# Patient Record
Sex: Male | Born: 1950 | Race: White | Hispanic: No | Marital: Single | Smoking: Current every day smoker
Health system: Southern US, Community
[De-identification: ages and names within clinical notes are randomized; demographics above are authoritative.]

## PROBLEM LIST (undated history)

## (undated) DIAGNOSIS — R109 Unspecified abdominal pain: Secondary | ICD-10-CM

## (undated) DIAGNOSIS — C259 Malignant neoplasm of pancreas, unspecified: Secondary | ICD-10-CM

## (undated) DIAGNOSIS — C801 Malignant (primary) neoplasm, unspecified: Secondary | ICD-10-CM

## (undated) DIAGNOSIS — R0600 Dyspnea, unspecified: Secondary | ICD-10-CM

## (undated) DIAGNOSIS — K8689 Other specified diseases of pancreas: Secondary | ICD-10-CM

## (undated) DIAGNOSIS — A150 Tuberculosis of lung: Secondary | ICD-10-CM

## (undated) DIAGNOSIS — R7303 Prediabetes: Secondary | ICD-10-CM

## (undated) HISTORY — PX: COLONOSCOPY W/ POLYPECTOMY: SHX1380

## (undated) HISTORY — DX: Malignant neoplasm of pancreas, unspecified: C25.9

## (undated) HISTORY — PX: LEG SURGERY: SHX1003

---

## 2008-06-07 ENCOUNTER — Emergency Department (HOSPITAL_COMMUNITY): Admission: EM | Admit: 2008-06-07 | Discharge: 2008-06-07 | Payer: Self-pay | Admitting: Emergency Medicine

## 2011-04-09 LAB — CBC
HCT: 39.7 % (ref 39.0–52.0)
Hemoglobin: 14 g/dL (ref 13.0–17.0)
MCHC: 35.4 g/dL (ref 30.0–36.0)
MCV: 97.6 fL (ref 78.0–100.0)
RBC: 4.06 MIL/uL — ABNORMAL LOW (ref 4.22–5.81)
WBC: 9.9 10*3/uL (ref 4.0–10.5)

## 2011-04-09 LAB — BASIC METABOLIC PANEL
CO2: 26 mEq/L (ref 19–32)
Calcium: 8.7 mg/dL (ref 8.4–10.5)
Chloride: 94 mEq/L — ABNORMAL LOW (ref 96–112)
Creatinine, Ser: 0.57 mg/dL (ref 0.4–1.5)
GFR calc Af Amer: >60 mL/min (ref >60)
Glucose, Bld: 103 mg/dL — ABNORMAL HIGH (ref 70–99)

## 2011-04-09 LAB — DIFFERENTIAL
Basophils Relative: 0 % (ref 0–1)
Eosinophils Absolute: 0 10*3/uL (ref 0.0–0.7)
Eosinophils Relative: 0 % (ref 0–5)
Lymphs Abs: 1.2 10*3/uL (ref 0.7–4.0)
Monocytes Absolute: 0.9 10*3/uL (ref 0.1–1.0)
Monocytes Relative: 9 % (ref 3–12)

## 2011-04-09 LAB — RAPID URINE DRUG SCREEN, HOSP PERFORMED
Barbiturates: NOT DETECTED
Benzodiazepines: NOT DETECTED

## 2012-03-23 ENCOUNTER — Emergency Department (HOSPITAL_COMMUNITY): Payer: Medicaid Other

## 2012-03-23 ENCOUNTER — Encounter (HOSPITAL_COMMUNITY): Payer: Self-pay | Admitting: *Deleted

## 2012-03-23 ENCOUNTER — Emergency Department (HOSPITAL_COMMUNITY)
Admission: EM | Admit: 2012-03-23 | Discharge: 2012-03-23 | Disposition: A | Discharge status: Home / Self Care | Payer: Medicaid Other | Attending: Internal Medicine | Admitting: Internal Medicine

## 2012-03-23 DIAGNOSIS — J438 Other emphysema: Secondary | ICD-10-CM | POA: Insufficient documentation

## 2012-03-23 DIAGNOSIS — R05 Cough: Secondary | ICD-10-CM | POA: Insufficient documentation

## 2012-03-23 DIAGNOSIS — R042 Hemoptysis: Secondary | ICD-10-CM | POA: Insufficient documentation

## 2012-03-23 DIAGNOSIS — R059 Cough, unspecified: Secondary | ICD-10-CM | POA: Insufficient documentation

## 2012-03-23 HISTORY — DX: Tuberculosis of lung: A15.0

## 2012-03-23 LAB — CBC WITH DIFFERENTIAL/PLATELET
Basophils Absolute: 0 10*3/uL (ref 0.0–0.1)
HCT: 40.7 % (ref 39.0–52.0)
Lymphocytes Relative: 20 % (ref 12–46)
Monocytes Absolute: 0.5 10*3/uL (ref 0.1–1.0)
Neutro Abs: 6.1 10*3/uL (ref 1.7–7.7)
RBC: 4.28 MIL/uL (ref 4.22–5.81)
RDW: 13.8 % (ref 11.5–15.5)

## 2012-03-23 LAB — COMPREHENSIVE METABOLIC PANEL
ALT: 15 U/L (ref 0–53)
AST: 25 U/L (ref 0–37)
Alkaline Phosphatase: 63 U/L (ref 39–117)
CO2: 25 mEq/L (ref 19–32)
Chloride: 100 mEq/L (ref 96–112)
Creatinine, Ser: 0.76 mg/dL (ref 0.50–1.35)
GFR calc non Af Amer: >90 mL/min (ref >90)
Potassium: 4.3 mEq/L (ref 3.5–5.1)
Total Bilirubin: 0.3 mg/dL (ref 0.3–1.2)

## 2012-03-23 MED ORDER — IOHEXOL 300 MG/ML  SOLN
80.0000 mL | Freq: Once | INTRAMUSCULAR | Status: AC | PRN
  Administered 2012-03-23: 80 mL via INTRAVENOUS

## 2012-03-23 NOTE — ED Notes (Signed)
Pt departure condition needs to be back timed to 1534.

## 2012-03-23 NOTE — ED Notes (Addendum)
Coughing up blood intermittently since yesterday morning. Hx of previous TB in 2012

## 2012-03-23 NOTE — ED Provider Notes (Addendum)
History   This chart was scribed for Nicholas Lennert, MD by Sofie Rower. The patient was seen in room APA07/APA07 and the patient's care was started at 10:36AM    CSN: [id number]  Arrival date & time 03/23/12  1001   First MD Initiated Contact with Patient 03/23/12 1036      Chief Complaint  Patient presents with  . Hemoptysis    (Consider location/radiation/quality/duration/timing/severity/associated sxs/prior treatment) Patient is a 61 y.o. male presenting with cough. The history is provided by the patient. No language interpreter was used.  Cough This is a new problem. The current episode started yesterday. The problem occurs every few hours. The problem has been gradually worsening. The cough is productive of bloody sputum. There has been no fever. Pertinent negatives include no chest pain, no sweats, no headaches, no sore throat and no myalgias. He has tried nothing for the symptoms. The treatment provided no relief. He is a smoker.   The pt reports the last episode of TB the pt experienced was May, 2012.   Past Medical History  Diagnosis Date  . TB (pulmonary tuberculosis)     Past Surgical History  Procedure Date  . Leg surgery     No family history on file.  History  Substance Use Topics  . Smoking status: Current Every Day Smoker    Types: Cigarettes  . Smokeless tobacco: Not on file  . Alcohol Use: Yes     beer at night      Review of Systems  Constitutional: Negative for fatigue.  HENT: Negative for congestion, sore throat, sinus pressure and ear discharge.   Eyes: Negative for discharge.  Respiratory: Positive for cough.   Cardiovascular: Negative for chest pain.  Gastrointestinal: Negative for abdominal pain and diarrhea.  Genitourinary: Negative for frequency and hematuria.  Musculoskeletal: Negative for myalgias and back pain.  Skin: Negative for rash.  Neurological: Negative for seizures and headaches.  Hematological: Negative.     Psychiatric/Behavioral: Negative for hallucinations.    Allergies  Review of patient's allergies indicates no known allergies.  Home Medications  No current outpatient prescriptions on file.  BP 150/79  Pulse 91  Temp 98.4 F (36.9 C) (Oral)  Resp 20  SpO2 100%  Physical Exam  Nursing note and vitals reviewed. Constitutional: He is oriented to person, place, and time. He appears well-developed.  HENT:  Head: Normocephalic and atraumatic.  Eyes: Conjunctivae normal and EOM are normal. No scleral icterus.  Neck: Neck supple. No thyromegaly present.  Cardiovascular: Normal rate and regular rhythm.  Exam reveals no gallop and no friction rub.   No murmur heard. Pulmonary/Chest: No stridor. He has no wheezes. He has no rales. He exhibits no tenderness.  Abdominal: He exhibits no distension. There is no tenderness. There is no rebound.  Musculoskeletal: Normal range of motion. He exhibits no edema.  Lymphadenopathy:    He has no cervical adenopathy.  Neurological: He is oriented to person, place, and time. Cranial nerve deficit:  no gross defecits noted. Coordination normal.  Skin: No rash noted. No erythema.  Psychiatric: He has a normal mood and affect. His behavior is normal.    ED Course  Procedures (including critical care time)  DIAGNOSTIC STUDIES: Oxygen Saturation is 100% on room air, normal by my interpretation.    COORDINATION OF CARE:   11:00AM- Treatment plan discussed with patient. Pt agrees with treatment.   Results for orders placed during the hospital encounter of 03/23/12  CBC WITH DIFFERENTIAL  Component Value Range   WBC 8.4  4.0 - 10.5 K/uL   RBC 4.28  4.22 - 5.81 MIL/uL   Hemoglobin 14.3  13.0 - 17.0 g/dL   HCT 45.4  09.8 - 11.9 %   MCV 95.1  78.0 - 100.0 fL   MCH 33.4  26.0 - 34.0 pg   MCHC 35.1  30.0 - 36.0 g/dL   RDW 14.7  82.9 - 56.2 %   Platelets 215  150 - 400 K/uL   Neutrophils Relative 73  43 - 77 %   Neutro Abs 6.1  1.7 - 7.7  K/uL   Lymphocytes Relative 20  12 - 46 %   Lymphs Abs 1.7  0.7 - 4.0 K/uL   Monocytes Relative 6  3 - 12 %   Monocytes Absolute 0.5  0.1 - 1.0 K/uL   Eosinophils Relative 1  0 - 5 %   Eosinophils Absolute 0.1  0.0 - 0.7 K/uL   Basophils Relative 0  0 - 1 %   Basophils Absolute 0.0  0.0 - 0.1 K/uL  COMPREHENSIVE METABOLIC PANEL      Component Value Range   Sodium 133 (*) 135 - 145 mEq/L   Potassium 4.3  3.5 - 5.1 mEq/L   Chloride 100  96 - 112 mEq/L   CO2 25  19 - 32 mEq/L   Glucose, Bld 134 (*) 70 - 99 mg/dL   BUN 11  6 - 23 mg/dL   Creatinine, Ser 1.30  0.50 - 1.35 mg/dL   Calcium 9.2  8.4 - 86.5 mg/dL   Total Protein 7.3  6.0 - 8.3 g/dL   Albumin 3.6  3.5 - 5.2 g/dL   AST 25  0 - 37 U/L   ALT 15  0 - 53 U/L   Alkaline Phosphatase 63  39 - 117 U/L   Total Bilirubin 0.3  0.3 - 1.2 mg/dL   GFR calc non Af Amer >90  >90 mL/min   GFR calc Af Amer >90  >90 mL/min   Dg Chest 2 View  03/23/2012  *RADIOLOGY REPORT*  Clinical Data: Cough, active TB two years ago, hemoptysis.  CHEST - 2 VIEW  Comparison: None.  Findings: Large spiculated left upper lobe opacity, some of which may reflect chronic fibrosis, active infection or mass not excluded.  Small nodular/spiculated right upper lobe opacity.  Lucency involving the left lower lung.  Cardiomediastinal silhouette is within normal limits.  Degenerative changes of the visualized thoracolumbar spine.  IMPRESSION: Large spiculated left upper lobe opacity, as described above.  Small nodular/spiculated right upper lobe opacity.  Correlate with pending CT.   Original Report Authenticated By: Charline Bills, M.D.    Ct Chest W Contrast  03/23/2012  *RADIOLOGY REPORT*  Clinical Data: Hemoptysis.  CT CHEST WITH CONTRAST  Technique:  Multidetector CT imaging of the chest was performed following the standard protocol during bolus administration of intravenous contrast.  Contrast: 80mL OMNIPAQUE IOHEXOL 300 MG/ML  SOLN  Comparison: Chest x-ray  03/23/2012.  Findings:  Mediastinum: Heart size is normal. There is no significant pericardial fluid, thickening or pericardial calcification. No pathologically enlarged mediastinal or hilar lymph nodes. Esophagus is unremarkable in appearance.  Mild atherosclerosis of the thoracic aorta and great vessels, without evidence of aneurysm or dissection.  Lungs/Pleura: There are patchy areas of peribronchovascular micro and macro nodularity scattered throughout the lungs bilaterally, most severe in the superior segment of the left lower lobe and the posterior aspect of the left upper  lobe where there is extensive architectural distortion and multiple calcifications.  In the superior segment of the left lower lobe there is a focal area of cylindrical bronchiectasis leading up to a large cavitary lesion measures approximately 3.6 x 2.6 cm (best demonstrated on image 23 of series 3).  No pleural effusions.  A background of moderate centrilobular emphysema and mild diffuse bronchial wall thickening.  Upper Abdomen: Unremarkable.  Musculoskeletal: There are no aggressive appearing lytic or blastic lesions noted in the visualized portions of the skeleton.  IMPRESSION: 1.  The appearance of the lungs is most compatible with an atypical infectious process, with differential considerations including both tuberculous and non-tuberculous mycobacterial infection.  There is also a large cavitary lesion in the superior segment left lower lobe with some bronchiectasis leading up to it, which may represent a source of bleeding in this patient with history of hemoptysis. Respiratory isolation and sputum testing for Mycobacterium tuberculosis is recommended at this time. 2.  Background of moderate centrilobular emphysema. 3.  Mild atherosclerosis.  These results were called by telephone on 03/23/2012 at 01:45 p.m. to Dr. Agapito Games, who verbally acknowledged these results.   Original Report Authenticated By: Florencia Reasons, M.D.        No diagnosis found.    MDM  Possible TB   Dr. Lendell Caprice to see pt                  The chart was scribed for me under my direct supervision.  I personally performed the history, physical, and medical decision making and all procedures in the evaluation of this patient.. Pt refused admission  Nicholas Lennert, MD 03/23/12 1409  Nicholas Lennert, MD 03/23/12 [phone]

## 2012-03-23 NOTE — ED Notes (Signed)
Public Health was notified of pt's decision to leave. Per Public Health nurse, Nicholas [name], sputum culture x1 was requested. Pt educated on sputum specimen process. Pt verbalized agreement.Pt agreed to stay for specimen collection. Specimen collected and sent to lab. Public Health nurse stated she would follow-up with pt and get second sputum culture. Pt was educated about wearing a mask at all times and staying at home. MD made aware of care plan and agreed with pt leaving AMA.

## 2012-03-23 NOTE — ED Notes (Signed)
EDP AWARE OF PT

## 2012-05-05 LAB — AFB CULTURE WITH SMEAR (NOT AT ARMC)

## 2012-09-26 ENCOUNTER — Encounter (HOSPITAL_COMMUNITY): Payer: Self-pay | Admitting: Emergency Medicine

## 2012-09-26 ENCOUNTER — Emergency Department (HOSPITAL_COMMUNITY): Payer: Non-veteran care

## 2012-09-26 ENCOUNTER — Emergency Department (HOSPITAL_COMMUNITY)
Admission: EM | Admit: 2012-09-26 | Discharge: 2012-09-26 | Disposition: A | Discharge status: Home / Self Care | Payer: Non-veteran care | Attending: Emergency Medicine | Admitting: Emergency Medicine

## 2012-09-26 DIAGNOSIS — Y9389 Activity, other specified: Secondary | ICD-10-CM | POA: Insufficient documentation

## 2012-09-26 DIAGNOSIS — F17200 Nicotine dependence, unspecified, uncomplicated: Secondary | ICD-10-CM | POA: Insufficient documentation

## 2012-09-26 DIAGNOSIS — Y92410 Unspecified street and highway as the place of occurrence of the external cause: Secondary | ICD-10-CM | POA: Insufficient documentation

## 2012-09-26 DIAGNOSIS — S92919A Unspecified fracture of unspecified toe(s), initial encounter for closed fracture: Secondary | ICD-10-CM | POA: Insufficient documentation

## 2012-09-26 DIAGNOSIS — S92412A Displaced fracture of proximal phalanx of left great toe, initial encounter for closed fracture: Secondary | ICD-10-CM

## 2012-09-26 MED ORDER — HYDROCODONE-ACETAMINOPHEN 5-325 MG PO TABS: ORAL_TABLET | ORAL | Status: DC

## 2012-09-26 MED ORDER — NAPROXEN 500 MG PO TABS: 500.0000 mg | ORAL_TABLET | Freq: Two times a day (BID) | ORAL | Status: DC
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
## 2012-09-29 NOTE — ED Provider Notes (Signed)
Medical screening examination/treatment/procedure(s) were performed by non-physician practitioner and as supervising physician I was immediately available for consultation/collaboration.   Karyna Bessler, MD 09/29/12 1524 

## 2012-10-05 ENCOUNTER — Ambulatory Visit: Payer: Medicaid Other | Admitting: Orthopedic Surgery

## 2012-10-09 ENCOUNTER — Ambulatory Visit: Payer: Medicaid Other | Admitting: Orthopedic Surgery

## 2012-10-09 ENCOUNTER — Telehealth: Payer: Self-pay | Admitting: Orthopedic Surgery
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
## 2013-01-19 ENCOUNTER — Emergency Department (HOSPITAL_COMMUNITY)
Admission: EM | Admit: 2013-01-19 | Discharge: 2013-01-19 | Disposition: A | Discharge status: Home / Self Care | Payer: Non-veteran care | Attending: Emergency Medicine | Admitting: Emergency Medicine

## 2013-01-19 ENCOUNTER — Encounter (HOSPITAL_COMMUNITY): Payer: Self-pay | Admitting: *Deleted

## 2013-01-19 DIAGNOSIS — W57XXXA Bitten or stung by nonvenomous insect and other nonvenomous arthropods, initial encounter: Secondary | ICD-10-CM | POA: Insufficient documentation

## 2013-01-19 DIAGNOSIS — R21 Rash and other nonspecific skin eruption: Secondary | ICD-10-CM

## 2013-01-19 DIAGNOSIS — S30860A Insect bite (nonvenomous) of lower back and pelvis, initial encounter: Secondary | ICD-10-CM | POA: Insufficient documentation

## 2013-01-19 DIAGNOSIS — Y939 Activity, unspecified: Secondary | ICD-10-CM | POA: Insufficient documentation

## 2013-01-19 DIAGNOSIS — Z8611 Personal history of tuberculosis: Secondary | ICD-10-CM | POA: Insufficient documentation

## 2013-01-19 DIAGNOSIS — F17200 Nicotine dependence, unspecified, uncomplicated: Secondary | ICD-10-CM | POA: Insufficient documentation

## 2013-01-19 DIAGNOSIS — Y929 Unspecified place or not applicable: Secondary | ICD-10-CM | POA: Insufficient documentation

## 2013-01-19 MED ORDER — ACYCLOVIR 400 MG PO TABS: 400.0000 mg | ORAL_TABLET | Freq: Three times a day (TID) | ORAL | Status: DC

## 2013-01-19 MED ORDER — DOXYCYCLINE HYCLATE 100 MG PO CAPS: 100.0000 mg | ORAL_CAPSULE | Freq: Two times a day (BID) | ORAL | Status: DC

## 2013-01-19 NOTE — ED Notes (Signed)
Tick removed from testicles 2 weeks ago.  One week ago developed rash in same area.  Used polysporin on site w/out any noticeable improvement.  Puritic and burning.

## 2013-01-19 NOTE — ED Provider Notes (Signed)
History  This chart was scribed for Nicholas Hutching, MD, by Yevette Edwards, ED Scribe. This patient was seen in room APA09/APA09 and the patient's care was started at 7:29 AM.  CSN: [id number] Arrival date & time 01/19/13  4098  First MD Initiated Contact with Patient 01/19/13 [phone]     Chief Complaint  Patient presents with  . Rash  . Insect Bite    The history is provided by the patient. No language interpreter was used.   HPI Comments: Nicholas [name] is a 62 y.o. male who presents to the Emergency Department complaining of a rash to his penis and scrotum which began one week ago. The pt reports removing a tick from his testicles two weeks ago, and he states the area around the bite has been constantly sore.  The pt also reports engaging in recent sexual intercourse using a condom. He has used polysporin without any noticeable improvement. The pt has a h/o of TB. He is a daily smoker, and he reports using alcohol.   Past Medical History  Diagnosis Date  . TB (pulmonary tuberculosis)    Past Surgical History  Procedure Laterality Date  . Leg surgery     History reviewed. No pertinent family history. History  Substance Use Topics  . Smoking status: Current Every Day Smoker -- 0.50 packs/day    Types: Cigarettes  . Smokeless tobacco: Not on file  . Alcohol Use: Yes     Comment: beer at night    Review of Systems A complete 10 system review of systems was obtained, and all systems were negative except where indicated in the HPI and PE.   Allergies  Review of patient's allergies indicates no known allergies.  Home Medications   Current Outpatient Rx  Name  Route  Sig  Dispense  Refill  . HYDROcodone-acetaminophen (NORCO/VICODIN) 5-325 MG per tablet      Take one-two tabs po q 4-6 hrs prn pain   20 tablet   0   . hydroxypropyl methylcellulose (ISOPTO TEARS) 2.5 % ophthalmic solution   Both Eyes   Place 1 drop into both eyes daily as needed (Eye Irritation).          . naproxen (NAPROSYN) 500 MG tablet   Oral   Take 1 tablet (500 mg total) by mouth 2 (two) times daily with a meal.   20 tablet   0    Triage Vitals: BP 142/82  Pulse 88  Temp(Src) 98 F (36.7 C)  Resp 15  Ht 5\' 11"  (1.803 m)  Wt 140 lb (63.504 kg)  BMI 19.53 kg/m2  SpO2 98%  Physical Exam  Nursing note and vitals reviewed. Constitutional: He is oriented to person, place, and time. He appears well-developed and well-nourished. No distress.  HENT:  Head: Normocephalic and atraumatic.  Eyes: Conjunctivae and EOM are normal. Pupils are equal, round, and reactive to light.  Neck: Normal range of motion. Neck supple. No tracheal deviation present.  Cardiovascular: Normal rate, regular rhythm and normal heart sounds.   Pulmonary/Chest: Effort normal and breath sounds normal. No respiratory distress.  Abdominal: Soft. Bowel sounds are normal.  Genitourinary: Penile erythema and penile tenderness present.  Proximal posterior shaft of penis and the proximal central portion of scrotum, there was erythematous, macerated, tender ulcerations.  The remainder of his scrotum and testicles were normal.   Musculoskeletal: Normal range of motion.  Neurological: He is alert and oriented to person, place, and time.  Skin: Skin is warm and  dry. There is erythema.  Psychiatric: He has a normal mood and affect. His behavior is normal.    ED Course  Procedures (including critical care time)  COORDINATION OF CARE:  7:48 AM- Discussed treatment plan with patient which includes a culture, an antiviral for suspected Herpes, and antibiotics. The patient agreed to the plan.   DIAGNOSTIC STUDIES:  Oxygen Saturation is 98% on room air, normal by my interpretation.    Labs Reviewed - No data to display No results found. No diagnosis found.  MDM  Lesions could be herpetic. There is also a cellulitic component. Herpes culture obtained. Rx doxycycline 100 mg twice a day for 10 days and acyclovir 400  mg 3 times a day for 10 days   I personally performed the services described in this documentation, which was scribed in my presence. The recorded information has been reviewed and is accurate.    Nicholas Hutching, MD 01/19/13 [phone]

## 2013-01-19 NOTE — Discharge Instructions (Signed)
Keep your genitalia clean and dry.   Do not use Neosporin ointment.   Prescription for antibiotic and antiviral.    We have drawn a herpes culture today.    If results are positive, you will be called

## 2013-01-22 LAB — HERPES SIMPLEX VIRUS CULTURE: Culture: NOT DETECTED

## 2013-02-09 ENCOUNTER — Encounter (HOSPITAL_COMMUNITY): Payer: Self-pay | Admitting: Emergency Medicine

## 2013-02-09 ENCOUNTER — Emergency Department (HOSPITAL_COMMUNITY)
Admission: EM | Admit: 2013-02-09 | Discharge: 2013-02-09 | Disposition: A | Discharge status: Home / Self Care | Payer: Medicaid Other | Attending: Emergency Medicine | Admitting: Emergency Medicine

## 2013-02-09 ENCOUNTER — Emergency Department (HOSPITAL_COMMUNITY): Payer: Medicaid Other

## 2013-02-09 DIAGNOSIS — F17200 Nicotine dependence, unspecified, uncomplicated: Secondary | ICD-10-CM | POA: Insufficient documentation

## 2013-02-09 DIAGNOSIS — R5381 Other malaise: Secondary | ICD-10-CM | POA: Insufficient documentation

## 2013-02-09 DIAGNOSIS — M79609 Pain in unspecified limb: Secondary | ICD-10-CM | POA: Insufficient documentation

## 2013-02-09 DIAGNOSIS — E860 Dehydration: Secondary | ICD-10-CM | POA: Insufficient documentation

## 2013-02-09 DIAGNOSIS — R002 Palpitations: Secondary | ICD-10-CM | POA: Insufficient documentation

## 2013-02-09 DIAGNOSIS — R42 Dizziness and giddiness: Secondary | ICD-10-CM | POA: Insufficient documentation

## 2013-02-09 DIAGNOSIS — IMO0001 Reserved for inherently not codable concepts without codable children: Secondary | ICD-10-CM | POA: Insufficient documentation

## 2013-02-09 DIAGNOSIS — R110 Nausea: Secondary | ICD-10-CM | POA: Insufficient documentation

## 2013-02-09 DIAGNOSIS — R5383 Other fatigue: Secondary | ICD-10-CM | POA: Insufficient documentation

## 2013-02-09 DIAGNOSIS — Z8619 Personal history of other infectious and parasitic diseases: Secondary | ICD-10-CM | POA: Insufficient documentation

## 2013-02-09 LAB — CBC WITH DIFFERENTIAL/PLATELET
Basophils Absolute: 0 10*3/uL (ref 0.0–0.1)
Basophils Relative: 0 % (ref 0–1)
Eosinophils Absolute: 0 10*3/uL (ref 0.0–0.7)
Eosinophils Relative: 0 % (ref 0–5)
HCT: 40.4 % (ref 39.0–52.0)
MCHC: 34.9 g/dL (ref 30.0–36.0)
MCV: 97.3 fL (ref 78.0–100.0)
Monocytes Absolute: 0.7 10*3/uL (ref 0.1–1.0)
RDW: 13.4 % (ref 11.5–15.5)

## 2013-02-09 LAB — POCT I-STAT, CHEM 8
Calcium, Ion: 1.12 mmol/L (ref 1.12–1.23)
HCT: 45 % (ref 39.0–52.0)
TCO2: 26 mmol/L (ref 0–100)

## 2013-02-09 LAB — POCT I-STAT TROPONIN I: Troponin i, poc: 0 ng/mL (ref 0.00–0.08)

## 2013-02-09 MED ORDER — SODIUM CHLORIDE 0.9 % IV BOLUS (SEPSIS)
1000.0000 mL | Freq: Once | INTRAVENOUS | Status: AC
  Administered 2013-02-09: 1000 mL via INTRAVENOUS

## 2013-02-09 NOTE — ED Notes (Signed)
Instructions reviewed and f/u information provided.  Verbalizes understanding; left in c/o friends for transport home.

## 2013-02-09 NOTE — ED Notes (Signed)
Pt c/o feeling like bp is elevated since 0500 this am. C/o flushed feeling in face/muscle cramping in arms/legs and dizziness. nad noted.

## 2013-02-09 NOTE — ED Provider Notes (Signed)
CSN: [id number]     Arrival date & time 02/09/13  4098 History  This chart was scribed for Nicholas Bucco, MD by Bennett Scrape, ED Scribe. This patient was seen in room APA17/APA17 and the patient's care was started at 10:00 AM.   Chief Complaint  Patient presents with  . Hypertension    The history is provided by the patient. No language interpreter was used.    HPI Comments: Nicholas [name] is a 62 y.o. male who presents to the Emergency Department complaining of gradual onset, gradually worsening, constant bilateral hand and feet cramping with associated nausea  palpitations described as a racing feeling, feeling flushed and lightheadedness. He states that he noted them upon leaving home around 5 AM this morning and they have since worsened upon starting work. 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 Pt denies having prior episodes of similar symptoms. He reports that movement improves the symptoms and standing or sitting still worsens the symptoms. He states that currently he is experiencing mild bilateral hand and feet cramps and fatigue. He denies having a h/o cardiac issues. He denies abdominal pain or emesis as associated symptoms. He also states that he feels his BP has been elevated since the onset. He has a h/o HTN and denies being on any HTN medications currently. BP is 134/87 in the ED. BP at home is unknown.  PCP is with [facility]   Past Medical History  Diagnosis Date  . TB (pulmonary tuberculosis)    Past Surgical History  Procedure Laterality Date  . Leg surgery     History reviewed. No pertinent family history. History  Substance Use Topics  . Smoking status: Current Every Day Smoker -- 0.50 packs/day    Types: Cigarettes  . Smokeless tobacco: Not on file  . Alcohol Use: Yes     Comment: beer at night    Review of Systems  Constitutional: Positive for fatigue. Negative for fever, chills and diaphoresis.  HENT: Negative for  congestion, rhinorrhea and sneezing.   Eyes: Negative.   Respiratory: Negative for cough, chest tightness and shortness of breath.   Cardiovascular: Negative for chest pain and leg swelling.  Gastrointestinal: Positive for nausea. Negative for vomiting, abdominal pain, diarrhea and blood in stool.  Genitourinary: Negative for frequency, hematuria, flank pain and difficulty urinating.  Musculoskeletal: Positive for myalgias. Negative for back pain and arthralgias.  Skin: Negative for rash.  Neurological: Positive for light-headedness. Negative for dizziness, speech difficulty, weakness, numbness and headaches.  All other systems reviewed and are negative.    Allergies  Review of patient's allergies indicates no known allergies.  Home Medications   Current Outpatient Rx  Name  Route  Sig  Dispense  Refill  . doxycycline (VIBRAMYCIN) 100 MG capsule   Oral   Take 1 capsule (100 mg total) by mouth 2 (two) times daily. One po bid x 7 days   20 capsule   0    Triage Vitals: BP 156/96  Pulse 109  Temp(Src) 98.4 F (36.9 C) (Oral)  SpO2 97%  Physical Exam  Nursing note and vitals reviewed. Constitutional: He is oriented to person, place, and time. He appears well-developed and well-nourished.  HENT:  Head: Normocephalic and atraumatic.  Flushed in the face  Eyes: Pupils are equal, round, and reactive to light.  Neck: Normal range of motion. Neck supple.  Cardiovascular: Normal rate, regular rhythm and normal heart sounds.   Pulmonary/Chest: Effort normal and breath sounds normal.  No respiratory distress. He has no wheezes. He has no rales. He exhibits no tenderness.  Abdominal: Soft. Bowel sounds are normal. There is no tenderness. There is no rebound and no guarding.  Musculoskeletal: Normal range of motion. He exhibits no edema.  Lymphadenopathy:    He has no cervical adenopathy.  Neurological: He is alert and oriented to person, place, and time. He has normal strength. No  cranial nerve deficit or sensory deficit. GCS eye subscore is 4. GCS verbal subscore is 5. GCS motor subscore is 6.  FTN intact.  No pronator drift  Skin: Skin is warm and dry. No rash noted.  Psychiatric: He has a normal mood and affect.    ED Course   Procedures (including critical care time)  Medications  sodium chloride 0.9 % bolus 1,000 mL (1,000 mLs Intravenous New Bag/Given 02/09/13 1215)  sodium chloride 0.9 % bolus 1,000 mL (0 mLs Intravenous Stopped 02/09/13 1210)    Date: 02/09/2013  Rate: 87  Rhythm: normal sinus rhythm  QRS Axis: normal  Intervals: normal  ST/T Wave abnormalities: nonspecific T wave changes, hyperacute t waves  Conduction Disutrbances:none  Narrative Interpretation:   Old EKG Reviewed: none available   Date: 02/09/2013  Rate: 77  Rhythm: normal sinus rhythm  QRS Axis: normal  Intervals: normal  ST/T Wave abnormalities: nonspecific ST/T changes  Conduction Disutrbances:none  Narrative Interpretation:   Old EKG Reviewed: unchanged   DIAGNOSTIC STUDIES: Oxygen Saturation is 97% on room air, normal by my interpretation.    COORDINATION OF CARE: 10:05 AM-Discussed treatment plan which includes CBC panel, i-stat troponin and i-Stat chem 8 with pt at bedside and pt agreed to plan.   10:29 AM-Pt reports bilateral facial tightness to the ED tech. Upon re-questioning, he reports mild blurred vision, aphasia described as stumbling over words and weakness behind bilateral knees but denies numbness. Upon re-exam, finger to nose is intact bilaterally, no pronator's drift, sensation and motor are intact throughout.   12;49 PM-Observed pt ambulate from the bathroom to his room without difficulty. Informed pt of lab results, negative radiology results and mild abnormalities on EKG. Pt now admits that these symptoms are similar to prior panic attacks. Pt declined second troponin testing. Advised pt to follow up with PCP and pt agreed. Addressed symptoms to return  for with pt.   Results for orders placed during the hospital encounter of 02/09/13  CBC WITH DIFFERENTIAL      Result Value Range   WBC 9.3  4.0 - 10.5 K/uL   RBC 4.15 (*) 4.22 - 5.81 MIL/uL   Hemoglobin 14.1  13.0 - 17.0 g/dL   HCT 40.9  81.1 - 91.4 %   MCV 97.3  78.0 - 100.0 fL   MCH 34.0  26.0 - 34.0 pg   MCHC 34.9  30.0 - 36.0 g/dL   RDW 78.2  95.6 - 21.3 %   Platelets 241  150 - 400 K/uL   Neutrophils Relative % 75  43 - 77 %   Neutro Abs 7.0  1.7 - 7.7 K/uL   Lymphocytes Relative 18  12 - 46 %   Lymphs Abs 1.6  0.7 - 4.0 K/uL   Monocytes Relative 7  3 - 12 %   Monocytes Absolute 0.7  0.1 - 1.0 K/uL   Eosinophils Relative 0  0 - 5 %   Eosinophils Absolute 0.0  0.0 - 0.7 K/uL   Basophils Relative 0  0 - 1 %   Basophils Absolute 0.0  0.0 - 0.1 K/uL  POCT I-STAT, CHEM 8      Result Value Range   Sodium 133 (*) 135 - 145 mEq/L   Potassium 4.0  3.5 - 5.1 mEq/L   Chloride 98  96 - 112 mEq/L   BUN 3 (*) 6 - 23 mg/dL   Creatinine, Ser 4.09  0.50 - 1.35 mg/dL   Glucose, Bld 811 (*) 70 - 99 mg/dL   Calcium, Ion 9.14  7.82 - 1.23 mmol/L   TCO2 26  0 - 100 mmol/L   Hemoglobin 15.3  13.0 - 17.0 g/dL   HCT 95.6  21.3 - 08.6 %  POCT I-STAT TROPONIN I      Result Value Range   Troponin i, poc 0.00  0.00 - 0.08 ng/mL   Comment 3            Ct Head Wo Contrast  02/09/2013   *RADIOLOGY REPORT*  Clinical Data: Dizziness  CT HEAD WITHOUT CONTRAST  Technique:  Contiguous axial images were obtained from the base of the skull through the vertex without contrast.  Comparison: 06/07/2008  Findings: The bony calvarium is intact.  Mucosal retention cysts are noted within the maxillary antra bilaterally.  The ventricles and cortical sulci are within normal limits for the patient's given age.  No findings to suggest acute hemorrhage, acute infarction or space-occupying mass lesion are identified.  IMPRESSION: No acute abnormalities seen.   Original Report Authenticated By: Alcide Clever, M.D.    1.  Dehydration   2. Dizziness     MDM  Patient presents with lightheadedness associated with facial flushing and near syncope. He denied any heart palpitations or chest pain. His EKG did show hyperacute T waves but his troponins negative. He has no unilateral symptoms suggestive of stroke. He was orthostatic and this could represent some dehydration with vasovagal effect. Given his hyperacute T waves I did want to keep the patient here and do a second troponin but he is refusing to stay. He was ambulating in the ED and says he feels much better after IV fluids. He does not on the stay any longer even know I did suggest to him that he had an abnormal EKG and I felt he needed to stay for a second troponin. He is adamant about being discharged and says that he will followup with his primary care physician. Advised to return if his symptoms worsen. I did do 2 EKGs which did not show any progressive changes.  I personally performed the services described in this documentation, which was scribed in my presence.  The recorded information has been reviewed and considered.      Nicholas Bucco, MD 02/09/13 1257

## 2013-02-09 NOTE — ED Notes (Signed)
Pt complains of dizziness while orthostatics. Pt sat down before standing bp was complete.

## 2014-01-10 ENCOUNTER — Emergency Department (HOSPITAL_COMMUNITY)
Admission: EM | Admit: 2014-01-10 | Discharge: 2014-01-10 | Disposition: A | Discharge status: Home / Self Care | Payer: Medicaid Other | Attending: Emergency Medicine | Admitting: Emergency Medicine

## 2014-01-10 ENCOUNTER — Encounter (HOSPITAL_COMMUNITY): Payer: Self-pay | Admitting: Emergency Medicine

## 2014-01-10 ENCOUNTER — Emergency Department (HOSPITAL_COMMUNITY): Payer: Medicaid Other

## 2014-01-10 DIAGNOSIS — Z79899 Other long term (current) drug therapy: Secondary | ICD-10-CM | POA: Diagnosis not present

## 2014-01-10 DIAGNOSIS — Z7982 Long term (current) use of aspirin: Secondary | ICD-10-CM | POA: Diagnosis not present

## 2014-01-10 DIAGNOSIS — F17200 Nicotine dependence, unspecified, uncomplicated: Secondary | ICD-10-CM | POA: Diagnosis not present

## 2014-01-10 DIAGNOSIS — R071 Chest pain on breathing: Secondary | ICD-10-CM | POA: Insufficient documentation

## 2014-01-10 DIAGNOSIS — R0789 Other chest pain: Secondary | ICD-10-CM

## 2014-01-10 DIAGNOSIS — Z8611 Personal history of tuberculosis: Secondary | ICD-10-CM | POA: Insufficient documentation

## 2014-01-10 DIAGNOSIS — R079 Chest pain, unspecified: Secondary | ICD-10-CM | POA: Diagnosis present

## 2014-01-10 MED ORDER — ACETAMINOPHEN 500 MG PO TABS
1000.0000 mg | ORAL_TABLET | Freq: Once | ORAL | Status: DC
  Filled 2014-01-10: qty 2

## 2014-01-10 MED ORDER — IBUPROFEN 800 MG PO TABS
800.0000 mg | ORAL_TABLET | Freq: Once | ORAL | Status: DC
  Filled 2014-01-10: qty 2

## 2014-01-10 MED ORDER — CYCLOBENZAPRINE HCL 10 MG PO TABS: 10.0000 mg | ORAL_TABLET | Freq: Two times a day (BID) | ORAL | Status: DC | PRN

## 2014-01-10 MED ORDER — HYDROCODONE-ACETAMINOPHEN 5-325 MG PO TABS: 2.0000 | ORAL_TABLET | ORAL | Status: DC | PRN

## 2014-01-10 NOTE — Discharge Instructions (Signed)
Chest Wall Pain Chest wall pain is pain felt in or around the chest bones and muscles. It may take up to 6 weeks to get better. It may take longer if you are active. Chest wall pain can happen on its own. Other times, things like germs, injury, coughing, or exercise can cause the pain. HOME CARE   Avoid activities that make you tired or cause pain. Try not to use your chest, belly (abdominal), or side muscles. Do not use heavy weights.  Put ice on the sore area.  Put ice in a plastic bag.  Place a towel between your skin and the bag.  Leave the ice on for 15-20 minutes for the first 2 days.  Only take medicine as told by your doctor. GET HELP RIGHT AWAY IF:   You have more pain or are very uncomfortable.  You have a fever.  Your chest pain gets worse.  You have new problems.  You feel sick to your stomach (nauseous) or throw up (vomit).  You start to sweat or feel lightheaded.  You have a cough with mucus (phlegm).  You cough up blood. MAKE SURE YOU:   Understand these instructions.  Will watch your condition.  Will get help right away if you are not doing well or get worse. Document Released: 12/08/2007 Document Revised: 09/13/2011 Document Reviewed: 02/15/2011 [facility] Patient Information 2015 [place], [place]. This information is not intended to replace advice given to you by your health care provider. Make sure you discuss any questions you have with your health care provider.  Medication for pain and relaxation. Followup at the [facility] system for your lung problemms
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
## 2014-11-29 ENCOUNTER — Encounter (HOSPITAL_COMMUNITY): Payer: Self-pay | Admitting: *Deleted

## 2014-11-29 ENCOUNTER — Emergency Department (HOSPITAL_COMMUNITY): Payer: Medicaid Other

## 2014-11-29 ENCOUNTER — Emergency Department (HOSPITAL_COMMUNITY)
Admission: EM | Admit: 2014-11-29 | Discharge: 2014-11-29 | Disposition: A | Discharge status: Home / Self Care | Payer: Medicaid Other | Attending: Emergency Medicine | Admitting: Emergency Medicine

## 2014-11-29 DIAGNOSIS — R0789 Other chest pain: Secondary | ICD-10-CM

## 2014-11-29 DIAGNOSIS — Y9289 Other specified places as the place of occurrence of the external cause: Secondary | ICD-10-CM | POA: Insufficient documentation

## 2014-11-29 DIAGNOSIS — Z79899 Other long term (current) drug therapy: Secondary | ICD-10-CM | POA: Insufficient documentation

## 2014-11-29 DIAGNOSIS — Y998 Other external cause status: Secondary | ICD-10-CM | POA: Insufficient documentation

## 2014-11-29 DIAGNOSIS — Y9389 Activity, other specified: Secondary | ICD-10-CM | POA: Diagnosis not present

## 2014-11-29 DIAGNOSIS — W01198A Fall on same level from slipping, tripping and stumbling with subsequent striking against other object, initial encounter: Secondary | ICD-10-CM | POA: Diagnosis not present

## 2014-11-29 DIAGNOSIS — Z8611 Personal history of tuberculosis: Secondary | ICD-10-CM | POA: Insufficient documentation

## 2014-11-29 DIAGNOSIS — S299XXA Unspecified injury of thorax, initial encounter: Secondary | ICD-10-CM | POA: Insufficient documentation

## 2014-11-29 DIAGNOSIS — Z7982 Long term (current) use of aspirin: Secondary | ICD-10-CM | POA: Diagnosis not present

## 2014-11-29 DIAGNOSIS — Z720 Tobacco use: Secondary | ICD-10-CM | POA: Insufficient documentation

## 2014-11-29 MED ORDER — KETOROLAC TROMETHAMINE 30 MG/ML IJ SOLN
30.0000 mg | Freq: Once | INTRAMUSCULAR | Status: AC
  Administered 2014-11-29: 30 mg via INTRAMUSCULAR
  Filled 2014-11-29: qty 1

## 2014-11-29 MED ORDER — HYDROMORPHONE HCL 1 MG/ML IJ SOLN
1.0000 mg | Freq: Once | INTRAMUSCULAR | Status: DC
  Filled 2014-11-29: qty 1

## 2014-11-29 MED ORDER — HYDROMORPHONE HCL 1 MG/ML IJ SOLN
1.0000 mg | Freq: Once | INTRAMUSCULAR | Status: AC
  Administered 2014-11-29: 1 mg via INTRAMUSCULAR

## 2014-11-29 MED ORDER — IBUPROFEN 600 MG PO TABS: 600.0000 mg | ORAL_TABLET | Freq: Four times a day (QID) | ORAL | Status: DC | PRN

## 2014-11-29 MED ORDER — TRAMADOL HCL 50 MG PO TABS: 50.0000 mg | ORAL_TABLET | Freq: Four times a day (QID) | ORAL | Status: DC | PRN

## 2014-11-29 NOTE — Discharge Instructions (Signed)

## 2014-11-29 NOTE — ED Notes (Signed)
Pt tripped over a cord and fell across cabinet on rt side last night at approx 2200, pt co front/right sided rib pain, pt states he is unable to take full breath or cough.

## 2014-11-29 NOTE — ED Provider Notes (Signed)
CSN: [id number]     Arrival date & time 11/29/14  0701 History   First MD Initiated Contact with Patient 11/29/14 [phone]     Chief Complaint  Patient presents with  . Rib Injury    rt side     (Consider location/radiation/quality/duration/timing/severity/associated sxs/prior Treatment) HPI   63yM with R lateral CP. Pt had mechanical fall yesterday and struck this area against a cabinet. Persistent pain since. Worse this morning. Movement and deep breathing exacerbate it. No SOB. Denies pain elsewhere. No intervention prior to arrival.  Past Medical History  Diagnosis Date  . TB (pulmonary tuberculosis)    Past Surgical History  Procedure Laterality Date  . Leg surgery     History reviewed. No pertinent family history. History  Substance Use Topics  . Smoking status: Current Every Day Smoker -- 0.50 packs/day    Types: Cigarettes  . Smokeless tobacco: Not on file  . Alcohol Use: Yes     Comment: beer at night    Review of Systems  All systems reviewed and negative, other than as noted in HPI.   Allergies  Review of patient's allergies indicates no known allergies.  Home Medications   Prior to Admission medications   Medication Sig Start Date End Date Taking? Authorizing Provider  aspirin EC 325 MG tablet Take 325 mg by mouth daily.    Historical Provider, MD  cyclobenzaprine (FLEXERIL) 10 MG tablet Take 1 tablet (10 mg total) by mouth 2 (two) times daily as needed for muscle spasms. 01/10/14   [name], MD  HYDROcodone-acetaminophen (NORCO) 5-325 MG per tablet Take 2 tablets by mouth every 4 (four) hours as needed. 01/10/14   [name], MD  HYDROcodone-acetaminophen (NORCO/VICODIN) 5-325 MG per tablet Take 1 tablet by mouth every 6 (six) hours as needed for moderate pain.    Historical Provider, MD  ibuprofen (ADVIL,MOTRIN) 200 MG tablet Take 800 mg by mouth every 6 (six) hours as needed for moderate pain.    Historical Provider, MD  Multiple Vitamins-Minerals  (MULTIVITAMINS THER. W/MINERALS) TABS tablet Take 1 tablet by mouth daily.    Historical Provider, MD   BP 126/73 mmHg  Pulse 78  Temp(Src) 97.9 F (36.6 C) (Oral)  Resp 30  Ht 5\' 11"  (1.803 m)  Wt 130 lb (58.968 kg)  BMI 18.14 kg/m2  SpO2 95% Physical Exam  Constitutional: He appears well-developed and well-nourished. No distress.  HENT:  Head: Normocephalic and atraumatic.  Eyes: Conjunctivae are normal. Right eye exhibits no discharge. Left eye exhibits no discharge.  Neck: Neck supple.  Cardiovascular: Normal rate, regular rhythm and normal heart sounds.  Exam reveals no gallop and no friction rub.   No murmur heard. Pulmonary/Chest: Effort normal and breath sounds normal. No respiratory distress. He exhibits tenderness.  Chest wall, abdomen and back normal to inspection. TTP R lateral chest. No crepitus.   Abdominal: Soft. He exhibits no distension. There is no tenderness.  Musculoskeletal: He exhibits no edema or tenderness.  No midline spinal tenderness  Neurological: He is alert.  Skin: Skin is warm and dry.  Psychiatric: He has a normal mood and affect. His behavior is normal. Thought content normal.  Nursing note and vitals reviewed.   ED Course  Procedures (including critical care time) Labs Review Labs Reviewed - No data to display  Imaging Review No results found.   Dg Ribs Unilateral W/chest Right  11/29/2014   CLINICAL DATA:  Right lateral chest pain after falling last night. History of tuberculosis.  EXAM: RIGHT RIBS AND CHEST - 3+ VIEW  COMPARISON:  01/10/2014 radiographs.  CT 03/23/2012.  FINDINGS: There is no evidence of acute right-sided rib fracture, pleural effusion or pneumothorax. The heart size and mediastinal contours are stable with superior retraction of the left hilum. Again demonstrated are extensive chronic changes related to the patient's tuberculosis with multiple nodular densities in both lungs. Confluent component with cavitation in the left  upper lobe is again noted.  IMPRESSION: 1. No evidence of right-sided rib fracture, pleural effusion or pneumothorax. 2. Stable extensive pulmonary findings related to underlying tuberculosis.   Electronically Signed   By: Richardean Sale M.D.   On: 11/29/2014 08:01    EKG Interpretation None      MDM   Final diagnoses:  Right-sided chest wall pain    63yM with R chest wall pain. Negative imaging. No respiratory distress. Breathing under 20/min on my exam. Symptomatic tx.     Virgel Manifold, MD 12/03/14 [phone]

## 2014-12-25 ENCOUNTER — Emergency Department (HOSPITAL_COMMUNITY)
Admission: EM | Admit: 2014-12-25 | Discharge: 2014-12-25 | Disposition: A | Discharge status: Home / Self Care | Payer: Medicaid Other | Attending: Emergency Medicine | Admitting: Emergency Medicine

## 2014-12-25 ENCOUNTER — Emergency Department (HOSPITAL_COMMUNITY): Payer: Medicaid Other

## 2014-12-25 ENCOUNTER — Encounter (HOSPITAL_COMMUNITY): Payer: Self-pay

## 2014-12-25 DIAGNOSIS — Z8611 Personal history of tuberculosis: Secondary | ICD-10-CM | POA: Diagnosis not present

## 2014-12-25 DIAGNOSIS — S2231XD Fracture of one rib, right side, subsequent encounter for fracture with routine healing: Secondary | ICD-10-CM | POA: Diagnosis not present

## 2014-12-25 DIAGNOSIS — Z79899 Other long term (current) drug therapy: Secondary | ICD-10-CM | POA: Diagnosis not present

## 2014-12-25 DIAGNOSIS — Z7982 Long term (current) use of aspirin: Secondary | ICD-10-CM | POA: Diagnosis not present

## 2014-12-25 DIAGNOSIS — W1839XD Other fall on same level, subsequent encounter: Secondary | ICD-10-CM | POA: Insufficient documentation

## 2014-12-25 DIAGNOSIS — Z720 Tobacco use: Secondary | ICD-10-CM | POA: Diagnosis not present

## 2014-12-25 DIAGNOSIS — S29001D Unspecified injury of muscle and tendon of front wall of thorax, subsequent encounter: Secondary | ICD-10-CM | POA: Diagnosis present

## 2014-12-25 MED ORDER — HYDROCODONE-ACETAMINOPHEN 5-325 MG PO TABS: 1.0000 | ORAL_TABLET | Freq: Four times a day (QID) | ORAL | Status: DC | PRN

## 2014-12-25 NOTE — ED Notes (Signed)
Pt reports fell a week ago and was told he had bruised his ribs.  Pt says the pain is no better and has started having a productive cough with yellow sputum.   Denies fever.

## 2014-12-25 NOTE — ED Provider Notes (Signed)
CSN: [id number]     Arrival date & time 12/25/14  0915 History   First MD Initiated Contact with Patient 12/25/14 [phone]     Chief Complaint  Patient presents with  . rib pain      (Consider location/radiation/quality/duration/timing/severity/associated sxs/prior Treatment) HPI Comments: Pt comes in with c/o right sided rip pain. Pt state that he fell and was told that he had bruised ribs. He states that the pain hadn't gotten any better and now it it getting worse. He states that he now has a cough with yellow sputum. No fever.  The history is provided by the patient. No language interpreter was used.    Past Medical History  Diagnosis Date  . TB (pulmonary tuberculosis)    Past Surgical History  Procedure Laterality Date  . Leg surgery     No family history on file. History  Substance Use Topics  . Smoking status: Current Every Day Smoker -- 0.50 packs/day    Types: Cigarettes  . Smokeless tobacco: Not on file  . Alcohol Use: Yes     Comment: beer at night    Review of Systems  All other systems reviewed and are negative.     Allergies  Review of patient's allergies indicates no known allergies.  Home Medications   Prior to Admission medications   Medication Sig Start Date End Date Taking? Authorizing Provider  aspirin EC 325 MG tablet Take 325 mg by mouth daily.    Historical Provider, MD  cyclobenzaprine (FLEXERIL) 10 MG tablet Take 1 tablet (10 mg total) by mouth 2 (two) times daily as needed for muscle spasms. 01/10/14   [name], MD  HYDROcodone-acetaminophen (NORCO) 5-325 MG per tablet Take 2 tablets by mouth every 4 (four) hours as needed. 01/10/14   [name], MD  HYDROcodone-acetaminophen (NORCO/VICODIN) 5-325 MG per tablet Take 1 tablet by mouth every 6 (six) hours as needed for moderate pain.    Historical Provider, MD  ibuprofen (ADVIL,MOTRIN) 200 MG tablet Take 800 mg by mouth every 6 (six) hours as needed for moderate pain.    Historical Provider, MD   ibuprofen (ADVIL,MOTRIN) 600 MG tablet Take 1 tablet (600 mg total) by mouth every 6 (six) hours as needed. 11/29/14   Virgel Manifold, MD  Multiple Vitamins-Minerals (MULTIVITAMINS THER. W/MINERALS) TABS tablet Take 1 tablet by mouth daily.    Historical Provider, MD  traMADol (ULTRAM) 50 MG tablet Take 1 tablet (50 mg total) by mouth every 6 (six) hours as needed. 11/29/14   Virgel Manifold, MD   BP 133/77 mmHg  Pulse 100  Temp(Src) 98.3 F (36.8 C) (Oral)  Resp 22  Ht 5\' 6"  (1.676 m)  Wt 130 lb (58.968 kg)  BMI 20.99 kg/m2  SpO2 100% Physical Exam  Constitutional: He appears well-developed.  HENT:  Head: Normocephalic and atraumatic.  Right Ear: External ear normal.  Left Ear: External ear normal.  Eyes: Pupils are equal, round, and reactive to light.  Neck: Normal range of motion. Neck supple.  Cardiovascular: Normal rate and regular rhythm.   Pulmonary/Chest: Effort normal and breath sounds normal. He exhibits tenderness.  Tender on the right lower anterior ribs  Musculoskeletal: Normal range of motion.  Neurological: He is alert.  Skin: Skin is warm and dry. No rash noted.  Psychiatric: He has a normal mood and affect.  Nursing note and vitals reviewed.   ED Course  Procedures (including critical care time) Labs Review Labs Reviewed - No data to display  Imaging  Review Dg Ribs Unilateral W/chest Right  12/25/2014   CLINICAL DATA:  Persistent right-sided rib pain after fall 1 month ago, now with productive cough.  EXAM: RIGHT RIBS AND CHEST - 3+ VIEW  COMPARISON:  11/29/2014.  01/10/2014.  FINDINGS: Right upper lobe pleural-parenchymal scarring is stable since the study from 1 year ago. Parenchymal scarring is also seen in the right upper lobe. Acute to subacute fractures of the right posterior eighth and ninth ribs noted no evidence for pneumothorax or pleural effusion. Bones are diffusely demineralized.  IMPRESSION: Hyperexpansion with bilateral upper lobe, left greater than  right, scarring in this patient with a reported previous history of TB.  Acute to subacute fractures of the right posterior lateral eighth and ninth ribs.   Electronically Signed   By: Misty Stanley M.D.   On: 12/25/2014 10:26     EKG Interpretation None      MDM   Final diagnoses:  Rib fracture, right, with routine healing, subsequent encounter    Rib fractures noted. No infection noted. Will treat with hydrocodone for pain. Pt given return precautions    Glendell Docker, NP 12/25/14 [place], MD 12/26/14 [phone]

## 2015-10-04 DIAGNOSIS — K8689 Other specified diseases of pancreas: Secondary | ICD-10-CM

## 2015-10-04 HISTORY — DX: Other specified diseases of pancreas: K86.89

## 2015-11-11 ENCOUNTER — Encounter (HOSPITAL_COMMUNITY): Payer: Self-pay | Admitting: Emergency Medicine

## 2015-11-11 ENCOUNTER — Observation Stay (HOSPITAL_COMMUNITY)
Admission: EM | Admit: 2015-11-11 | Discharge: 2015-11-13 | Disposition: A | Discharge status: Home / Self Care | Payer: Medicaid Other | Attending: Emergency Medicine | Admitting: Emergency Medicine

## 2015-11-11 ENCOUNTER — Emergency Department (HOSPITAL_COMMUNITY): Payer: Medicaid Other

## 2015-11-11 DIAGNOSIS — Z7982 Long term (current) use of aspirin: Secondary | ICD-10-CM | POA: Diagnosis not present

## 2015-11-11 DIAGNOSIS — E871 Hypo-osmolality and hyponatremia: Secondary | ICD-10-CM

## 2015-11-11 DIAGNOSIS — F17210 Nicotine dependence, cigarettes, uncomplicated: Secondary | ICD-10-CM | POA: Diagnosis not present

## 2015-11-11 DIAGNOSIS — K8689 Other specified diseases of pancreas: Secondary | ICD-10-CM

## 2015-11-11 DIAGNOSIS — K869 Disease of pancreas, unspecified: Principal | ICD-10-CM | POA: Insufficient documentation

## 2015-11-11 DIAGNOSIS — R1011 Right upper quadrant pain: Secondary | ICD-10-CM | POA: Diagnosis present

## 2015-11-11 DIAGNOSIS — F17200 Nicotine dependence, unspecified, uncomplicated: Secondary | ICD-10-CM

## 2015-11-11 LAB — URINALYSIS, ROUTINE W REFLEX MICROSCOPIC
BILIRUBIN URINE: NEGATIVE
Glucose, UA: NEGATIVE mg/dL
Ketones, ur: NEGATIVE mg/dL
Leukocytes, UA: NEGATIVE
NITRITE: NEGATIVE
PROTEIN: NEGATIVE mg/dL
SPECIFIC GRAVITY, URINE: 1.01 (ref 1.005–1.030)
pH: 5.5 (ref 5.0–8.0)

## 2015-11-11 LAB — CBC WITH DIFFERENTIAL/PLATELET
Band Neutrophils: 0 %
Basophils Absolute: 0 10*3/uL (ref 0.0–0.1)
Basophils Relative: 0 %
EOS ABS: 0.2 10*3/uL (ref 0.0–0.7)
EOS PCT: 1 %
HCT: 39.3 % (ref 39.0–52.0)
Hemoglobin: 13.9 g/dL (ref 13.0–17.0)
LYMPHS ABS: 2 10*3/uL (ref 0.7–4.0)
LYMPHS PCT: 22 %
MCH: 34.4 pg — AB (ref 26.0–34.0)
MCHC: 35.4 g/dL (ref 30.0–36.0)
MCV: 97.3 fL (ref 78.0–100.0)
MONO ABS: 1 10*3/uL (ref 0.1–1.0)
MONOS PCT: 8 %
NEUTROS PCT: 69 %
Neutro Abs: 5.7 10*3/uL (ref 1.7–7.7)
PLATELETS: 222 10*3/uL (ref 150–400)
RBC: 4.04 MIL/uL — ABNORMAL LOW (ref 4.22–5.81)
RDW: 12.5 % (ref 11.5–15.5)
WBC: 8.3 10*3/uL (ref 4.0–10.5)

## 2015-11-11 LAB — COMPREHENSIVE METABOLIC PANEL
ALT: 18 U/L (ref 17–63)
ANION GAP: 10 (ref 5–15)
AST: 25 U/L (ref 15–41)
Albumin: 3.8 g/dL (ref 3.5–5.0)
Alkaline Phosphatase: 68 U/L (ref 38–126)
BUN: 7 mg/dL (ref 6–20)
CHLORIDE: 93 mmol/L — AB (ref 101–111)
CO2: 23 mmol/L (ref 22–32)
CREATININE: 0.48 mg/dL — AB (ref 0.61–1.24)
Calcium: 8.6 mg/dL — ABNORMAL LOW (ref 8.9–10.3)
Glucose, Bld: 95 mg/dL (ref 65–99)
Potassium: 3.8 mmol/L (ref 3.5–5.1)
SODIUM: 126 mmol/L — AB (ref 135–145)
Total Bilirubin: 0.3 mg/dL (ref 0.3–1.2)
Total Protein: 7.5 g/dL (ref 6.5–8.1)

## 2015-11-11 LAB — LIPASE, BLOOD: LIPASE: 85 U/L — AB (ref 11–51)

## 2015-11-11 LAB — URINE MICROSCOPIC-ADD ON

## 2015-11-11 LAB — POC OCCULT BLOOD, ED: Fecal Occult Bld: NEGATIVE

## 2015-11-11 LAB — TROPONIN I

## 2015-11-11 MED ORDER — FENTANYL CITRATE (PF) 100 MCG/2ML IJ SOLN
100.0000 ug | Freq: Once | INTRAMUSCULAR | Status: AC
  Administered 2015-11-11: 100 ug via INTRAVENOUS
  Filled 2015-11-11: qty 2

## 2015-11-11 MED ORDER — IOPAMIDOL (ISOVUE-300) INJECTION 61%
100.0000 mL | Freq: Once | INTRAVENOUS | Status: AC | PRN
  Administered 2015-11-11: 100 mL via INTRAVENOUS

## 2015-11-11 MED ORDER — DIATRIZOATE MEGLUMINE & SODIUM 66-10 % PO SOLN
ORAL | Status: AC
  Filled 2015-11-11: qty 30

## 2015-11-11 MED ORDER — ONDANSETRON HCL 4 MG/2ML IJ SOLN
4.0000 mg | Freq: Once | INTRAMUSCULAR | Status: AC
  Administered 2015-11-11: 4 mg via INTRAVENOUS
  Filled 2015-11-11: qty 2

## 2015-11-11 NOTE — ED Notes (Signed)
Pt reports having abd pain intermittently for the past three weeks, pain started today and increased in intensity. C/o bilateral abd and back pain, denies N/V/D/., fever, and dysuria.

## 2015-11-11 NOTE — ED Notes (Signed)
Per triage nurse: pt stated that he hasn't drank ETOH in awhile but that he tried to have a beer today and that's when his complaints started today.

## 2015-11-12 ENCOUNTER — Encounter (HOSPITAL_COMMUNITY): Payer: Self-pay | Admitting: Internal Medicine

## 2015-11-12 DIAGNOSIS — E871 Hypo-osmolality and hyponatremia: Secondary | ICD-10-CM

## 2015-11-12 DIAGNOSIS — F17200 Nicotine dependence, unspecified, uncomplicated: Secondary | ICD-10-CM

## 2015-11-12 DIAGNOSIS — K869 Disease of pancreas, unspecified: Secondary | ICD-10-CM

## 2015-11-12 LAB — COMPREHENSIVE METABOLIC PANEL
ALBUMIN: 3.6 g/dL (ref 3.5–5.0)
ALT: 15 U/L — ABNORMAL LOW (ref 17–63)
ANION GAP: 9 (ref 5–15)
AST: 21 U/L (ref 15–41)
Alkaline Phosphatase: 62 U/L (ref 38–126)
BILIRUBIN TOTAL: 0.6 mg/dL (ref 0.3–1.2)
BUN: 7 mg/dL (ref 6–20)
CO2: 24 mmol/L (ref 22–32)
Calcium: 8.7 mg/dL — ABNORMAL LOW (ref 8.9–10.3)
Chloride: 96 mmol/L — ABNORMAL LOW (ref 101–111)
Creatinine, Ser: 0.49 mg/dL — ABNORMAL LOW (ref 0.61–1.24)
GFR calc Af Amer: >60 mL/min (ref >60)
Glucose, Bld: 101 mg/dL — ABNORMAL HIGH (ref 65–99)
POTASSIUM: 4.1 mmol/L (ref 3.5–5.1)
Sodium: 129 mmol/L — ABNORMAL LOW (ref 135–145)
TOTAL PROTEIN: 7 g/dL (ref 6.5–8.1)

## 2015-11-12 LAB — PROTIME-INR
INR: 1.06 (ref 0.00–1.49)
PROTHROMBIN TIME: 14 s (ref 11.6–15.2)

## 2015-11-12 LAB — APTT: APTT: 27 s (ref 24–37)

## 2015-11-12 LAB — OSMOLALITY, URINE: Osmolality, Ur: 457 mOsm/kg (ref 300–900)

## 2015-11-12 LAB — SODIUM, URINE, RANDOM: SODIUM UR: 29 mmol/L

## 2015-11-12 LAB — CBC
HEMATOCRIT: 38.2 % — AB (ref 39.0–52.0)
HEMOGLOBIN: 13.4 g/dL (ref 13.0–17.0)
MCH: 34.4 pg — ABNORMAL HIGH (ref 26.0–34.0)
MCHC: 35.1 g/dL (ref 30.0–36.0)
MCV: 98.2 fL (ref 78.0–100.0)
Platelets: 235 10*3/uL (ref 150–400)
RBC: 3.89 MIL/uL — ABNORMAL LOW (ref 4.22–5.81)
RDW: 12.6 % (ref 11.5–15.5)
WBC: 7.4 10*3/uL (ref 4.0–10.5)

## 2015-11-12 LAB — CORTISOL: Cortisol, Plasma: 9.4 ug/dL

## 2015-11-12 LAB — TSH: TSH: 1.435 u[IU]/mL (ref 0.350–4.500)

## 2015-11-12 MED ORDER — PANTOPRAZOLE SODIUM 40 MG PO TBEC
40.0000 mg | DELAYED_RELEASE_TABLET | Freq: Every day | ORAL | Status: DC
  Administered 2015-11-12 – 2015-11-13 (×2): 40 mg via ORAL
  Filled 2015-11-12 (×2): qty 1

## 2015-11-12 MED ORDER — ACETAMINOPHEN 325 MG PO TABS
650.0000 mg | ORAL_TABLET | Freq: Four times a day (QID) | ORAL | Status: DC | PRN
  Administered 2015-11-12: 650 mg via ORAL
  Filled 2015-11-12: qty 2

## 2015-11-12 MED ORDER — SODIUM CHLORIDE 0.9 % IV SOLN
INTRAVENOUS | Status: AC
Start: 2015-11-12 — End: 2015-11-13
  Administered 2015-11-12: 04:00:00 via INTRAVENOUS

## 2015-11-12 MED ORDER — HYDROCODONE-ACETAMINOPHEN 5-325 MG PO TABS
1.0000 | ORAL_TABLET | Freq: Four times a day (QID) | ORAL | Status: DC | PRN
  Administered 2015-11-12 – 2015-11-13 (×4): 2 via ORAL
  Filled 2015-11-12 (×4): qty 2

## 2015-11-12 MED ORDER — ACETAMINOPHEN 650 MG RE SUPP: 650.0000 mg | Freq: Four times a day (QID) | RECTAL | Status: DC | PRN

## 2015-11-12 MED ORDER — ONDANSETRON HCL 4 MG/2ML IJ SOLN: 4.0000 mg | Freq: Four times a day (QID) | INTRAMUSCULAR | Status: DC | PRN

## 2015-11-12 MED ORDER — ENOXAPARIN SODIUM 40 MG/0.4ML ~~LOC~~ SOLN: 40.0000 mg | SUBCUTANEOUS | Status: DC

## 2015-11-12 NOTE — H&P (Signed)
TRH H&P   Patient Demographics:    Nicholas [name], is a 65 y.o. male  MRN: [id number]   DOB - 01/18/1951  Admit Date - 11/11/2015  Outpatient Primary MD for the patient is [facility]  Referring MD/NP/PA: Dr. Wilson Singer  Outpatient Specialists:   Patient coming from: home  Chief Complaint  Patient presents with  . Abdominal Pain      HPI:    Sukhraj Oke  is a 65 y.o. male, with c/o abdominal discomfort radiating to the back for the past 2 weeks.  Denies fever, chills, wt loss, n/v, diarrhea, brbpr, black stool.  Pt presented to the ED for evaluation and was found to have a 4.7cm pancreatic mass.  Pt will be admitted for w/up of pancreatic mass and hyponatremia.     Review of systems:    In addition to the HPI above,  No Fever-chills, No Headache, No changes with Vision or hearing, No problems swallowing food or Liquids, No Chest pain, Cough or Shortness of Breath, + Abdominal pain, No Nausea or Vommitting, Bowel movements are regular, No Blood in stool or Urine, No dysuria, No new skin rashes or bruises, No new joints pains-aches,  No new weakness, tingling, numbness in any extremity, No recent weight gain or loss, No polyuria, polydypsia or polyphagia, No significant Mental Stressors.  A full 10 point Review of Systems was done, except as stated above, all other Review of Systems were negative.   With Past History of the following :    Past Medical History  Diagnosis Date  . TB (pulmonary tuberculosis)       Past Surgical History  Procedure Laterality Date  . Leg surgery        Social History:     Social History  Substance Use Topics  . Smoking status: Current Every Day Smoker -- 0.50 packs/day for 30 years    Types: Cigarettes  . Smokeless tobacco: Not on file  . Alcohol Use: 0.0 oz/week    0 Standard drinks or equivalent per week   Comment: beer at night     Lives - at home  Mobility - ambulatory    Family History :     Family History  Problem Relation Age of Onset  . Pneumonia Father   . Stroke Mother       Home Medications:   Prior to Admission medications   Medication Sig Start Date End Date Taking? Authorizing Provider  aspirin EC 81 MG tablet Take 81 mg by mouth daily.   Yes Historical Provider, MD  Multiple Vitamins-Minerals (MULTIVITAMINS THER. W/MINERALS) TABS tablet Take 1 tablet by mouth daily.   Yes Historical Provider, MD     Allergies:    No Known Allergies   Physical Exam:   Vitals  Blood pressure 164/70, pulse 78, temperature 98.2 F (36.8 C), temperature source Oral, resp. rate 16, height 5'  11" (1.803 m), weight 63.504 kg (140 lb), SpO2 97 %.   1. General elderly white male, NAD  2. Normal affect and insight, Not Suicidal or Homicidal, Awake Alert, Oriented X 3.  3. No F.N deficits, ALL C.Nerves Intact, Strength 5/5 all 4 extremities, Sensation intact all 4 extremities, Plantars down going.  4. Ears and Eyes appear Normal, Conjunctivae clear, PERRLA. Moist Oral Mucosa.  5. Supple Neck, No JVD, No cervical lymphadenopathy appriciated, No Carotid Bruits.  6. Symmetrical Chest wall movement, Good air movement bilaterally, CTAB.  7. RRR, No Gallops, Rubs or Murmurs, No Parasternal Heave.  8. Positive Bowel Sounds, Abdomen Soft, No tenderness, No organomegaly appriciated,No rebound -guarding or rigidity.  9.  No Cyanosis, Normal Skin Turgor, No Skin Rash or Bruise.  10. Good muscle tone,  joints appear normal , no effusions, Normal ROM.  11. No Palpable Lymph Nodes in Neck or Axillae     Data Review:    CBC  Recent Labs Lab 11/11/15 2240  WBC 8.3  HGB 13.9  HCT 39.3  PLT 222  MCV 97.3  MCH 34.4*  MCHC 35.4  RDW 12.5  LYMPHSABS 2.0  MONOABS 1.0  EOSABS 0.2  BASOSABS 0    ------------------------------------------------------------------------------------------------------------------  Chemistries   Recent Labs Lab 11/11/15 2240  NA 126*  K 3.8  CL 93*  CO2 23  GLUCOSE 95  BUN 7  CREATININE 0.48*  CALCIUM 8.6*  AST 25  ALT 18  ALKPHOS 68  BILITOT 0.3   ------------------------------------------------------------------------------------------------------------------ estimated creatinine clearance is 86 mL/min (by C-G formula based on Cr of 0.48). ------------------------------------------------------------------------------------------------------------------ No results for input(s): TSH, T4TOTAL, T3FREE, THYROIDAB in the last 72 hours.  Invalid input(s): FREET3  Coagulation profile No results for input(s): INR, PROTIME in the last 168 hours. ------------------------------------------------------------------------------------------------------------------- No results for input(s): DDIMER in the last 72 hours. -------------------------------------------------------------------------------------------------------------------  Cardiac Enzymes  Recent Labs Lab 11/11/15 2240  TROPONINI <0.03   ------------------------------------------------------------------------------------------------------------------ No results found for: BNP   ---------------------------------------------------------------------------------------------------------------  Urinalysis    Component Value Date/Time   COLORURINE YELLOW 11/11/2015 2244   APPEARANCEUR CLEAR 11/11/2015 2244   LABSPEC 1.010 11/11/2015 2244   PHURINE 5.5 11/11/2015 2244   GLUCOSEU NEGATIVE 11/11/2015 2244   HGBUR TRACE* 11/11/2015 2244   BILIRUBINUR NEGATIVE 11/11/2015 2244   KETONESUR NEGATIVE 11/11/2015 2244   PROTEINUR NEGATIVE 11/11/2015 2244   NITRITE NEGATIVE 11/11/2015 2244   LEUKOCYTESUR NEGATIVE 11/11/2015 2244     ----------------------------------------------------------------------------------------------------------------   Imaging Results:    Ct Abdomen Pelvis W Contrast  11/12/2015  CLINICAL DATA:  Intermittent abdominal pain for 3 weeks, worse today. EXAM: CT ABDOMEN AND PELVIS WITH CONTRAST TECHNIQUE: Multidetector CT imaging of the abdomen and pelvis was performed using the standard protocol following bolus administration of intravenous contrast. CONTRAST:  13mL ISOVUE-300 IOPAMIDOL (ISOVUE-300) INJECTION 61% COMPARISON:  None. FINDINGS: There is an irregular hypodense 4.7 cm mass involving the body of the pancreas just to the left of midline. 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 It encases the splenic artery and has a large zone of contact with the celiac axis. There also is contact with the superior mesenteric artery although it is not encased. There is deformity and narrowing of the confluence of the superior mesenteric vein and portal vein, with narrowing of the superior mesenteric vein. This is seen to best advantage on coronal image 27 series 3. There may also be involvement of the distal duodenum at the ligament of  Treitz, suggested on sagittal image 60 series 4 and coronal image 29 series 3. There is chronic ductal dilatation and atrophy of the pancreatic tail. The pancreatic head is normal in appearance. No hepatic metastases are evident. There is mild generalized fatty infiltration of the liver. Multiple small nodes are present in the upper abdomen. There are normal appearances of the adrenals and kidneys. Abdominal aorta is normal in caliber and heavily calcified. Bowel is unremarkable. There is no significant abnormality in the lower chest. There is no significant skeletal lesion. IMPRESSION: 4.7 cm mass of the pancreas just to the left of midline, likely a primary malignancy. Involvement of several vascular structures, also  possibly the distal duodenum. These results were called by telephone at the time of interpretation on 11/12/2015 at 12:19 am to Dr. Lily Kocher , who verbally acknowledged these results. Electronically Signed   By: Andreas Newport M.D.   On: 11/12/2015 00:30      Assessment & Plan:    Active Problems:   Pancreatic mass   Hyponatremia    1. Pancreatic mass Check CA 19-9 Gi consultation ordered Consider EUS vs CT guided biopsy  2.  Hyponatremia Check cortisol, tsh, serum osm, urine sodium urine osm Hydrate gently with ns iv Check cmp in the am  3. Tobacco dependence Pt counselled on smoking cessation nicotline patch declined.    DVT Prophylaxis Heparin -  Lovenox  AM Labs Ordered, also please review Full Orders  Family Communication: Admission, patients condition and plan of care including tests being ordered have been discussed with the patient who indicate understanding and agree with the plan and Code Status.  Code Status FULL CODE  Likely DC to  home  Condition GUARDED    Consults called:  Please call GI in am  Admission status: inpatient  Time spent in minutes : 40 minutes   Jani Gravel M.D on 11/12/2015 at 1:12 AM [phone]  Between 7am to 7pm - Pager - [phone]. After 7pm go to [email or web address] - password [email or web address]  [facility] - Office  [phone]

## 2015-11-12 NOTE — ED Provider Notes (Signed)
Patient reports diffuse abdominal pain off and on for the past several weeks. He states sometimes it is made worse with eating but other times it isn't. He also reports he is started getting back pain with the abdominal pain that is pretty intense. He states the pain has gotten worse over the past 24 hours where he finally couldn't stand it anymore. He denies any weight loss, nausea, vomiting, or diarrhea.  Patient is a tall thin male who is alert and cooperative.  He looks older than his stated age. Abdominal exam deferred to PA.    Medical screening examination/treatment/procedure(s) were conducted as a shared visit with non-physician practitioner(s) and myself.  I personally evaluated the patient during the encounter.     Rolland Porter, MD, Barbette Or, MD 11/12/15 [phone]

## 2015-11-12 NOTE — Progress Notes (Signed)
PROGRESS NOTE  Nicholas [name] [id number] DOB: [birth year] DOA: 11/11/2015 PCP: PROVIDER NOT IN SYSTEM  Brief Narrative: 53 yom presented with abd discomfort for the past 2 weeks. While in the ED, he was found to have a 4.7 cm pancreatic mass. He was admitted for further workup of pancreatic mass and hyponatremia.   Assessment/Plan: 1. Pancreatic mass. CT abd showed 4.7 cm mass of pancreas, likely a primary malignancy. CA antigen 19-9 in process.  2. Hyponatremia. Asymptomatic. No headache, nausea or vomiting. Sodium 129. TSH wnl. He reports poor recent PO intake.  3. Tobacco use disorder. Pt declined a nicotine patch.    Overall improved. Advance diet. Pain controlled at this point. Continue IV fluids for hyponatremia.  Follow up with cortisol, serum osm, urine osm, and urine sodium labs.   Anticipate discharge tomorrow pending improved serum sodium.  Outpatient endoscopic ultrasound to obtain biopsy.  DVT prophylaxis: Lovenox Code Status: Full Family Communication: Discussed with patient. No family present at bedside. Disposition Plan: discharge likely tomorrow.  Murray Hodgkins, MD  [facility] Direct contact:  --Via amion app OR  --[email or web address]; password [email or web address] and click  [email or web address] contact night coverage as above 11/12/2015, 4:21 PM  LOS: 0 days   Consultants:  GI  Procedures:  none  Antimicrobials:  none  HPI/Subjective: Reports some slight right-sided abdominal pain. Denies any nausea or vomiting. Hungry, wants food.  Objective: Filed Vitals:   11/12/15 0200 11/12/15 0215 11/12/15 0300 11/12/15 1455  BP:  120/81 154/78 125/74  Pulse: 89  80 84  Temp:   97.8 F (36.6 C) 98 F (36.7 C)  TempSrc:   Oral Oral  Resp:   18 18  Height:   5\' 11"  (1.803 m)   Weight:   52.39 kg (115 lb 8 oz)   SpO2:   96% 99%    Intake/Output Summary (Last 24 hours) at 11/12/15 1621 Last data filed at 11/12/15 1455  Gross per 24 hour  Intake      0 ml  Output     550 ml  Net   -550 ml     Filed Weights   11/11/15 2045 11/12/15 0300  Weight: 63.504 kg (140 lb) 52.39 kg (115 lb 8 oz)    Exam:    Constitutional:  . Appears calm and comfortable Respiratory:  . CTA bilaterally, no w/r/r.  . Respiratory effort normal. No retractions or accessory muscle use Cardiovascular:  . RRR, no m/r/g . No LE extremity edema   Abdomen:  . Abdomen appears normal; no tenderness or masses . No hernias . No epigastric pain Psychiatric:  . judgement and insight appear normal . Mental status o Mood, affect appropriate  I have personally reviewed following labs and imaging studies:  Sodium 129  LFT unremarkable  CBC unremarkable  CT abdomen/pelvis noted  Scheduled Meds: . pantoprazole  40 mg Oral Daily   Continuous Infusions: . sodium chloride 75 mL/hr at 11/12/15 0347    Principal Problem:   Hyponatremia Active Problems:   Pancreatic mass   Tobacco use disorder   LOS: 0 days   Time spent 25 minutes  By signing my name below, I, Delene Ruffini, attest that this documentation has been prepared under the direction and in the presence of Kelten Enochs P. Sarajane Jews, MD. Electronically Signed: Delene Ruffini, Scribe.  11/12/2015 12:05pm   I personally performed the services described in this documentation. All medical record entries made by the scribe were at my direction. I have reviewed the chart and  agree that the record reflects my personal performance and is accurate and complete. Murray Hodgkins, MD

## 2015-11-12 NOTE — Consult Note (Signed)
Referring Provider: Murray Hodgkins, MD Primary Care Physician:  PROVIDER NOT IN SYSTEM Primary Gastroenterologist:  Dr. Laural Golden  Reason for Consultation:    Pancreatic mass.  HPI:   Patient is 65 year old Caucasian male who was in usual state of health until about 3 weeks ago when he developed upper abdominal pain and diarrhea and he thought he had food poisoning. Diarrhea has resolved that pain has continued intermittently. At times he has severe cramping across his upper abdomen radiating to his back. He has noted anorexia and weight loss. He states he has lost 25 pounds since his office visit at [facility] months ago. He believes most of the weight loss occurred in the last 2 months or so. He has not experienced nausea vomiting fever chills night sweats melena or rectal bleeding dysuria or hematuria. Ration came to emergency room and was noted to have hyponatremia and CT revealed pancreatic body mass and patient was therefore hospitalized for pain control and treatment of hyponatremia. 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 His cortisol level is normal. Patient denies history of pancreatitis. He drinks alcohol every day but no more than 1 or 2 cans of beer. He's been drinking alcohol since he was 65 years old. He also smokes half a pack of cigarettes per day. Review of the systems is positive for right knee pain. He states he has a floating bone fragment in this joint. She also complains of bilateral shoulder pain has been diagnosed with rotator cuff tear. He received physical therapy while at [facility] dated did not help much. He has been offered surgical treatment but file decision has not been made.   Past Medical History  Diagnosis Date  . TB (pulmonary tuberculosis)He was treated about 4 years ago. He received total of 12 months of therapy. He states he was quarantined at home for 6 months.          History of colonic adenomas. Last colonoscopy  was 5 years ago. He is supposed to have follow-up exam this year.       History of low back pain secondary to prolapsed disc diagnosed in 1995.        Past Surgical History  Procedure Laterality Date  . Leg surgery about 6 years ago for some type of skin cancer       Prior to Admission medications   Medication Sig Start Date End Date Taking? Authorizing Provider  aspirin EC 81 MG tablet Take 81 mg by mouth daily.   Yes Historical Provider, MD  Multiple Vitamins-Minerals (MULTIVITAMINS THER. W/MINERALS) TABS tablet Take 1 tablet by mouth daily.   Yes Historical Provider, MD    Current Facility-Administered Medications  Medication Dose Route Frequency Provider Last Rate Last Dose  . 0.9 %  sodium chloride infusion   Intravenous Continuous Jani Gravel, MD 75 mL/hr at 11/12/15 0347    . acetaminophen (TYLENOL) tablet 650 mg  650 mg Oral Q6H PRN Jani Gravel, MD   650 mg at 11/12/15 0347   Or  . acetaminophen (TYLENOL) suppository 650 mg  650 mg Rectal Q6H PRN Jani Gravel, MD      . enoxaparin (LOVENOX) injection 40 mg  40 mg Subcutaneous Q24H Jani Gravel, MD   40 mg at 11/12/15 0435  . HYDROcodone-acetaminophen (NORCO/VICODIN) 5-325 MG per tablet 1-2 tablet  1-2 tablet Oral Q6H PRN Samuella Cota, MD   2 tablet at 11/12/15 1527  . ondansetron (ZOFRAN) injection 4 mg  4 mg Intravenous Q6H PRN Jani Gravel, MD      . pantoprazole (PROTONIX) EC tablet 40 mg  40 mg Oral Daily Jani Gravel, MD   40 mg at 11/12/15 1246    Allergies as of 11/11/2015  . (No Known Allergies)    Family History  Problem Relation Age of Onset   Father died of pneumonia in his 66s.      Mother is 76 and in rehabilitation center in [place]      He has 1 sister age 85 in good health.       Social History   He has never married. He does not have any children. He's been smoking cigarettes since he was 65 years old. He used to smoke 1 pack a day and now has cut back to half a pack a day. He drinks 1-2 cans of beer daily  which he has done for over 40 years. He graduated from [employer] back and 89 with history) medical size major. He served in [employer] for 4 years. 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     Review of Systems: See HPI, otherwise normal ROS  Physical Exam: Temp:  [97.8 F (36.6 C)-98.2 F (36.8 C)] 98 F (36.7 C) (05/10 1455) Pulse Rate:  [78-89] 84 (05/10 1455) Resp:  [16-20] 18 (05/10 1455) BP: (108-164)/(63-92) 125/74 mmHg (05/10 1455) SpO2:  [94 %-99 %] 99 % (05/10 1455) Weight:  [115 lb 8 oz (52.39 kg)-140 lb (63.504 kg)] 115 lb 8 oz (52.39 kg) (05/10 0300) Last BM Date: 11/11/15 Well-developed thin Caucasian male who appears older than stated age. He appears to be in some pain. Conjunctiva was pink. Sclerae nonicteric. Oropharyngeal mucosa is normal. Dentition in poor condition. No neck masses or thyromegaly noted. Cardiac exam with regular rhythm normal S1 and S2. No murmur or gallop noted. Auscultation of lungs reveals scattered rhonchi. Abdomen is symmetrical. Bowel sounds are normal. On palpation abdomen is soft with mild to moderate midepigastric tenderness but no organomegaly or masses. Extremities are thin but no clubbing or edema noted.   Lab Results:  Recent Labs  11/11/15 2240 11/12/15 0513  WBC 8.3 7.4  HGB 13.9 13.4  HCT 39.3 38.2*  PLT 222 235   BMET  Recent Labs  11/11/15 2240 11/12/15 0513  NA 126* 129*  K 3.8 4.1  CL 93* 96*  CO2 23 24  GLUCOSE 95 101*  BUN 7 7  CREATININE 0.48* 0.49*  CALCIUM 8.6* 8.7*   LFT  Recent Labs  11/12/15 0513  PROT 7.0  ALBUMIN 3.6  AST 21  ALT 15*  ALKPHOS 62  BILITOT 0.6   PT/INR  Recent Labs  11/12/15 0513  LABPROT 14.0  INR 1.06   CA-19-9 is pending.  Studies/Results: Ct Abdomen Pelvis W Contrast  11/12/2015  CLINICAL DATA:  Intermittent abdominal pain for 3 weeks, worse  today. EXAM: CT ABDOMEN AND PELVIS WITH CONTRAST TECHNIQUE: Multidetector CT imaging of the abdomen and pelvis was performed using the standard protocol following bolus administration of intravenous contrast. CONTRAST:  17mL ISOVUE-300 IOPAMIDOL (ISOVUE-300) INJECTION 61% COMPARISON:  None. FINDINGS: There is an irregular hypodense 4.7 cm mass involving the body of the pancreas just to the left of midline. 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 It encases the splenic artery and has a  large zone of contact with the celiac axis. There also is contact with the superior mesenteric artery although it is not encased. There is deformity and narrowing of the confluence of the superior mesenteric vein and portal vein, with narrowing of the superior mesenteric vein. This is seen to best advantage on coronal image 27 series 3. There may also be involvement of the distal duodenum at the ligament of Treitz, suggested on sagittal image 60 series 4 and coronal image 29 series 3. There is chronic ductal dilatation and atrophy of the pancreatic tail. The pancreatic head is normal in appearance. No hepatic metastases are evident. There is mild generalized fatty infiltration of the liver. Multiple small nodes are present in the upper abdomen. There are normal appearances of the adrenals and kidneys. Abdominal aorta is normal in caliber and heavily calcified. Bowel is unremarkable. There is no significant abnormality in the lower chest. There is no significant skeletal lesion. IMPRESSION: 4.7 cm mass of the pancreas just to the left of midline, likely a primary malignancy. Involvement of several vascular structures, also possibly the distal duodenum. These results were called by telephone at the time of interpretation on 11/12/2015 at 12:19 am to Dr. Lily Kocher , who verbally acknowledged these results. Electronically Signed   By: Andreas Newport M.D.   On: 11/12/2015  00:30  CT reviewed with Dr. Thornton Papas.  Assessment;  Patient is 65 year old Caucasian male who presents with over three week history of intermittent upper abdominal pain dating posteriorly associated with anorexia and 25 pounds who is found to have pancreatic mass suspicious for malignant process. 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 He will be further evaluated with pancreatic US to make a definite diagnosis and this study will also white more accurate staging to determine resectability. 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 Workup underway.  Recommendations;  Patient will need pancreatic EUS with FNA. I contacted Dr. Oretha Caprice of [facility]. He will not be able to do this study tomorrow but will schedule for the test to be performed on 11/20/2015. His office will contact patient with instructions.   LOS: 0 days   REHMAN,NAJEEB U  11/12/2015, 4:01 PM

## 2015-11-12 NOTE — ED Provider Notes (Signed)
CSN: [id number]     Arrival date & time 11/11/15  2040 History   First MD Initiated Contact with Patient 11/11/15 2222     Chief Complaint  Patient presents with  . Abdominal Pain     (Consider location/radiation/quality/duration/timing/severity/associated sxs/prior Treatment) HPI Comments: Chief complaint: Abdominal pain  The patient is a 65 year old male who presents to the emergency department with a complaint of diffuse abdominal pain, but worse on the right side. The pain also radiates into the back.  The patient states he has had several weeks of abdominal pain. He states he is usually able to get rid of the pain with rest or over-the-counter medications. He states that today he has had pain worse than in any other episodes, and tonight he just could not take it anymore and came to the emergency department for evaluation. He has not had any recent operations or procedures involving his abdomen. He is not had any nausea, vomiting, or diarrhea. There's been no blood in his stool, no blood in his urine. He does not recall any high fever. He knowledge is that he smokes cigarettes. He denies using any recreational drugs. He states that he has some beers usually in the evenings and sometimes would dinner. Today he tried to drink a beer, but he became so ill that he could not finish it. He presents now for evaluation and assistance with this pain.  Patient is a 65 y.o. male presenting with abdominal pain. The history is provided by the patient.  Abdominal Pain Associated symptoms: no chest pain, no chills, no cough, no diarrhea, no dysuria, no fever, no hematuria, no shortness of breath and no vomiting     Past Medical History  Diagnosis Date  . TB (pulmonary tuberculosis)    Past Surgical History  Procedure Laterality Date  . Leg surgery     History reviewed. No pertinent family history. Social History  Substance Use Topics  . Smoking status: Current Every Day Smoker -- 0.50 packs/day     Types: Cigarettes  . Smokeless tobacco: None  . Alcohol Use: Yes     Comment: beer at night    Review of Systems  Constitutional: Negative for fever and chills.       All ROS Neg except as noted in HPI  HENT: Negative for nosebleeds.   Eyes: Negative for photophobia and discharge.  Respiratory: Negative for cough, shortness of breath and wheezing.   Cardiovascular: Negative for chest pain and palpitations.  Gastrointestinal: Positive for abdominal pain. Negative for vomiting, diarrhea and blood in stool.  Genitourinary: Negative for dysuria, frequency and hematuria.  Musculoskeletal: Positive for back pain. Negative for arthralgias and neck pain.  Skin: Negative.   Neurological: Negative for dizziness, seizures and speech difficulty.  Psychiatric/Behavioral: Negative for hallucinations and confusion.      Allergies  Review of patient's allergies indicates no known allergies.  Home Medications   Prior to Admission medications   Medication Sig Start Date End Date Taking? Authorizing Provider  aspirin EC 81 MG tablet Take 81 mg by mouth daily.   Yes Historical Provider, MD  Multiple Vitamins-Minerals (MULTIVITAMINS THER. W/MINERALS) TABS tablet Take 1 tablet by mouth daily.   Yes Historical Provider, MD   BP 164/70 mmHg  Pulse 78  Temp(Src) 98.2 F (36.8 C) (Oral)  Resp 16  Ht 5\' 11"  (1.803 m)  Wt 63.504 kg  BMI 19.53 kg/m2  SpO2 97% Physical Exam  Constitutional: He is oriented to person, place, and time. He  appears well-developed and well-nourished.  Non-toxic appearance.  HENT:  Head: Normocephalic.  Right Ear: Tympanic membrane and external ear normal.  Left Ear: Tympanic membrane and external ear normal.  Eyes: EOM and lids are normal. Pupils are equal, round, and reactive to light.  Neck: Normal range of motion. Neck supple. Carotid bruit is not present.  Cardiovascular: Normal rate, regular rhythm, normal heart sounds, intact distal pulses and normal pulses.    Pulmonary/Chest: Breath sounds normal. No respiratory distress.  Abdominal: Soft. Bowel sounds are normal. He exhibits no distension and no fluid wave. There is tenderness in the right upper quadrant and right lower quadrant. There is no guarding.  Genitourinary: Rectum normal. Guaiac negative stool.  There are a few hemorrhoid tags at the anus. No mass appreciated about the sprinter or in the rectum. Stool is negative for occult blood.  Musculoskeletal: Normal range of motion.  Lymphadenopathy:       Head (right side): No submandibular adenopathy present.       Head (left side): No submandibular adenopathy present.    He has no cervical adenopathy.  Neurological: He is alert and oriented to person, place, and time. He has normal strength. No cranial nerve deficit or sensory deficit.  Skin: Skin is warm and dry.  Psychiatric: He has a normal mood and affect. His speech is normal.  Nursing note and vitals reviewed.   ED Course  Pt seen with me by Dr Meda Coffee. Tomi Bamberger.  Procedures (including critical care time) Labs Review Labs Reviewed  CBC WITH DIFFERENTIAL/PLATELET - Abnormal; Notable for the following:    RBC 4.04 (*)    MCH 34.4 (*)    All other components within normal limits  COMPREHENSIVE METABOLIC PANEL - Abnormal; Notable for the following:    Sodium 126 (*)    Chloride 93 (*)    Creatinine, Ser 0.48 (*)    Calcium 8.6 (*)    All other components within normal limits  LIPASE, BLOOD - Abnormal; Notable for the following:    Lipase 85 (*)    All other components within normal limits  URINALYSIS, ROUTINE W REFLEX MICROSCOPIC (NOT AT [facility]) - Abnormal; Notable for the following:    Hgb urine dipstick TRACE (*)    All other components within normal limits  URINE MICROSCOPIC-ADD ON - Abnormal; Notable for the following:    Squamous Epithelial / LPF 0-5 (*)    Bacteria, UA RARE (*)    All other components within normal limits  TROPONIN I  POC OCCULT BLOOD, ED    Imaging  Review Ct Abdomen Pelvis W Contrast  11/12/2015  CLINICAL DATA:  Intermittent abdominal pain for 3 weeks, worse today. EXAM: CT ABDOMEN AND PELVIS WITH CONTRAST TECHNIQUE: Multidetector CT imaging of the abdomen and pelvis was performed using the standard protocol following bolus administration of intravenous contrast. CONTRAST:  178mL ISOVUE-300 IOPAMIDOL (ISOVUE-300) INJECTION 61% COMPARISON:  None. FINDINGS: There is an irregular hypodense 4.7 cm mass involving the body of the pancreas just to the left of midline. 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 It encases the splenic artery and has a large zone of contact with the celiac axis. There also is contact with the superior mesenteric artery although it is not encased. There is deformity and narrowing of the confluence of the superior mesenteric vein and portal vein, with narrowing of the superior mesenteric vein. This is seen to best advantage on coronal image 27 series  3. There may also be involvement of the distal duodenum at the ligament of Treitz, suggested on sagittal image 60 series 4 and coronal image 29 series 3. There is chronic ductal dilatation and atrophy of the pancreatic tail. The pancreatic head is normal in appearance. No hepatic metastases are evident. There is mild generalized fatty infiltration of the liver. Multiple small nodes are present in the upper abdomen. There are normal appearances of the adrenals and kidneys. Abdominal aorta is normal in caliber and heavily calcified. Bowel is unremarkable. There is no significant abnormality in the lower chest. There is no significant skeletal lesion. IMPRESSION: 4.7 cm mass of the pancreas just to the left of midline, likely a primary malignancy. Involvement of several vascular structures, also possibly the distal duodenum. These results were called by telephone at the time of interpretation on 11/12/2015 at 12:19 am to Dr. Lily Kocher , who verbally acknowledged these results. Electronically Signed   By: Andreas Newport M.D.   On: 11/12/2015 00:30   I have personally reviewed and evaluated these images and lab results as part of my medical decision-making.   EKG Interpretation None      MDM Pain improved after IV fentanyl. Nausea improved after IV Zofran.  Urinalysis is nonacute. Complete blood count is within normal limits. Comments metabolic panel shows the sodium to be slightly low at 126, the creatinine is low at 0.48, otherwise the competence of metabolic panel is within normal limits. The anion gap is 10, within normal limits. The lipase is slightly elevated at 85. The troponin is negative for acute event at less than 0.03. CT scan of the abdomen shows a 4.7 cm mass of the pancreas just to the left of the midline. It is believed that this is a primary malignancy. It is also of note that this mass involves several vascular structures and is seen, possibly at the area of the distal duodenum.  I have discussed the case with the hospitalist. The hospitalist will see the patient here in the emergency department.    Final diagnoses:  Pancreatic mass  Hyponatremia    *I have reviewed nursing notes, vital signs, and all appropriate lab and imaging results for this patient.[address], PA-C 11/12/15 [id number]  Rolland Porter, MD 11/15/15 [phone]

## 2015-11-13 ENCOUNTER — Telehealth: Payer: Self-pay

## 2015-11-13 ENCOUNTER — Other Ambulatory Visit: Payer: Self-pay

## 2015-11-13 DIAGNOSIS — E871 Hypo-osmolality and hyponatremia: Secondary | ICD-10-CM | POA: Diagnosis not present

## 2015-11-13 DIAGNOSIS — K869 Disease of pancreas, unspecified: Secondary | ICD-10-CM | POA: Diagnosis not present

## 2015-11-13 DIAGNOSIS — F17200 Nicotine dependence, unspecified, uncomplicated: Secondary | ICD-10-CM | POA: Diagnosis not present

## 2015-11-13 DIAGNOSIS — K8689 Other specified diseases of pancreas: Secondary | ICD-10-CM

## 2015-11-13 LAB — BASIC METABOLIC PANEL
Anion gap: 6 (ref 5–15)
BUN: 6 mg/dL (ref 6–20)
CHLORIDE: 99 mmol/L — AB (ref 101–111)
CO2: 28 mmol/L (ref 22–32)
CREATININE: 0.49 mg/dL — AB (ref 0.61–1.24)
Calcium: 8.5 mg/dL — ABNORMAL LOW (ref 8.9–10.3)
GFR calc Af Amer: >60 mL/min (ref >60)
GFR calc non Af Amer: >60 mL/min (ref >60)
Glucose, Bld: 114 mg/dL — ABNORMAL HIGH (ref 65–99)
POTASSIUM: 4.2 mmol/L (ref 3.5–5.1)
Sodium: 133 mmol/L — ABNORMAL LOW (ref 135–145)

## 2015-11-13 LAB — CANCER ANTIGEN 19-9: CA 19 9: 1576 U/mL — AB (ref 0–35)

## 2015-11-13 LAB — OSMOLALITY: Osmolality: 268 mOsm/kg — ABNORMAL LOW (ref 275–295)

## 2015-11-13 MED ORDER — HYDROCODONE-ACETAMINOPHEN 5-325 MG PO TABS: 1.0000 | ORAL_TABLET | Freq: Four times a day (QID) | ORAL | Status: DC | PRN

## 2015-11-13 NOTE — Progress Notes (Signed)
Discharge instructions and prescriptions given, verbalized understanding, out in stable condition ambulatory.

## 2015-11-13 NOTE — Progress Notes (Signed)
PROGRESS NOTE  Nicholas [name] [id number] DOB: 06/30/1951 DOA: 11/11/2015 PCP: PROVIDER NOT IN SYSTEM  Brief Narrative: 66 yom presented with abd discomfort for the past 2 weeks. While in the ED, he was found to have a 4.7 cm pancreatic mass. He was admitted for further workup of pancreatic mass and hyponatremia.   Assessment/Plan: 1. Pancreatic mass. CT abd showed 4.7 cm mass of pancreas, likely a primary malignancy. CA antigen 19-9 markedly elevated. Plan for outpatient endoscopic ultrasound and biopsy next week. 2. Hypoosmolar hyponatremia secondary to poor oral intake. Resolving rapidly with IV fluids. Asymptomatic. No headache, nausea or vomiting. TSH wnl.  3. Tobacco use disorder. Pt declined a nicotine patch.    Now asymptomatic.  Follow up biopsy as scheduled with outpatient GI, Dr. Oretha Caprice of [facility] on 5/18.  Discharge home today.   He has an outpatient follow-up with the [facility] May 31 already scheduled. The patient appears to be quite reliable.  DVT prophylaxis: Lovenox Code Status: Full Family Communication: No family present at bedside. Disposition Plan: discharge home today  Murray Hodgkins, MD  [facility] Direct contact:  --Via amion app OR  --[email or web address]; password [email or web address] and click  [email or web address] contact night coverage as above 11/13/2015, 8:11 AM  LOS: 1 day   Consultants:  GI  Procedures:  none  Antimicrobials:  none  HPI/Subjective: Feeling well. Mild abdominal pain but is well controlled with oral medications. Able to eat with difficulty.    Objective: Filed Vitals:   11/12/15 0215 11/12/15 0300 11/12/15 1455 11/12/15 2201  BP: 120/81 154/78 125/74 126/67  Pulse:  80 84 71  Temp:  97.8 F (36.6 C) 98 F (36.7 C) 97.8 F (36.6 C)  TempSrc:  Oral Oral Oral  Resp:  18 18 20   Height:  5\' 11"  (1.803 m)    Weight:  52.39 kg (115 lb 8 oz)    SpO2:  96% 99% 98%    Intake/Output Summary (Last 24  hours) at 11/13/15 0811 Last data filed at 11/12/15 1455  Gross per 24 hour  Intake      0 ml  Output    550 ml  Net   -550 ml     Filed Weights   11/11/15 2045 11/12/15 0300  Weight: 63.504 kg (140 lb) 52.39 kg (115 lb 8 oz)    Exam:    Constitutional:  . Appears calm and comfortable Respiratory:  . CTA bilaterally, no w/r/r.  . Respiratory effort normal. No retractions or accessory muscle use Cardiovascular:  . RRR, no m/r/g . No LE extremity edema   Abdomen:  Marland Kitchen Mild epigastric tenderness Psychiatric:  . judgement and insight appear normal . Mental status o Mood, affect appropriate  I have personally reviewed following labs and imaging studies:  Sodium of 133, chloride 99, remainder of basic metabolic panel unremarkable  Serum osmolality 268  Scheduled Meds: . pantoprazole  40 mg Oral Daily   Continuous Infusions:    Principal Problem:   Hyponatremia Active Problems:   Pancreatic mass   Tobacco use disorder   LOS: 1 day    By signing my name below, I, Rennis Harding attest that this documentation has been prepared under the direction and in the presence of Murray Hodgkins, MD Electronically signed: Rennis Harding  11/13/2015 10:25am    I personally performed the services described in this documentation. All medical record entries made by the scribe were at my direction. I have reviewed the chart and agree that  the record reflects my personal performance and is accurate and complete. Murray Hodgkins, MD

## 2015-11-13 NOTE — Telephone Encounter (Signed)
Pt has been scheduled for 11/20/15 10 am per Dr Ardis Hughs there was an opening and the 2 pm spot not needed

## 2015-11-13 NOTE — Discharge Summary (Signed)
Physician Discharge Summary  Nicholas [name] [id number] DOB: January 11, 1951 DOA: 11/11/2015  PCP:  [facility]. The patient already has follow-up scheduled May 31. He cannot recall the name of his primary care physician. Admit date: 11/11/2015 Discharge date: 11/13/2015  Recommendations for Outpatient Follow-up:   Follow-up pancreatic mass, suspicious for malignancy.  Follow-up hypoosmolar hyponatremia.  Outpatient endoscopic ultrasound to obtain biopsy with Dr. Oretha Caprice of [facility], as scheduled on 5/18.  Follow-up Information    Follow up with Milus Banister, MD On 11/20/2015.   Specialty:  Gastroenterology   Why:  2 PM   Contact information:   [address] [place] [address] [phone]       Follow up with [facility]. Schedule an appointment as soon as possible for a visit in 2 weeks.   Contact information:   [place] [address] [phone]      Discharge Diagnoses:   Pancreatic mass.   Hypoosmolar hyponatremia.  Tobacco use disorder.   Discharge Condition: Improved.  Disposition: [place]  Diet recommendation: Heart healthy   Filed Weights   11/11/15 2045 11/12/15 0300  Weight: 63.504 kg (140 lb) 52.39 kg (115 lb 8 oz)    History of present illness:  57 yom presented with abd discomfort for the past 2 weeks. While in the ED, he was found to have a 4.7 cm pancreatic mass. He was admitted for further workup of pancreatic mass and hyponatremia.   Hospital Course:  Hyponatremia improved appropriately with IV fluids. He's asymptomatic. Tolerating a regular diet and now stable for discharge. Pancreatic mass with associated pain well controlled. Outpatient follow-up is planned as above. Hospitalization was uncomplicated.  Individual issues as below:  1. Pancreatic mass. CT abd showed 4.7 cm mass of pancreas, likely a primary malignancy. CA antigen 19-9 markedly elevated. Plan for outpatient  endoscopic ultrasound and biopsy next week. 2. Hypoosmolar hyponatremia secondary to poor oral intake. Resolving rapidly with IV fluids. Asymptomatic. No headache, nausea or vomiting. TSH wnl.  3. Tobacco use disorder. Pt declined a nicotine patch.  Consultants:  GI  Procedures:  none  Antimicrobials:  None  Discharge Instructions  Discharge Instructions    Activity as tolerated - No restrictions    Complete by:  As directed      Discharge instructions    Complete by:  As directed   Call your physician or seek immediate medical attention for increased pain, fever, vomiting or worsening of condition. Recommend low fat diet.          Discharge Medication List as of 11/13/2015 12:55 PM    START taking these medications   Details  HYDROcodone-acetaminophen (NORCO/VICODIN) 5-325 MG tablet Take 1-2 tablets by mouth every 6 (six) hours as needed for moderate pain., Starting 11/13/2015, Until Discontinued, Print      CONTINUE these medications which have NOT CHANGED   Details  aspirin EC 81 MG tablet Take 81 mg by mouth daily., Until Discontinued, Historical Med    Multiple Vitamins-Minerals (MULTIVITAMINS THER. W/MINERALS) TABS tablet Take 1 tablet by mouth daily., Until Discontinued, Historical Med       No Known Allergies  The results of significant diagnostics from this hospitalization (including imaging, microbiology, ancillary and laboratory) are listed below for reference.    Significant Diagnostic Studies: Ct Abdomen Pelvis W Contrast  11/12/2015  CLINICAL DATA:  Intermittent abdominal pain for 3 weeks, worse today. EXAM: CT ABDOMEN AND PELVIS WITH CONTRAST TECHNIQUE: Multidetector CT imaging  of the abdomen and pelvis was performed using the standard protocol following bolus administration of intravenous contrast. CONTRAST:  125mL ISOVUE-300 IOPAMIDOL (ISOVUE-300) INJECTION 61% COMPARISON:  None. FINDINGS: There is an irregular hypodense 4.7 cm mass involving the body  of the pancreas just to the left of midline. 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 It encases the splenic artery and has a large zone of contact with the celiac axis. There also is contact with the superior mesenteric artery although it is not encased. There is deformity and narrowing of the confluence of the superior mesenteric vein and portal vein, with narrowing of the superior mesenteric vein. This is seen to best advantage on coronal image 27 series 3. There may also be involvement of the distal duodenum at the ligament of Treitz, suggested on sagittal image 60 series 4 and coronal image 29 series 3. There is chronic ductal dilatation and atrophy of the pancreatic tail. The pancreatic head is normal in appearance. No hepatic metastases are evident. There is mild generalized fatty infiltration of the liver. Multiple small nodes are present in the upper abdomen. There are normal appearances of the adrenals and kidneys. Abdominal aorta is normal in caliber and heavily calcified. Bowel is unremarkable. There is no significant abnormality in the lower chest. There is no significant skeletal lesion. IMPRESSION: 4.7 cm mass of the pancreas just to the left of midline, likely a primary malignancy. Involvement of several vascular structures, also possibly the distal duodenum. These results were called by telephone at the time of interpretation on 11/12/2015 at 12:19 am to Dr. Lily Kocher , who verbally acknowledged these results. Electronically Signed   By: Andreas Newport M.D.   On: 11/12/2015 00:30   Labs: Basic Metabolic Panel:  Recent Labs Lab 11/11/15 2240 11/12/15 0513 11/13/15 1005  NA 126* 129* 133*  K 3.8 4.1 4.2  CL 93* 96* 99*  CO2 23 24 28   GLUCOSE 95 101* 114*  BUN 7 7 6   CREATININE 0.48* 0.49* 0.49*  CALCIUM 8.6* 8.7* 8.5*   Liver Function Tests:  Recent Labs Lab 11/11/15 2240 11/12/15 0513  AST 25 21  ALT 18 15*    ALKPHOS 68 62  BILITOT 0.3 0.6  PROT 7.5 7.0  ALBUMIN 3.8 3.6    Recent Labs Lab 11/11/15 2240  LIPASE 85*   CBC:  Recent Labs Lab 11/11/15 2240 11/12/15 0513  WBC 8.3 7.4  NEUTROABS 5.7  --   HGB 13.9 13.4  HCT 39.3 38.2*  MCV 97.3 98.2  PLT 222 235   Cardiac Enzymes:  Recent Labs Lab 11/11/15 2240  TROPONINI <0.03   Principal Problem:   Hyponatremia Active Problems:   Pancreatic mass   Tobacco use disorder   Time coordinating discharge: 35 minutes   Signed:  Murray Hodgkins, MD [facility] 11/13/2015, 4:50 PM    By signing my name below, I, Rennis Harding attest that this documentation has been prepared under the direction and in the presence of Murray Hodgkins, MD Electronically signed: Rennis Harding  11/13/2015 10:25am   I personally performed the services described in this documentation. All medical record entries made by the scribe were at my direction. I have reviewed the chart and agree that the record reflects my personal performance and is accurate and complete. Murray Hodgkins, MD

## 2015-11-13 NOTE — Telephone Encounter (Signed)
-----   Message from Milus Banister, MD sent at 11/13/2015  7:23 AM EDT ----- Regarding: RE: EUS Jaylon Grode, He needs upper EUS, radial + linear, next week on Thursday with MAC at 2pm.  Please push the currently scheduled 2pm patient to later in the day with moderate sedation.  Dx; pancreatic mass.  He is currently an in patient at [facility], fyi  [employer], we're getting him scheduled for next Thursday.    DJ   ----- Message -----    From: Barron Alvine, RN    Sent: 11/12/2015   4:09 PM      To: Milus Banister, MD Subject: Melton Alar: EUS                                          ----- Message -----    From: Worthy Keeler    Sent: 11/12/2015   4:03 PM      To: Barron Alvine, RN Subject: EUS                                            Per Dr Laural Golden patient needs EUS w/ FNA. Dr Laural Golden has spoken to Dr Ardis Hughs. All info in EPIC. Thanks. Lelon Frohlich

## 2015-11-13 NOTE — Care Management Note (Signed)
Case Management Note  Patient Details  Name: Nicholas [name] MRN: [id number] Date of Birth: April 26, 1951  Subjective/Objective: Spoke with patient who is alert and oriented from home. Stated that they can afford medications and are able to make thier medical appointments without difficulty. No DME or O2 at home.  No CM needs identified.                    Action/Plan:Home with self care.   Expected Discharge Date:                  Expected Discharge Plan:  Home/Self Care  In-House Referral:     Discharge planning Services  CM Consult  Post Acute Care Choice:    Choice offered to:     DME Arranged:    DME Agency:     HH Arranged:    [place] Agency:     Status of Service:  Completed, signed off  Medicare Important Message Given:    Date Medicare IM Given:    Medicare IM give by:    Date Additional Medicare IM Given:    Additional Medicare Important Message give by:     If discussed at [place] of Stay Meetings, dates discussed:    Additional Comments:  Alvie Heidelberg, RN 11/13/2015, 1:03 PM

## 2015-11-13 NOTE — Telephone Encounter (Signed)
Left message on machine to call back  

## 2015-11-14 NOTE — Telephone Encounter (Signed)
EUS scheduled, pt instructed and medications reviewed.  Patient instructions mailed to home.  Patient to call with any questions or concerns.  

## 2015-11-17 ENCOUNTER — Encounter (HOSPITAL_COMMUNITY): Payer: Self-pay | Admitting: *Deleted

## 2015-11-20 ENCOUNTER — Telehealth: Payer: Self-pay | Admitting: Gastroenterology

## 2015-11-20 ENCOUNTER — Encounter (HOSPITAL_COMMUNITY): Payer: Self-pay | Admitting: Anesthesiology

## 2015-11-20 ENCOUNTER — Ambulatory Visit (HOSPITAL_COMMUNITY): Admission: RE | Admit: 2015-11-20 | Payer: Medicaid Other | Source: Ambulatory Visit | Admitting: Gastroenterology

## 2015-11-20 DIAGNOSIS — K8689 Other specified diseases of pancreas: Secondary | ICD-10-CM

## 2015-11-20 HISTORY — DX: Malignant (primary) neoplasm, unspecified: C80.1

## 2015-11-20 HISTORY — DX: Unspecified abdominal pain: R10.9

## 2015-11-20 SURGERY — UPPER ENDOSCOPIC ULTRASOUND (EUS) LINEAR
Anesthesia: Monitor Anesthesia Care

## 2015-11-20 MED ORDER — LIDOCAINE HCL (CARDIAC) 20 MG/ML IV SOLN
INTRAVENOUS | Status: AC
  Filled 2015-11-20: qty 5

## 2015-11-20 MED ORDER — PROPOFOL 10 MG/ML IV BOLUS
INTRAVENOUS | Status: AC
  Filled 2015-11-20: qty 40

## 2015-11-20 NOTE — Anesthesia Preprocedure Evaluation (Deleted)
Anesthesia Evaluation  Patient identified by MRN, date of birth, ID band Patient awake    Reviewed: Allergy & Precautions, NPO status , Patient's Chart, lab work & pertinent test results  Airway Mallampati: II  TM Distance: >3 FB Neck ROM: Full    Dental   Pulmonary Current Smoker,  Hx/o (+) PPD CXR normal   Pulmonary exam normal breath sounds clear to auscultation       Cardiovascular negative cardio ROS   Rhythm:Regular Rate:Normal     Neuro/Psych PSYCHIATRIC DISORDERS negative neurological ROS     GI/Hepatic Neg liver ROS, Pancreatic mass   Endo/Other  negative endocrine ROS  Renal/GU negative Renal ROS  negative genitourinary   Musculoskeletal negative musculoskeletal ROS (+)   Abdominal   Peds  Hematology negative hematology ROS (+)   Anesthesia Other Findings   Reproductive/Obstetrics                            Anesthesia Physical Anesthesia Plan  ASA: III  Anesthesia Plan: MAC   Post-op Pain Management:    Induction:   Airway Management Planned: Natural Airway and Nasal Cannula  Additional Equipment:   Intra-op Plan:   Post-operative Plan:   Informed Consent: I have reviewed the patients History and Physical, chart, labs and discussed the procedure including the risks, benefits and alternatives for the proposed anesthesia with the patient or authorized representative who has indicated his/her understanding and acceptance.     Plan Discussed with: Anesthesiologist, CRNA and Surgeon  Anesthesia Plan Comments:         Anesthesia Quick Evaluation

## 2015-11-20 NOTE — Telephone Encounter (Signed)
I have tried several times to reach the pt this morning, messages have been left on the patients voice mail.  I have consulted with Dr Ardis Hughs he is aware of the situation.  Routed to Dr Ardis Hughs for further recommendations.

## 2015-11-20 NOTE — Telephone Encounter (Signed)
Patty,  Thanks.  Please continue trying to reach him to offer another time for EUS, I can probably do a time next week at [facility] during my hospital week.  Lets see what will work for him.  Najeeb, See below.  He cannot make it to today's procedure due to last minute ride issues.  We'll keep working on getting him in.

## 2015-11-20 NOTE — Telephone Encounter (Signed)
Left message on machine to call back  

## 2015-11-21 ENCOUNTER — Inpatient Hospital Stay (HOSPITAL_COMMUNITY)
Admission: EM | Admit: 2015-11-21 | Discharge: 2015-11-25 | DRG: 435 | Disposition: A | Discharge status: Home / Self Care | Payer: Medicaid Other | Attending: Internal Medicine | Admitting: Internal Medicine

## 2015-11-21 ENCOUNTER — Other Ambulatory Visit: Payer: Self-pay

## 2015-11-21 ENCOUNTER — Encounter (HOSPITAL_COMMUNITY): Payer: Self-pay

## 2015-11-21 DIAGNOSIS — Z681 Body mass index (BMI) 19 or less, adult: Secondary | ICD-10-CM

## 2015-11-21 DIAGNOSIS — Z79899 Other long term (current) drug therapy: Secondary | ICD-10-CM

## 2015-11-21 DIAGNOSIS — K8689 Other specified diseases of pancreas: Secondary | ICD-10-CM

## 2015-11-21 DIAGNOSIS — F17210 Nicotine dependence, cigarettes, uncomplicated: Secondary | ICD-10-CM | POA: Diagnosis present

## 2015-11-21 DIAGNOSIS — E871 Hypo-osmolality and hyponatremia: Secondary | ICD-10-CM | POA: Diagnosis present

## 2015-11-21 DIAGNOSIS — Z823 Family history of stroke: Secondary | ICD-10-CM

## 2015-11-21 DIAGNOSIS — Z8611 Personal history of tuberculosis: Secondary | ICD-10-CM

## 2015-11-21 DIAGNOSIS — Z7982 Long term (current) use of aspirin: Secondary | ICD-10-CM

## 2015-11-21 DIAGNOSIS — E43 Unspecified severe protein-calorie malnutrition: Secondary | ICD-10-CM | POA: Diagnosis present

## 2015-11-21 DIAGNOSIS — R109 Unspecified abdominal pain: Secondary | ICD-10-CM | POA: Diagnosis present

## 2015-11-21 DIAGNOSIS — F17200 Nicotine dependence, unspecified, uncomplicated: Secondary | ICD-10-CM | POA: Diagnosis present

## 2015-11-21 DIAGNOSIS — R1084 Generalized abdominal pain: Secondary | ICD-10-CM | POA: Diagnosis present

## 2015-11-21 DIAGNOSIS — Z85828 Personal history of other malignant neoplasm of skin: Secondary | ICD-10-CM

## 2015-11-21 DIAGNOSIS — C259 Malignant neoplasm of pancreas, unspecified: Principal | ICD-10-CM | POA: Diagnosis present

## 2015-11-21 MED ORDER — FAMOTIDINE IN NACL 20-0.9 MG/50ML-% IV SOLN
20.0000 mg | Freq: Once | INTRAVENOUS | Status: AC
  Administered 2015-11-22: 20 mg via INTRAVENOUS
  Filled 2015-11-21: qty 50

## 2015-11-21 NOTE — Telephone Encounter (Signed)
Pt has been rescheduled to 12/11/15 EUS scheduled, pt instructed and medications reviewed.  Patient instructions mailed to home.  Patient to call with any questions or concerns.

## 2015-11-21 NOTE — ED Notes (Signed)
Patient states that he is having severe abdominal pain that started last week on Tuesday, was admitted until Thursday.  Was supposed to go to [facility] and have a biopsy done on Thursday and he had to cancel and reschedule due to not having a ride.  Will not be able to have the biopsy done on the growth on his pancreas until June 8th.  Patient states that he took the remainder of his pain medication yesterday.  Has been drinking beer today.  Complaining of severe pain in lower abdomen into his back.

## 2015-11-21 NOTE — Telephone Encounter (Signed)
Patient calling in requesting to reschedule his procedure. Best # [phone]

## 2015-11-21 NOTE — Telephone Encounter (Signed)
Left message on machine to call back  

## 2015-11-21 NOTE — Telephone Encounter (Signed)
Unable to reach patient. Did not need message on his answering service.

## 2015-11-22 ENCOUNTER — Encounter (HOSPITAL_COMMUNITY): Payer: Self-pay | Admitting: *Deleted

## 2015-11-22 ENCOUNTER — Emergency Department (HOSPITAL_COMMUNITY): Payer: Medicaid Other

## 2015-11-22 DIAGNOSIS — Z79899 Other long term (current) drug therapy: Secondary | ICD-10-CM | POA: Diagnosis not present

## 2015-11-22 DIAGNOSIS — C259 Malignant neoplasm of pancreas, unspecified: Secondary | ICD-10-CM | POA: Diagnosis present

## 2015-11-22 DIAGNOSIS — F17210 Nicotine dependence, cigarettes, uncomplicated: Secondary | ICD-10-CM | POA: Diagnosis present

## 2015-11-22 DIAGNOSIS — K869 Disease of pancreas, unspecified: Secondary | ICD-10-CM | POA: Diagnosis not present

## 2015-11-22 DIAGNOSIS — K8689 Other specified diseases of pancreas: Secondary | ICD-10-CM | POA: Diagnosis not present

## 2015-11-22 DIAGNOSIS — Z85828 Personal history of other malignant neoplasm of skin: Secondary | ICD-10-CM | POA: Diagnosis not present

## 2015-11-22 DIAGNOSIS — Z7982 Long term (current) use of aspirin: Secondary | ICD-10-CM | POA: Diagnosis not present

## 2015-11-22 DIAGNOSIS — E43 Unspecified severe protein-calorie malnutrition: Secondary | ICD-10-CM | POA: Diagnosis present

## 2015-11-22 DIAGNOSIS — R109 Unspecified abdominal pain: Secondary | ICD-10-CM | POA: Diagnosis present

## 2015-11-22 DIAGNOSIS — Z8611 Personal history of tuberculosis: Secondary | ICD-10-CM | POA: Diagnosis not present

## 2015-11-22 DIAGNOSIS — R933 Abnormal findings on diagnostic imaging of other parts of digestive tract: Secondary | ICD-10-CM | POA: Diagnosis not present

## 2015-11-22 DIAGNOSIS — Z681 Body mass index (BMI) 19 or less, adult: Secondary | ICD-10-CM | POA: Diagnosis not present

## 2015-11-22 DIAGNOSIS — R1084 Generalized abdominal pain: Secondary | ICD-10-CM | POA: Diagnosis present

## 2015-11-22 DIAGNOSIS — E871 Hypo-osmolality and hyponatremia: Secondary | ICD-10-CM | POA: Diagnosis present

## 2015-11-22 DIAGNOSIS — Z823 Family history of stroke: Secondary | ICD-10-CM | POA: Diagnosis not present

## 2015-11-22 DIAGNOSIS — F17200 Nicotine dependence, unspecified, uncomplicated: Secondary | ICD-10-CM | POA: Diagnosis not present

## 2015-11-22 LAB — URINALYSIS, ROUTINE W REFLEX MICROSCOPIC
Bilirubin Urine: NEGATIVE
Glucose, UA: NEGATIVE mg/dL
KETONES UR: NEGATIVE mg/dL
NITRITE: NEGATIVE
PH: 8 (ref 5.0–8.0)
Protein, ur: NEGATIVE mg/dL
SPECIFIC GRAVITY, URINE: 1.01 (ref 1.005–1.030)

## 2015-11-22 LAB — ETHANOL

## 2015-11-22 LAB — COMPREHENSIVE METABOLIC PANEL WITH GFR
ALT: 10 U/L — ABNORMAL LOW (ref 17–63)
AST: 18 U/L (ref 15–41)
Albumin: 3.6 g/dL (ref 3.5–5.0)
Alkaline Phosphatase: 61 U/L (ref 38–126)
Anion gap: 8 (ref 5–15)
BUN: 7 mg/dL (ref 6–20)
CO2: 25 mmol/L (ref 22–32)
Calcium: 8.6 mg/dL — ABNORMAL LOW (ref 8.9–10.3)
Chloride: 92 mmol/L — ABNORMAL LOW (ref 101–111)
Creatinine, Ser: 0.39 mg/dL — ABNORMAL LOW (ref 0.61–1.24)
GFR calc Af Amer: >60 mL/min
GFR calc non Af Amer: >60 mL/min
Glucose, Bld: 121 mg/dL — ABNORMAL HIGH (ref 65–99)
Potassium: 3.5 mmol/L (ref 3.5–5.1)
Sodium: 125 mmol/L — ABNORMAL LOW (ref 135–145)
Total Bilirubin: 0.3 mg/dL (ref 0.3–1.2)
Total Protein: 7.1 g/dL (ref 6.5–8.1)

## 2015-11-22 LAB — CBC WITH DIFFERENTIAL/PLATELET
BASOS PCT: 0 %
Basophils Absolute: 0 10*3/uL (ref 0.0–0.1)
EOS ABS: 0.2 10*3/uL (ref 0.0–0.7)
EOS PCT: 2 %
HCT: 35.4 % — ABNORMAL LOW (ref 39.0–52.0)
HEMOGLOBIN: 12.5 g/dL — AB (ref 13.0–17.0)
LYMPHS ABS: 1.4 10*3/uL (ref 0.7–4.0)
Lymphocytes Relative: 18 %
MCH: 34 pg (ref 26.0–34.0)
MCHC: 35.3 g/dL (ref 30.0–36.0)
MCV: 96.2 fL (ref 78.0–100.0)
MONOS PCT: 10 %
Monocytes Absolute: 0.7 10*3/uL (ref 0.1–1.0)
NEUTROS PCT: 70 %
Neutro Abs: 5.3 10*3/uL (ref 1.7–7.7)
PLATELETS: 240 10*3/uL (ref 150–400)
RBC: 3.68 MIL/uL — ABNORMAL LOW (ref 4.22–5.81)
RDW: 12.2 % (ref 11.5–15.5)
WBC: 7.6 10*3/uL (ref 4.0–10.5)

## 2015-11-22 LAB — URINE MICROSCOPIC-ADD ON

## 2015-11-22 LAB — RAPID URINE DRUG SCREEN, HOSP PERFORMED
Amphetamines: NOT DETECTED
Barbiturates: NOT DETECTED
Benzodiazepines: NOT DETECTED
Cocaine: NOT DETECTED
OPIATES: NOT DETECTED
Tetrahydrocannabinol: NOT DETECTED

## 2015-11-22 LAB — LIPASE, BLOOD: Lipase: 41 U/L (ref 11–51)

## 2015-11-22 MED ORDER — MORPHINE SULFATE (PF) 4 MG/ML IV SOLN: 4.0000 mg | INTRAVENOUS | Status: DC | PRN

## 2015-11-22 MED ORDER — ONDANSETRON HCL 4 MG PO TABS: 4.0000 mg | ORAL_TABLET | Freq: Four times a day (QID) | ORAL | Status: DC | PRN

## 2015-11-22 MED ORDER — SODIUM CHLORIDE 0.9 % IV SOLN
INTRAVENOUS | Status: AC
  Administered 2015-11-22 (×2): via INTRAVENOUS

## 2015-11-22 MED ORDER — HYDROMORPHONE HCL 1 MG/ML IJ SOLN: 1.0000 mg | INTRAMUSCULAR | Status: AC | PRN

## 2015-11-22 MED ORDER — ONDANSETRON HCL 4 MG/2ML IJ SOLN
4.0000 mg | INTRAMUSCULAR | Status: DC | PRN
  Administered 2015-11-22: 4 mg via INTRAVENOUS
  Filled 2015-11-22: qty 2

## 2015-11-22 MED ORDER — IOPAMIDOL (ISOVUE-300) INJECTION 61%
100.0000 mL | Freq: Once | INTRAVENOUS | Status: AC | PRN
  Administered 2015-11-22: 100 mL via INTRAVENOUS

## 2015-11-22 MED ORDER — DIATRIZOATE MEGLUMINE & SODIUM 66-10 % PO SOLN
ORAL | Status: AC
  Filled 2015-11-22: qty 30

## 2015-11-22 MED ORDER — MORPHINE SULFATE (PF) 4 MG/ML IV SOLN
4.0000 mg | INTRAVENOUS | Status: DC | PRN
Start: 2015-11-22 — End: 2015-11-23
  Administered 2015-11-23: 4 mg via INTRAVENOUS
  Filled 2015-11-22: qty 1

## 2015-11-22 MED ORDER — ENSURE ENLIVE PO LIQD
237.0000 mL | Freq: Two times a day (BID) | ORAL | Status: DC
  Administered 2015-11-24: 237 mL via ORAL

## 2015-11-22 MED ORDER — ONDANSETRON HCL 4 MG/2ML IJ SOLN: 4.0000 mg | Freq: Three times a day (TID) | INTRAMUSCULAR | Status: DC | PRN

## 2015-11-22 MED ORDER — SODIUM CHLORIDE 0.9 % IV SOLN: INTRAVENOUS | Status: AC

## 2015-11-22 MED ORDER — ONDANSETRON HCL 4 MG/2ML IJ SOLN: 4.0000 mg | Freq: Four times a day (QID) | INTRAMUSCULAR | Status: DC | PRN

## 2015-11-22 MED ORDER — MORPHINE SULFATE (PF) 4 MG/ML IV SOLN
4.0000 mg | INTRAVENOUS | Status: AC | PRN
  Administered 2015-11-22 (×2): 4 mg via INTRAVENOUS
  Filled 2015-11-22 (×2): qty 1

## 2015-11-22 MED ORDER — SODIUM CHLORIDE 0.9 % IV SOLN
INTRAVENOUS | Status: DC
  Administered 2015-11-22: 1000 mL via INTRAVENOUS

## 2015-11-22 MED ORDER — HYDROCODONE-ACETAMINOPHEN 5-325 MG PO TABS
1.0000 | ORAL_TABLET | Freq: Four times a day (QID) | ORAL | Status: DC | PRN
  Administered 2015-11-22: 1 via ORAL
  Filled 2015-11-22: qty 1

## 2015-11-22 NOTE — Progress Notes (Signed)
I rec'd a call from Dr. Marin Comment this AM to tell me he was transferring this patient to [facility] in order to get an EUS.  Apparently, the patient ran out of pain meds, missed his 5/18 EUS appointment b/c ride issues, and came to [facility] for pain control.  I made it clear that I do not know if Dr. Ardis Hughs will be available to do the EUS early this coming week.  Though there does not appear to be an urgent need for the EUS, Dr. Marin Comment felt that the patient should remain hospitalized for pain control until it can be done.  I will communicate with Dr. Ardis Hughs about this when he returns to work on Monday and plans can be made accordingly.

## 2015-11-22 NOTE — Progress Notes (Signed)
Report called to [facility].

## 2015-11-22 NOTE — Progress Notes (Signed)
Dr. Verlon Au notified on patient's arrival to unit.

## 2015-11-22 NOTE — Progress Notes (Signed)
Patient arrived to 1330 at around 1710. Patient is alert and oriented x 3. Verbalized abdominal pain level of 3/10, medicated. Placed comfortably in bed. Will continue to monitor.

## 2015-11-22 NOTE — H&P (Signed)
PCP:   PROVIDER NOT IN SYSTEM   Chief Complaint:  Abdominal pain  HPI: 65 yo male with h/o recent pancreatic mass found a couple of weeks ago sent home by dr Sarajane Jews with 60 pain pills and an appt on may 18 in [place] with GI to have biopsy done of his pancreatic mass.  Pt had a friend that was going to bring him but she failed to show up to bring him so he missed the appt.  He rescheduled but this could not be scheduled until June 8.  Pt ran out of his pain pills yesterday and he started having worsening generalized abdominal pain with nausea and vomiting.  He denies any fevers.  He was given norco 5mg  tablets which have been helping him until he ran out.  Pt given one dose of morphine 4mg  iv in the ED which pt reports helped him 'alot'.  Pt has had no vomiting in the ED.  Pt referred for admission for his pancreatic mass and pain.  Review of Systems:  Positive and negative as per HPI otherwise all other systems are negative  Past Medical History: Past Medical History  Diagnosis Date  . Cancer (HCC)     Skin cancer-ear, skin cancer -leeg,cancer of leg muscle- left.  . TB (pulmonary tuberculosis)     3 yrs ago-tx" positive TB test aslso" was told he is not contagious.  . Abdominal pain in male     "was told he has a Mass near pancreas"   Past Surgical History  Procedure Laterality Date  . Leg surgery Left     " cancer removed from muscle"  . Colonoscopy w/ polypectomy      Medications: Prior to Admission medications   Medication Sig Start Date End Date Taking? Authorizing Provider  aspirin EC 81 MG tablet Take 81 mg by mouth daily.    Historical Provider, MD  hydrocerin (EUCERIN) CREA Apply 1 application topically 2 (two) times daily as needed (For dry skin.).    Historical Provider, MD  HYDROcodone-acetaminophen (NORCO/VICODIN) 5-325 MG tablet Take 1-2 tablets by mouth every 6 (six) hours as needed for moderate pain. 11/13/15   Samuella Cota, MD  Multiple  Vitamins-Minerals (MULTIVITAMINS THER. W/MINERALS) TABS tablet Take 1 tablet by mouth daily.    Historical Provider, MD    Allergies:  No Known Allergies  Social History:  reports that he has been smoking Cigarettes.  He has a 15 pack-year smoking history. He does not have any smokeless tobacco history on file. He reports that he drinks alcohol. He reports that he does not use illicit drugs.  Family History: Family History  Problem Relation Age of Onset  . Pneumonia Father   . Stroke Mother     Physical Exam: Filed Vitals:   11/22/15 0130 11/22/15 0200 11/22/15 0230 11/22/15 0300  BP: 149/79 150/78 143/79 151/80  Pulse:      Temp:      TempSrc:      Resp: 18 19 17 17   Height:      Weight:      SpO2:       General appearance: alert, cooperative and no distress Head: Normocephalic, without obvious abnormality, atraumatic Eyes: negative Nose: Nares normal. Septum midline. Mucosa normal. No drainage or sinus tenderness. Neck: no JVD and supple, symmetrical, trachea midline Lungs: clear to auscultation bilaterally Heart: regular rate and rhythm, S1, S2 normal, no murmur, click, rub or gallop Abdomen: soft, non-tender; bowel sounds normal; no masses,  no  organomegaly Extremities: extremities normal, atraumatic, no cyanosis or edema Pulses: 2+ and symmetric Skin: Skin color, texture, turgor normal. No rashes or lesions Neurologic: Grossly normal    Labs on Admission:   Recent Labs  11/22/15 0003  NA 125*  K 3.5  CL 92*  CO2 25  GLUCOSE 121*  BUN 7  CREATININE 0.39*  CALCIUM 8.6*    Recent Labs  11/22/15 0003  AST 18  ALT 10*  ALKPHOS 61  BILITOT 0.3  PROT 7.1  ALBUMIN 3.6    Recent Labs  11/22/15 0003  LIPASE 41    Recent Labs  11/22/15 0003  WBC 7.6  NEUTROABS 5.3  HGB 12.5*  HCT 35.4*  MCV 96.2  PLT 240   Radiological Exams on Admission: Ct Abdomen Pelvis W Contrast  11/22/2015  CLINICAL DATA:  65 year old male with abdominal pain.  EXAM: CT ABDOMEN AND PELVIS WITH CONTRAST TECHNIQUE: Multidetector CT imaging of the abdomen and pelvis was performed using the standard protocol following bolus administration of intravenous contrast. CONTRAST:  131mL ISOVUE-300 IOPAMIDOL (ISOVUE-300) INJECTION 61% COMPARISON:  CT dated 11/11/2015 FINDINGS: There is emphysematous changes of the lung bases. Focal area of scarring and calcification noted at the left lung base. No intra-abdominal free air. There is diffuse mesenteric stranding and edema as well as small ascites, increased compared to the most recent study of 11/11/2015. There are two subcentimeter hypodense densities in the right lobe of the liver inferiorly adjacent the gallbladder which are incompletely characterized. Metastatic disease is not excluded. MRI without and with contrast may provide better characterisation. A faint subcentimeter hypodensity is also noted in the left lobe of the liver is superiorly (series 2, image 12). There is mild irregularity of the hepatic contour. The gallbladder appears unremarkable. The head of the pancreas appears unremarkable. There is a large hypo enhancing lesion in the body of the pancreas measuring approximately 4.3 x 4.0 cm grossly similar to prior study. Difference in measurement between the 2 studies is likely related to measurement technique. There is atrophy of the distal gland with dilatation of the duct. This lesion most likely represents a primary pancreatic malignancy. The spleen, adrenal glands, kidneys, visualized ureters, and urinary bladder appear unremarkable. The prostate and seminal vesicles are grossly unremarkable. There is abutment of the distal duodenum at the ligament of Treitz by the pancreatic mass. There is no evidence of bowel obstruction or active inflammation. Moderate stool noted throughout the colon. The appendix appears unremarkable. 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 Focal area of calcified atherosclerotic  plaque inferior to the renal arteries with moderate narrowing of the lumen of the aorta (series 8, image 17). The origins of the celiac axis, SMA, and IMA appear patent. There is abutment of the proximal aspect of the celiac artery as well as the SMA by the pancreatic mass without definite evidence of encasement. There is encasement of the splenic artery. There is compression of the porta splenic confluence by the pancreatic mass as well as narrowing of the central SMV. The SMV, and main portal vein however remain patent. No the splenic vein is not visualized and appears to be occluded by the mass. No portal venous gas identified. Multiple small upper abdominal and mesenteric lymph nodes noted. A nodular density posterior to the pancreatic mass and anterior to the left renal vein noted measuring 7 mm in short axis (series 2, image 27). There is osteopenia with degenerative changes of the spine. No acute fracture. Probable small bone island and in  the left acetabulum anteriorly IMPRESSION: Diffuse mesenteric edema and small ascites with interval increase in the size of the free fluid compared to prior study. Large hypo enhancing pancreatic mass as described most compatible with primary pancreatic neoplasm. There is abutment of the celiac axis, SMA and encasement of the splenic artery. The mass occludes the splenic vein and compresses the porta splenic confluence Abutment of the distal duodenum at the ligament of Treitz peripancreatic mass. No bowel obstruction. Faint subcentimeter hepatic hypodense lesions, not characterized. Metastatic disease is not excluded. Further evaluation with MRI without and with contrast recommended. Electronically Signed   By: Anner Crete M.D.   On: 11/22/2015 03:56    Assessment/Plan  65 yo male with new pancreatic mass and uncontrolled pain  Principal Problem:   Abdominal pain, acute, generalized- due to pancreatic mass.  Will obs today, restart on his oral norco 5mg  1-2  tabs prn.  Give morphine 4mg  iv prn for severe breakthrough pain.  Will consult GI here to see if anything can be done to expediate biopsy.  O/w he will have to keep his appointment for June 8, but he would need more pain meds at discharge.  Active Problems:   Pancreatic mass- highly suspicous for a cancer   Hyponatremia- noted, pt denies significant alcohol use   Tobacco use disorder- noted  obs on medical.  Full code.  Mistie Adney A 11/22/2015, 4:21 AM

## 2015-11-22 NOTE — H&P (Signed)
FOLLOW UP FROM EARLIER ADMISSION:  Patient planned to have EUS with biopsy of pancreatitis mass Dx a few weeks ago, to be done by Dr Ranelle Oyster, admitted earlier this am for pain control. He is doing better and is stable.  Since he was re-admitted, I hesitate to send him out again. Spoke with Dr Loletha Carrow (covering for Dr Edison Nasuti), and we decided that it would be best to transfer him to [facility] and see if Dr Edison Nasuti can help.  Dr Verlon Au will be the accepting physician for hospitalist service.  Thank you and Good Day.  Orvan Falconer, MD FACP.  Hospital Medicine.

## 2015-11-22 NOTE — ED Provider Notes (Signed)
CSN: [id number]     Arrival date & time 11/21/15  2306 History   First MD Initiated Contact with Patient 11/21/15 2345     Chief Complaint  Patient presents with  . Abdominal Pain      HPI Pt was seen at 2350.  Per pt, c/o gradual onset and persistence of constant generalized abd "pain" for the past 2 months, worse since yesterday.  Has been associated with no other symptoms.  Describes the abd pain as "aching," with radiation into his back. Pt endorses hx of "mass on my pancreas" but has not f/u with GI MD as instructed. Endorses etoh intake today. Denies N/V, no diarrhea, no fevers, no back pain, no rash, no CP/SOB, no black or blood in stools.      Past Medical History  Diagnosis Date  . Cancer (HCC)     Skin cancer-ear, skin cancer -leeg,cancer of leg muscle- left.  . TB (pulmonary tuberculosis)     3 yrs ago-tx" positive TB test aslso" was told he is not contagious.  . Abdominal pain in male     "was told he has a Mass near pancreas"   Past Surgical History  Procedure Laterality Date  . Leg surgery Left     " cancer removed from muscle"  . Colonoscopy w/ polypectomy     Family History  Problem Relation Age of Onset  . Pneumonia Father   . Stroke Mother    Social History  Substance Use Topics  . Smoking status: Current Every Day Smoker -- 0.50 packs/day for 30 years    Types: Cigarettes  . Smokeless tobacco: None  . Alcohol Use: 0.0 oz/week    0 Standard drinks or equivalent per week     Comment: beer at night, wine     Review of Systems ROS: Statement: All systems negative except as marked or noted in the HPI; Constitutional: Negative for fever and chills. ; ; Eyes: Negative for eye pain, redness and discharge. ; ; ENMT: Negative for ear pain, hoarseness, nasal congestion, sinus pressure and sore throat. ; ; Cardiovascular: Negative for chest pain, palpitations, diaphoresis, dyspnea and peripheral edema. ; ; Respiratory: Negative for cough, wheezing and stridor. ; ;  Gastrointestinal: +abd pain. Negative for nausea, vomiting, diarrhea, blood in stool, hematemesis, jaundice and rectal bleeding. . ; ; Genitourinary: Negative for dysuria, flank pain and hematuria. ; ; Musculoskeletal: Negative for back pain and neck pain. Negative for swelling and trauma.; ; Skin: Negative for pruritus, rash, abrasions, blisters, bruising and skin lesion.; ; Neuro: Negative for headache, lightheadedness and neck stiffness. Negative for weakness, altered level of consciousness, altered mental status, extremity weakness, paresthesias, involuntary movement, seizure and syncope.      Allergies  Review of patient's allergies indicates no known allergies.  Home Medications   Prior to Admission medications   Medication Sig Start Date End Date Taking? Authorizing Provider  aspirin EC 81 MG tablet Take 81 mg by mouth daily.    Historical Provider, MD  hydrocerin (EUCERIN) CREA Apply 1 application topically 2 (two) times daily as needed (For dry skin.).    Historical Provider, MD  HYDROcodone-acetaminophen (NORCO/VICODIN) 5-325 MG tablet Take 1-2 tablets by mouth every 6 (six) hours as needed for moderate pain. 11/13/15   Samuella Cota, MD  Multiple Vitamins-Minerals (MULTIVITAMINS THER. W/MINERALS) TABS tablet Take 1 tablet by mouth daily.    Historical Provider, MD   BP 176/86 mmHg  Pulse 79  Temp(Src) 97.9 F (36.6 C) (Oral)  Resp 23  Ht 5\' 11"  (1.803 m)  Wt 115 lb (52.164 kg)  BMI 16.05 kg/m2  SpO2 100% Physical Exam  2355: Physical examination:  Nursing notes reviewed; Vital signs and O2 SAT reviewed;  Constitutional: Well developed, Well nourished, Well hydrated, Uncomfortable appearing.; Head:  Normocephalic, atraumatic; Eyes: EOMI, PERRL, No scleral icterus; ENMT: Mouth and pharynx normal, Mucous membranes moist; Neck: Supple, Full range of motion, No lymphadenopathy; Cardiovascular: Regular rate and rhythm, No murmur, rub, or gallop; Respiratory: Breath sounds clear &  equal bilaterally, No rales, rhonchi, wheezes.  Speaking full sentences with ease, Normal respiratory effort/excursion; Chest: Nontender, Movement normal; Abdomen: Soft, +diffusely tender to palp. Nondistended, Normal bowel sounds; Genitourinary: No CVA tenderness; Extremities: Pulses normal, No tenderness, No edema, No calf edema or asymmetry.; Neuro: AA&Ox3, Major CN grossly intact.  Speech clear. No gross focal motor or sensory deficits in extremities.; Skin: Color normal, Warm, Dry.; Psych:  Intoxicated.    ED Course  Procedures (including critical care time) Labs Review  Imaging Review  I have personally reviewed and evaluated these images and lab results as part of my medical decision-making.   EKG Interpretation None      MDM  MDM Reviewed: previous chart, nursing note and vitals Reviewed previous: labs Interpretation: labs and CT scan Total time providing critical care: 30-74 minutes. This excludes time spent performing separately reportable procedures and services. Consults: admitting MD   CRITICAL CARE Performed by: Alfonzo Feller Total critical care time: 35 minutes Critical care time was exclusive of separately billable procedures and treating other patients. Critical care was necessary to treat or prevent imminent or life-threatening deterioration. Critical care was time spent personally by me on the following activities: development of treatment plan with patient and/or surrogate as well as nursing, discussions with consultants, evaluation of patient's response to treatment, examination of patient, obtaining history from patient or surrogate, ordering and performing treatments and interventions, ordering and review of laboratory studies, ordering and review of radiographic studies, pulse oximetry and re-evaluation of patient's condition.   Results for orders placed or performed during the hospital encounter of 11/21/15  Comprehensive metabolic panel  Result Value  Ref Range   Sodium 125 (L) 135 - 145 mmol/L   Potassium 3.5 3.5 - 5.1 mmol/L   Chloride 92 (L) 101 - 111 mmol/L   CO2 25 22 - 32 mmol/L   Glucose, Bld 121 (H) 65 - 99 mg/dL   BUN 7 6 - 20 mg/dL   Creatinine, Ser 0.39 (L) 0.61 - 1.24 mg/dL   Calcium 8.6 (L) 8.9 - 10.3 mg/dL   Total Protein 7.1 6.5 - 8.1 g/dL   Albumin 3.6 3.5 - 5.0 g/dL   AST 18 15 - 41 U/L   ALT 10 (L) 17 - 63 U/L   Alkaline Phosphatase 61 38 - 126 U/L   Total Bilirubin 0.3 0.3 - 1.2 mg/dL   GFR calc non Af Amer >60 >60 mL/min   GFR calc Af Amer >60 >60 mL/min   Anion gap 8 5 - 15  Lipase, blood  Result Value Ref Range   Lipase 41 11 - 51 U/L  CBC with Differential  Result Value Ref Range   WBC 7.6 4.0 - 10.5 K/uL   RBC 3.68 (L) 4.22 - 5.81 MIL/uL   Hemoglobin 12.5 (L) 13.0 - 17.0 g/dL   HCT 35.4 (L) 39.0 - 52.0 %   MCV 96.2 78.0 - 100.0 fL   MCH 34.0 26.0 - 34.0 pg  MCHC 35.3 30.0 - 36.0 g/dL   RDW 12.2 11.5 - 15.5 %   Platelets 240 150 - 400 K/uL   Neutrophils Relative % 70 %   Neutro Abs 5.3 1.7 - 7.7 K/uL   Lymphocytes Relative 18 %   Lymphs Abs 1.4 0.7 - 4.0 K/uL   Monocytes Relative 10 %   Monocytes Absolute 0.7 0.1 - 1.0 K/uL   Eosinophils Relative 2 %   Eosinophils Absolute 0.2 0.0 - 0.7 K/uL   Basophils Relative 0 %   Basophils Absolute 0.0 0.0 - 0.1 K/uL  Ethanol  Result Value Ref Range   Alcohol, Ethyl (B) <5 <5 mg/dL  Urine rapid drug screen (hosp performed)  Result Value Ref Range   Opiates NONE DETECTED NONE DETECTED   Cocaine NONE DETECTED NONE DETECTED   Benzodiazepines NONE DETECTED NONE DETECTED   Amphetamines NONE DETECTED NONE DETECTED   Tetrahydrocannabinol NONE DETECTED NONE DETECTED   Barbiturates NONE DETECTED NONE DETECTED  Urinalysis, Routine w reflex microscopic  Result Value Ref Range   Color, Urine YELLOW YELLOW   APPearance CLEAR CLEAR   Specific Gravity, Urine 1.010 1.005 - 1.030   pH 8.0 5.0 - 8.0   Glucose, UA NEGATIVE NEGATIVE mg/dL   Hgb urine dipstick  TRACE (A) NEGATIVE   Bilirubin Urine NEGATIVE NEGATIVE   Ketones, ur NEGATIVE NEGATIVE mg/dL   Protein, ur NEGATIVE NEGATIVE mg/dL   Nitrite NEGATIVE NEGATIVE   Leukocytes, UA SMALL (A) NEGATIVE  Urine microscopic-add on  Result Value Ref Range   Squamous Epithelial / LPF 0-5 (A) NONE SEEN   WBC, UA 0-5 0 - 5 WBC/hpf   RBC / HPF 0-5 0 - 5 RBC/hpf   Bacteria, UA RARE (A) NONE SEEN   Ct Abdomen Pelvis W Contrast 11/22/2015  CLINICAL DATA:  65 year old male with abdominal pain. EXAM: CT ABDOMEN AND PELVIS WITH CONTRAST TECHNIQUE: Multidetector CT imaging of the abdomen and pelvis was performed using the standard protocol following bolus administration of intravenous contrast. CONTRAST:  134mL ISOVUE-300 IOPAMIDOL (ISOVUE-300) INJECTION 61% COMPARISON:  CT dated 11/11/2015 FINDINGS: There is emphysematous changes of the lung bases. Focal area of scarring and calcification noted at the left lung base. No intra-abdominal free air. There is diffuse mesenteric stranding and edema as well as small ascites, increased compared to the most recent study of 11/11/2015. There are two subcentimeter hypodense densities in the right lobe of the liver inferiorly adjacent the gallbladder which are incompletely characterized. Metastatic disease is not excluded. MRI without and with contrast may provide better characterisation. A faint subcentimeter hypodensity is also noted in the left lobe of the liver is superiorly (series 2, image 12). There is mild irregularity of the hepatic contour. The gallbladder appears unremarkable. The head of the pancreas appears unremarkable. There is a large hypo enhancing lesion in the body of the pancreas measuring approximately 4.3 x 4.0 cm grossly similar to prior study. Difference in measurement between the 2 studies is likely related to measurement technique. There is atrophy of the distal gland with dilatation of the duct. This lesion most likely represents a primary pancreatic  malignancy. The spleen, adrenal glands, kidneys, visualized ureters, and urinary bladder appear unremarkable. The prostate and seminal vesicles are grossly unremarkable. There is abutment of the distal duodenum at the ligament of Treitz by the pancreatic mass. There is no evidence of bowel obstruction or active inflammation. Moderate stool noted throughout the colon. The appendix appears unremarkable. 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 Focal area  of calcified atherosclerotic plaque inferior to the renal arteries with moderate narrowing of the lumen of the aorta (series 8, image 17). The origins of the celiac axis, SMA, and IMA appear patent. There is abutment of the proximal aspect of the celiac artery as well as the SMA by the pancreatic mass without definite evidence of encasement. There is encasement of the splenic artery. There is compression of the porta splenic confluence by the pancreatic mass as well as narrowing of the central SMV. The SMV, and main portal vein however remain patent. No the splenic vein is not visualized and appears to be occluded by the mass. No portal venous gas identified. Multiple small upper abdominal and mesenteric lymph nodes noted. A nodular density posterior to the pancreatic mass and anterior to the left renal vein noted measuring 7 mm in short axis (series 2, image 27). There is osteopenia with degenerative changes of the spine. No acute fracture. Probable small bone island and in the left acetabulum anteriorly IMPRESSION: Diffuse mesenteric edema and small ascites with interval increase in the size of the free fluid compared to prior study. Large hypo enhancing pancreatic mass as described most compatible with primary pancreatic neoplasm. There is abutment of the celiac axis, SMA and encasement of the splenic artery. The mass occludes the splenic vein and compresses the porta splenic confluence Abutment of the distal duodenum at the ligament of Treitz  peripancreatic mass. No bowel obstruction. Faint subcentimeter hepatic hypodense lesions, not characterized. Metastatic disease is not excluded. Further evaluation with MRI without and with contrast recommended. Electronically Signed   By: Anner Crete M.D.   On: 11/22/2015 03:56    Results for TADEO, HARMELINK (MRN [id number]) as of 11/22/2015 04:04  Ref. Range 02/09/2013 10:16 11/11/2015 22:40 11/12/2015 05:13 11/13/2015 10:05 11/22/2015 00:03  Sodium Latest Ref Range: 135-145 mmol/L 133 (L) 126 (L) 129 (L) 133 (L) 125 (L)    0415:  CT scan with worsening ascites and new mesenteric edema. Labs with recurrent hyponatremia. Pt continues to c/o pain; will re-dose meds. No s/s etoh withdrawal at this time. T/C to [facility] Dr. Shanon Brow, case discussed, including:  HPI, pertinent PM/SHx, VS/PE, dx testing, ED course and treatment:  Agreeable to admit, requests to write temporary orders, obtain observation medical bed to team APAdmits.    Francine Graven, DO 11/25/15 [phone]

## 2015-11-23 DIAGNOSIS — F17200 Nicotine dependence, unspecified, uncomplicated: Secondary | ICD-10-CM

## 2015-11-23 DIAGNOSIS — E871 Hypo-osmolality and hyponatremia: Secondary | ICD-10-CM

## 2015-11-23 DIAGNOSIS — K869 Disease of pancreas, unspecified: Secondary | ICD-10-CM

## 2015-11-23 LAB — BASIC METABOLIC PANEL
Anion gap: 4 — ABNORMAL LOW (ref 5–15)
CALCIUM: 8.9 mg/dL (ref 8.9–10.3)
CHLORIDE: 99 mmol/L — AB (ref 101–111)
CO2: 28 mmol/L (ref 22–32)
CREATININE: 0.44 mg/dL — AB (ref 0.61–1.24)
GFR calc non Af Amer: >60 mL/min (ref >60)
Glucose, Bld: 114 mg/dL — ABNORMAL HIGH (ref 65–99)
Potassium: 4 mmol/L (ref 3.5–5.1)
SODIUM: 131 mmol/L — AB (ref 135–145)

## 2015-11-23 MED ORDER — ACETAMINOPHEN 325 MG PO TABS: 650.0000 mg | ORAL_TABLET | Freq: Four times a day (QID) | ORAL | Status: DC | PRN

## 2015-11-23 MED ORDER — POLYETHYLENE GLYCOL 3350 17 G PO PACK
17.0000 g | PACK | Freq: Every day | ORAL | Status: DC
  Administered 2015-11-23 – 2015-11-24 (×2): 17 g via ORAL
  Filled 2015-11-23 (×2): qty 1

## 2015-11-23 MED ORDER — MORPHINE SULFATE (PF) 2 MG/ML IV SOLN
2.0000 mg | INTRAVENOUS | Status: DC | PRN
  Administered 2015-11-23 – 2015-11-24 (×2): 2 mg via INTRAVENOUS
  Filled 2015-11-23 (×2): qty 1

## 2015-11-23 MED ORDER — ENOXAPARIN SODIUM 40 MG/0.4ML ~~LOC~~ SOLN
40.0000 mg | SUBCUTANEOUS | Status: DC
  Administered 2015-11-23: 40 mg via SUBCUTANEOUS
  Filled 2015-11-23: qty 0.4

## 2015-11-23 MED ORDER — ALBUTEROL SULFATE (2.5 MG/3ML) 0.083% IN NEBU: 2.5000 mg | INHALATION_SOLUTION | RESPIRATORY_TRACT | Status: DC | PRN

## 2015-11-23 MED ORDER — OXYCODONE HCL 5 MG PO TABS
10.0000 mg | ORAL_TABLET | ORAL | Status: DC | PRN
  Administered 2015-11-23 – 2015-11-25 (×7): 10 mg via ORAL
  Filled 2015-11-23 (×8): qty 2

## 2015-11-23 NOTE — Progress Notes (Signed)
PROGRESS NOTE        PATIENT DETAILS Name: Nicholas [name] Age: 65 y.o. Sex: male Date of Birth: 01/03/1951 Admit Date: 11/21/2015 Admitting Physician Phillips Grout, MD [id number] NOT IN SYSTEM   Brief Narrative: Patient is a 65 y.o. male known pancreatic mass, admitted with worsening abdominal pain and hyponatremia  Subjective: Abdominal pain much better, asking for diet to be upgraded.  Assessment/Plan: Principal Problem: Abdominal pain, acute, generalized: Secondary to underlying pancreatic mass, minimize IV narcotics,slowly transition more to oral narcotics. Abdomen is soft and nontender, advance to soft diet. Await formal GI evaluation to see if EUS can be completed this admission, otherwise may need to pursue again as outpatient.  Active Problems: Hyponatremia: Secondary to poor oral intake, significantly improved with IV fluids. Continue to monitor, suspect we can discontinue IV fluids.  Pancreatic mass: Recent CT scan abdomen showed a 4.7 cm pancreatic max CA 19-9 markedly elevated, plans are for EUS at some point. Await GI evaluation  Tobacco abuse: Counseled.  DVT Prophylaxis: Prophylactic Lovenox   Code Status: Full code   Family Communication: Non at bedside  Disposition Plan: Remain inpatient-home in next 1-2 days  Antimicrobial agents: None  Procedures: None  CONSULTS:  GI  Time spent: 25- minutes-Greater than 50% of this time was spent in counseling, explanation of diagnosis, planning of further management, and coordination of care.  MEDICATIONS: Anti-infectives    None      Scheduled Meds: . feeding supplement (ENSURE ENLIVE)  237 mL Oral BID BM  . polyethylene glycol  17 g Oral Daily   Continuous Infusions:  PRN Meds:.acetaminophen, albuterol, morphine injection, ondansetron **OR** ondansetron (ZOFRAN) IV, oxyCODONE   PHYSICAL EXAM: Vital signs: Filed Vitals:   11/22/15 1259 11/22/15 1703 11/22/15  2114 11/23/15 0359  BP:  151/91 146/81 165/84  Pulse:  68 65 65  Temp:  98 F (36.7 C) 98.1 F (36.7 C) 98 F (36.7 C)  TempSrc:  Oral Oral Oral  Resp:  16 16 16   Height:      Weight:      SpO2: 94% 98% 98% 98%   Filed Weights   11/21/15 2316 11/22/15 1128  Weight: 52.164 kg (115 lb) 52.164 kg (115 lb)   Body mass index is 16.05 kg/(m^2).   Gen Exam: Awake and alert with clear speech. Not in any distress  Neck: Supple, No JVD.   Chest: B/L Clear.   CVS: S1 S2 Regular, no murmurs.  Abdomen: soft, BS +, non tender, non distended.  Extremities: no edema, lower extremities warm to touch. Neurologic: Non Focal.   Skin: No Rash or lesions   Wounds: N/A.   LABORATORY DATA: CBC:  Recent Labs Lab 11/22/15 0003  WBC 7.6  NEUTROABS 5.3  HGB 12.5*  HCT 35.4*  MCV 96.2  PLT [id number]    Basic Metabolic Panel:  Recent Labs Lab 11/22/15 0003 11/23/15 0740  NA 125* 131*  K 3.5 4.0  CL 92* 99*  CO2 25 28  GLUCOSE 121* 114*  BUN 7 <5*  CREATININE 0.39* 0.44*  CALCIUM 8.6* 8.9    GFR: Estimated Creatinine Clearance: 70.7 mL/min (by C-G formula based on Cr of 0.44).  Liver Function Tests:  Recent Labs Lab 11/22/15 0003  AST 18  ALT 10*  ALKPHOS 61  BILITOT 0.3  PROT 7.1  ALBUMIN 3.6  Recent Labs Lab 11/22/15 0003  LIPASE 41   No results for input(s): AMMONIA in the last 168 hours.  Coagulation Profile: No results for input(s): INR, PROTIME in the last 168 hours.  Cardiac Enzymes: No results for input(s): CKTOTAL, CKMB, CKMBINDEX, TROPONINI in the last 168 hours.  BNP (last 3 results) No results for input(s): PROBNP in the last 8760 hours.  HbA1C: No results for input(s): HGBA1C in the last 72 hours.  CBG: No results for input(s): GLUCAP in the last 168 hours.  Lipid Profile: No results for input(s): CHOL, HDL, LDLCALC, TRIG, CHOLHDL, LDLDIRECT in the last 72 hours.  Thyroid Function Tests: No results for input(s): TSH, T4TOTAL, FREET4,  T3FREE, THYROIDAB in the last 72 hours.  Anemia Panel: No results for input(s): VITAMINB12, FOLATE, FERRITIN, TIBC, IRON, RETICCTPCT in the last 72 hours.  Urine analysis:    Component Value Date/Time   COLORURINE YELLOW 11/22/2015 0029   APPEARANCEUR CLEAR 11/22/2015 0029   LABSPEC 1.010 11/22/2015 0029   PHURINE 8.0 11/22/2015 0029   GLUCOSEU NEGATIVE 11/22/2015 0029   HGBUR TRACE* 11/22/2015 0029   BILIRUBINUR NEGATIVE 11/22/2015 0029   KETONESUR NEGATIVE 11/22/2015 0029   PROTEINUR NEGATIVE 11/22/2015 0029   NITRITE NEGATIVE 11/22/2015 0029   LEUKOCYTESUR SMALL* 11/22/2015 0029    Sepsis Labs: Lactic Acid, Venous No results found for: LATICACIDVEN  MICROBIOLOGY: No results found for this or any previous visit (from the past 240 hour(s)).  RADIOLOGY STUDIES/RESULTS: Ct Abdomen Pelvis W Contrast  11/22/2015  CLINICAL DATA:  65 year old male with abdominal pain. EXAM: CT ABDOMEN AND PELVIS WITH CONTRAST TECHNIQUE: Multidetector CT imaging of the abdomen and pelvis was performed using the standard protocol following bolus administration of intravenous contrast. CONTRAST:  187mL ISOVUE-300 IOPAMIDOL (ISOVUE-300) INJECTION 61% COMPARISON:  CT dated 11/11/2015 FINDINGS: There is emphysematous changes of the lung bases. Focal area of scarring and calcification noted at the left lung base. No intra-abdominal free air. There is diffuse mesenteric stranding and edema as well as small ascites, increased compared to the most recent study of 11/11/2015. There are two subcentimeter hypodense densities in the right lobe of the liver inferiorly adjacent the gallbladder which are incompletely characterized. Metastatic disease is not excluded. MRI without and with contrast may provide better characterisation. A faint subcentimeter hypodensity is also noted in the left lobe of the liver is superiorly (series 2, image 12). There is mild irregularity of the hepatic contour. The gallbladder appears  unremarkable. The head of the pancreas appears unremarkable. There is a large hypo enhancing lesion in the body of the pancreas measuring approximately 4.3 x 4.0 cm grossly similar to prior study. Difference in measurement between the 2 studies is likely related to measurement technique. There is atrophy of the distal gland with dilatation of the duct. This lesion most likely represents a primary pancreatic malignancy. The spleen, adrenal glands, kidneys, visualized ureters, and urinary bladder appear unremarkable. The prostate and seminal vesicles are grossly unremarkable. There is abutment of the distal duodenum at the ligament of Treitz by the pancreatic mass. There is no evidence of bowel obstruction or active inflammation. Moderate stool noted throughout the colon. The appendix appears unremarkable. 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 Focal area of calcified atherosclerotic plaque inferior to the renal arteries with moderate narrowing of the lumen of the aorta (series 8, image 17). The origins of the celiac axis, SMA, and IMA appear patent. There is abutment of the proximal aspect of the celiac artery as well as the SMA  by the pancreatic mass without definite evidence of encasement. There is encasement of the splenic artery. There is compression of the porta splenic confluence by the pancreatic mass as well as narrowing of the central SMV. The SMV, and main portal vein however remain patent. No the splenic vein is not visualized and appears to be occluded by the mass. No portal venous gas identified. Multiple small upper abdominal and mesenteric lymph nodes noted. A nodular density posterior to the pancreatic mass and anterior to the left renal vein noted measuring 7 mm in short axis (series 2, image 27). There is osteopenia with degenerative changes of the spine. No acute fracture. Probable small bone island and in the left acetabulum anteriorly IMPRESSION: Diffuse mesenteric edema and small  ascites with interval increase in the size of the free fluid compared to prior study. Large hypo enhancing pancreatic mass as described most compatible with primary pancreatic neoplasm. There is abutment of the celiac axis, SMA and encasement of the splenic artery. The mass occludes the splenic vein and compresses the porta splenic confluence Abutment of the distal duodenum at the ligament of Treitz peripancreatic mass. No bowel obstruction. Faint subcentimeter hepatic hypodense lesions, not characterized. Metastatic disease is not excluded. Further evaluation with MRI without and with contrast recommended. Electronically Signed   By: Anner Crete M.D.   On: 11/22/2015 03:56   Ct Abdomen Pelvis W Contrast  11/12/2015  CLINICAL DATA:  Intermittent abdominal pain for 3 weeks, worse today. EXAM: CT ABDOMEN AND PELVIS WITH CONTRAST TECHNIQUE: Multidetector CT imaging of the abdomen and pelvis was performed using the standard protocol following bolus administration of intravenous contrast. CONTRAST:  158mL ISOVUE-300 IOPAMIDOL (ISOVUE-300) INJECTION 61% COMPARISON:  None. FINDINGS: There is an irregular hypodense 4.7 cm mass involving the body of the pancreas just to the left of midline. 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 It encases the splenic artery and has a large zone of contact with the celiac axis. There also is contact with the superior mesenteric artery although it is not encased. There is deformity and narrowing of the confluence of the superior mesenteric vein and portal vein, with narrowing of the superior mesenteric vein. This is seen to best advantage on coronal image 27 series 3. There may also be involvement of the distal duodenum at the ligament of Treitz, suggested on sagittal image 60 series 4 and coronal image 29 series 3. There is chronic ductal dilatation and atrophy of the pancreatic tail. The pancreatic head is normal in  appearance. No hepatic metastases are evident. There is mild generalized fatty infiltration of the liver. Multiple small nodes are present in the upper abdomen. There are normal appearances of the adrenals and kidneys. Abdominal aorta is normal in caliber and heavily calcified. Bowel is unremarkable. There is no significant abnormality in the lower chest. There is no significant skeletal lesion. IMPRESSION: 4.7 cm mass of the pancreas just to the left of midline, likely a primary malignancy. Involvement of several vascular structures, also possibly the distal duodenum. These results were called by telephone at the time of interpretation on 11/12/2015 at 12:19 am to Dr. Lily Kocher , who verbally acknowledged these results. Electronically Signed   By: Andreas Newport M.D.   On: 11/12/2015 00:30     LOS: 1 day   Oren Binet, MD  [facility] Pager:[phone]  If 7PM-7AM, please contact night-coverage [email or web address] Password [facility] 11/23/2015, 9:54 AM

## 2015-11-23 NOTE — Plan of Care (Signed)
Problem: Activity: Goal: Risk for activity intolerance will decrease Outcome: Progressing Patient ambulated in the hallway, finished one lap.

## 2015-11-24 ENCOUNTER — Encounter (HOSPITAL_COMMUNITY): Payer: Self-pay | Admitting: Anesthesiology

## 2015-11-24 DIAGNOSIS — R1084 Generalized abdominal pain: Secondary | ICD-10-CM

## 2015-11-24 DIAGNOSIS — R933 Abnormal findings on diagnostic imaging of other parts of digestive tract: Secondary | ICD-10-CM

## 2015-11-24 LAB — BASIC METABOLIC PANEL
Anion gap: 6 (ref 5–15)
BUN: 7 mg/dL (ref 6–20)
CALCIUM: 8.7 mg/dL — AB (ref 8.9–10.3)
CO2: 27 mmol/L (ref 22–32)
CREATININE: 0.63 mg/dL (ref 0.61–1.24)
Chloride: 98 mmol/L — ABNORMAL LOW (ref 101–111)
GFR calc non Af Amer: >60 mL/min (ref >60)
GLUCOSE: 122 mg/dL — AB (ref 65–99)
Potassium: 3.9 mmol/L (ref 3.5–5.1)
Sodium: 131 mmol/L — ABNORMAL LOW (ref 135–145)

## 2015-11-24 NOTE — Progress Notes (Signed)
Initial Nutrition Assessment  DOCUMENTATION CODES:   Severe malnutrition in context of chronic illness  INTERVENTION:  -Ensure Enlive po BID, each supplement provides 350 kcal and 20 grams of protein -RD to continue to monitor  NUTRITION DIAGNOSIS:   Malnutrition related to chronic illness as evidenced by severe depletion of body fat, severe depletion of muscle mass.  GOAL:   Patient will meet greater than or equal to 90% of their needs  MONITOR:   PO intake, Supplement acceptance, Labs, I & O's  REASON FOR ASSESSMENT:   Malnutrition Screening Tool    ASSESSMENT:   65 yo male with h/o recent pancreatic mass found a couple of weeks ago sent home by dr Sarajane Jews with 60 pain pills and an appt on may 18 in [place] with GI to have biopsy done of his pancreatic mass.  Spoke with Mr. Koglin at bedside. He is a pleasant 65 yo man who has a new pancreatic mass. He was supposed to get a biopsy on may 18th but his friend was unable to give him a ride and he missed his appt.   He presented with abdominal pain, has been receiving hydrocodone and morphine if needed. He is having his biopsy tomorrow after which he stated he will be observed and go home.   He endorses good appetite, no issues eating PTA but he has continued to lose weight.  He believes that being on "a liquid diet" during admission has contributed heavily to his wt loss but he appears severely malnourished following NFPE -> Severe muscle wasting, severe fat depletion, no edema.  He states a usual body weight of 140# but was unable to identify the last time he weighed in that high. Per chart review, he exhibits a 15#/11% insignificant wt loss over 1 year. He was 130# last year, 140# as of 01/2014.  He was eating his first solid food tray during my visit. Sweet Potato casserole, chicken, macaroni and cheese which he consumed 100% of. He states that he was doing fine on liquid diet but got tired of it.  Labs and  medications reviewed: Na 131  Diet Order:  DIET SOFT Room service appropriate?: Yes; Fluid consistency:: Thin Diet NPO time specified Except for: Sips with Meds  Skin:  Reviewed, no issues  Last BM:  5/22  Height:   Ht Readings from Last 1 Encounters:  11/22/15 5\' 11"  (1.803 m)    Weight:   Wt Readings from Last 1 Encounters:  11/22/15 115 lb (52.164 kg)    Ideal Body Weight:  78.18 kg  BMI:  Body mass index is 16.05 kg/(m^2).  Estimated Nutritional Needs:   Kcal:  1550-1850  Protein:  65-80 grams  Fluid:  >/= 1.5 L  EDUCATION NEEDS:   No education needs identified at this time  Satira Anis. Sinead Hockman, MS, RD LDN Inpatient Clinical Dietitian Pager [phone]

## 2015-11-24 NOTE — Anesthesia Preprocedure Evaluation (Addendum)
Anesthesia Evaluation  Patient identified by MRN, date of birth, ID band Patient awake    Reviewed: Allergy & Precautions, NPO status , Patient's Chart, lab work & pertinent test results  Airway Mallampati: II  TM Distance: >3 FB Neck ROM: Full    Dental no notable dental hx.    Pulmonary neg pulmonary ROS, Current Smoker,    Pulmonary exam normal breath sounds clear to auscultation       Cardiovascular negative cardio ROS Normal cardiovascular exam Rhythm:Regular Rate:Normal     Neuro/Psych PSYCHIATRIC DISORDERS negative neurological ROS     GI/Hepatic negative GI ROS, Neg liver ROS,   Endo/Other  negative endocrine ROS  Renal/GU negative Renal ROS  negative genitourinary   Musculoskeletal negative musculoskeletal ROS (+)   Abdominal   Peds negative pediatric ROS (+)  Hematology negative hematology ROS (+)   Anesthesia Other Findings   Reproductive/Obstetrics negative OB ROS                             Anesthesia Physical Anesthesia Plan  ASA: II  Anesthesia Plan: MAC   Post-op Pain Management:    Induction: Intravenous  Airway Management Planned: Natural Airway  Additional Equipment:   Intra-op Plan:   Post-operative Plan:   Informed Consent: I have reviewed the patients History and Physical, chart, labs and discussed the procedure including the risks, benefits and alternatives for the proposed anesthesia with the patient or authorized representative who has indicated his/her understanding and acceptance.   Dental advisory given  Plan Discussed with: CRNA  Anesthesia Plan Comments:         Anesthesia Quick Evaluation

## 2015-11-24 NOTE — Progress Notes (Signed)
PROGRESS NOTE        PATIENT DETAILS Name: Nicholas [name] Age: 65 y.o. Sex: male Date of Birth: Jun 10, 1951 Admit Date: 11/21/2015 Admitting Physician Phillips Grout, MD [id number] NOT IN SYSTEM   Brief Narrative: Patient is a 65 y.o. male known pancreatic mass, admitted with worsening abdominal pain and hyponatremia  Subjective: Abdominal pain controlled with oral narcotics.   Assessment/Plan: Principal Problem: Abdominal pain, acute, generalized: Secondary to underlying pancreatic mass, pain now controlled with oral narcotics, will stop e IV narcotics. Abdomen is soft and nontender, advance to soft diet. GI evaluation completed, EUS scheduled from 5/23. I will try to set him up at the cancer center in [facility]. Marland Kitchen  Active Problems: Hyponatremia: Has mild hyponatremia-euvolemic on exam, encourage oral intake.   Pancreatic mass: Recent CT scan abdomen showed a 4.7 cm pancreatic max CA 19-9 markedly elevated, plans are for EUS at some point. Await GI evaluation  Tobacco abuse: Counseled.  DVT Prophylaxis: Prophylactic Lovenox   Code Status: Full code   Family Communication: Non at bedside  Disposition Plan: Remain inpatient-home 5/23 after EUS  Antimicrobial agents: None  Procedures: None  CONSULTS:  GI  Time spent: 25 minutes-Greater than 50% of this time was spent in counseling, explanation of diagnosis, planning of further management, and coordination of care.  MEDICATIONS: Anti-infectives    None      Scheduled Meds: . feeding supplement (ENSURE ENLIVE)  237 mL Oral BID BM  . polyethylene glycol  17 g Oral Daily   Continuous Infusions:  PRN Meds:.acetaminophen, albuterol, ondansetron **OR** ondansetron (ZOFRAN) IV, oxyCODONE   PHYSICAL EXAM: Vital signs: Filed Vitals:   11/23/15 0359 11/23/15 1347 11/23/15 2046 11/24/15 0456  BP: 165/84 161/97 147/82 156/85  Pulse: 65 62 68 66  Temp: 98 F (36.7 C) 98 F (36.7 C)  98.2 F (36.8 C) 97.4 F (36.3 C)  TempSrc: Oral Oral Oral Oral  Resp: 16 16 16 16   Height:      Weight:      SpO2: 98% 100% 99% 98%   Filed Weights   11/21/15 2316 11/22/15 1128  Weight: 52.164 kg (115 lb) 52.164 kg (115 lb)   Body mass index is 16.05 kg/(m^2).   Gen Exam: Awake and alert with clear speech. Not in any distress  Neck: Supple, No JVD.   Chest: B/L Clear.   CVS: S1 S2 Regular, no murmurs.  Abdomen: soft, BS +, non tender, non distended.  Extremities: no edema, lower extremities warm to touch. Neurologic: Non Focal.   Skin: No Rash or lesions   Wounds: N/A.   LABORATORY DATA: CBC:  Recent Labs Lab 11/22/15 0003  WBC 7.6  NEUTROABS 5.3  HGB 12.5*  HCT 35.4*  MCV 96.2  PLT [id number]    Basic Metabolic Panel:  Recent Labs Lab 11/22/15 0003 11/23/15 0740 11/24/15 0401  NA 125* 131* 131*  K 3.5 4.0 3.9  CL 92* 99* 98*  CO2 25 28 27   GLUCOSE 121* 114* 122*  BUN 7 <5* 7  CREATININE 0.39* 0.44* 0.63  CALCIUM 8.6* 8.9 8.7*    GFR: Estimated Creatinine Clearance: 70.7 mL/min (by C-G formula based on Cr of 0.63).  Liver Function Tests:  Recent Labs Lab 11/22/15 0003  AST 18  ALT 10*  ALKPHOS 61  BILITOT 0.3  PROT 7.1  ALBUMIN 3.6  Recent Labs Lab 11/22/15 0003  LIPASE 41   No results for input(s): AMMONIA in the last 168 hours.  Coagulation Profile: No results for input(s): INR, PROTIME in the last 168 hours.  Cardiac Enzymes: No results for input(s): CKTOTAL, CKMB, CKMBINDEX, TROPONINI in the last 168 hours.  BNP (last 3 results) No results for input(s): PROBNP in the last 8760 hours.  HbA1C: No results for input(s): HGBA1C in the last 72 hours.  CBG: No results for input(s): GLUCAP in the last 168 hours.  Lipid Profile: No results for input(s): CHOL, HDL, LDLCALC, TRIG, CHOLHDL, LDLDIRECT in the last 72 hours.  Thyroid Function Tests: No results for input(s): TSH, T4TOTAL, FREET4, T3FREE, THYROIDAB in the last 72  hours.  Anemia Panel: No results for input(s): VITAMINB12, FOLATE, FERRITIN, TIBC, IRON, RETICCTPCT in the last 72 hours.  Urine analysis:    Component Value Date/Time   COLORURINE YELLOW 11/22/2015 0029   APPEARANCEUR CLEAR 11/22/2015 0029   LABSPEC 1.010 11/22/2015 0029   PHURINE 8.0 11/22/2015 0029   GLUCOSEU NEGATIVE 11/22/2015 0029   HGBUR TRACE* 11/22/2015 0029   BILIRUBINUR NEGATIVE 11/22/2015 0029   KETONESUR NEGATIVE 11/22/2015 0029   PROTEINUR NEGATIVE 11/22/2015 0029   NITRITE NEGATIVE 11/22/2015 0029   LEUKOCYTESUR SMALL* 11/22/2015 0029    Sepsis Labs: Lactic Acid, Venous No results found for: LATICACIDVEN  MICROBIOLOGY: No results found for this or any previous visit (from the past 240 hour(s)).  RADIOLOGY STUDIES/RESULTS: Ct Abdomen Pelvis W Contrast  11/22/2015  CLINICAL DATA:  65 year old male with abdominal pain. EXAM: CT ABDOMEN AND PELVIS WITH CONTRAST TECHNIQUE: Multidetector CT imaging of the abdomen and pelvis was performed using the standard protocol following bolus administration of intravenous contrast. CONTRAST:  181mL ISOVUE-300 IOPAMIDOL (ISOVUE-300) INJECTION 61% COMPARISON:  CT dated 11/11/2015 FINDINGS: There is emphysematous changes of the lung bases. Focal area of scarring and calcification noted at the left lung base. No intra-abdominal free air. There is diffuse mesenteric stranding and edema as well as small ascites, increased compared to the most recent study of 11/11/2015. There are two subcentimeter hypodense densities in the right lobe of the liver inferiorly adjacent the gallbladder which are incompletely characterized. Metastatic disease is not excluded. MRI without and with contrast may provide better characterisation. A faint subcentimeter hypodensity is also noted in the left lobe of the liver is superiorly (series 2, image 12). There is mild irregularity of the hepatic contour. The gallbladder appears unremarkable. The head of the pancreas  appears unremarkable. There is a large hypo enhancing lesion in the body of the pancreas measuring approximately 4.3 x 4.0 cm grossly similar to prior study. Difference in measurement between the 2 studies is likely related to measurement technique. There is atrophy of the distal gland with dilatation of the duct. This lesion most likely represents a primary pancreatic malignancy. The spleen, adrenal glands, kidneys, visualized ureters, and urinary bladder appear unremarkable. The prostate and seminal vesicles are grossly unremarkable. There is abutment of the distal duodenum at the ligament of Treitz by the pancreatic mass. There is no evidence of bowel obstruction or active inflammation. Moderate stool noted throughout the colon. The appendix appears unremarkable. 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 Focal area of calcified atherosclerotic plaque inferior to the renal arteries with moderate narrowing of the lumen of the aorta (series 8, image 17). The origins of the celiac axis, SMA, and IMA appear patent. There is abutment of the proximal aspect of the celiac artery as well as the SMA  by the pancreatic mass without definite evidence of encasement. There is encasement of the splenic artery. There is compression of the porta splenic confluence by the pancreatic mass as well as narrowing of the central SMV. The SMV, and main portal vein however remain patent. No the splenic vein is not visualized and appears to be occluded by the mass. No portal venous gas identified. Multiple small upper abdominal and mesenteric lymph nodes noted. A nodular density posterior to the pancreatic mass and anterior to the left renal vein noted measuring 7 mm in short axis (series 2, image 27). There is osteopenia with degenerative changes of the spine. No acute fracture. Probable small bone island and in the left acetabulum anteriorly IMPRESSION: Diffuse mesenteric edema and small ascites with interval increase in the  size of the free fluid compared to prior study. Large hypo enhancing pancreatic mass as described most compatible with primary pancreatic neoplasm. There is abutment of the celiac axis, SMA and encasement of the splenic artery. The mass occludes the splenic vein and compresses the porta splenic confluence Abutment of the distal duodenum at the ligament of Treitz peripancreatic mass. No bowel obstruction. Faint subcentimeter hepatic hypodense lesions, not characterized. Metastatic disease is not excluded. Further evaluation with MRI without and with contrast recommended. Electronically Signed   By: Anner Crete M.D.   On: 11/22/2015 03:56   Ct Abdomen Pelvis W Contrast  11/12/2015  CLINICAL DATA:  Intermittent abdominal pain for 3 weeks, worse today. EXAM: CT ABDOMEN AND PELVIS WITH CONTRAST TECHNIQUE: Multidetector CT imaging of the abdomen and pelvis was performed using the standard protocol following bolus administration of intravenous contrast. CONTRAST:  127mL ISOVUE-300 IOPAMIDOL (ISOVUE-300) INJECTION 61% COMPARISON:  None. FINDINGS: There is an irregular hypodense 4.7 cm mass involving the body of the pancreas just to the left of midline. 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 It encases the splenic artery and has a large zone of contact with the celiac axis. There also is contact with the superior mesenteric artery although it is not encased. There is deformity and narrowing of the confluence of the superior mesenteric vein and portal vein, with narrowing of the superior mesenteric vein. This is seen to best advantage on coronal image 27 series 3. There may also be involvement of the distal duodenum at the ligament of Treitz, suggested on sagittal image 60 series 4 and coronal image 29 series 3. There is chronic ductal dilatation and atrophy of the pancreatic tail. The pancreatic head is normal in appearance. No hepatic metastases are  evident. There is mild generalized fatty infiltration of the liver. Multiple small nodes are present in the upper abdomen. There are normal appearances of the adrenals and kidneys. Abdominal aorta is normal in caliber and heavily calcified. Bowel is unremarkable. There is no significant abnormality in the lower chest. There is no significant skeletal lesion. IMPRESSION: 4.7 cm mass of the pancreas just to the left of midline, likely a primary malignancy. Involvement of several vascular structures, also possibly the distal duodenum. These results were called by telephone at the time of interpretation on 11/12/2015 at 12:19 am to Dr. Lily Kocher , who verbally acknowledged these results. Electronically Signed   By: Andreas Newport M.D.   On: 11/12/2015 00:30     LOS: 2 days   Oren Binet, MD  [facility] Pager:[phone]  If 7PM-7AM, please contact night-coverage [email or web address] Password [facility] 11/24/2015, 9:44 AM

## 2015-11-24 NOTE — Consult Note (Signed)
[facility] Referring Provider: Dr. Hildred Laser Primary Care Physician:  PROVIDER NOT [place]  Reason for Consultation: Pancreatic mass   HPI:  Nicholas [name] is a 65 y.o. male whom I heard about 10 days ago when I spoke with Dr. Laural Golden. He was admitted at [facility] with severe abd pains, found to have a large mass in body/tail of pancreas.  We discussed out patient EUS. That was scheduled for last week however the patient called the morning of the procedure, unable to get to [facility] because he had no ride.  We were working on getting this rescheduled as an outpatient but he was readmitted for pain control three days ago to [facility].  He was transferred to [facility] shortly after admission.  He's had 30 pound weight loss, abd pain that radiates to back.  Poor appetite. + chronic etoh use but doesn't sound to be abusive.  Never had pancreas problems in the past and no FH of pancreatic disease that he is aware of.  I reviewed imaging reports and images, labs personally  LFTs normal CBC normal CA 19-9 1560   Past Medical History  Diagnosis Date  . Cancer (HCC)     Skin cancer-ear, skin cancer -leeg,cancer of leg muscle- left.  . TB (pulmonary tuberculosis)     3 yrs ago-tx" positive TB test aslso" was told he is not contagious.  . Abdominal pain in male     "was told he has a Mass near pancreas"    Past Surgical History  Procedure Laterality Date  . Leg surgery Left     " cancer removed from muscle"  . Colonoscopy w/ polypectomy      Prior to Admission medications   Medication Sig Start Date End Date Taking? Authorizing Provider  aspirin EC 81 MG tablet Take 81 mg by mouth daily.   Yes Historical Provider, MD  hydrocerin (EUCERIN) CREA Apply 1 application topically 2 (two) times daily as needed (For dry skin.).   Yes Historical Provider, MD  HYDROcodone-acetaminophen (NORCO/VICODIN) 5-325 MG tablet Take 1-2 tablets by mouth every 6 (six) hours as needed for moderate  pain. 11/13/15  Yes Samuella Cota, MD  Multiple Vitamins-Minerals (MULTIVITAMINS THER. W/MINERALS) TABS tablet Take 1 tablet by mouth daily.   Yes Historical Provider, MD    Current Facility-Administered Medications  Medication Dose Route Frequency Provider Last Rate Last Dose  . acetaminophen (TYLENOL) tablet 650 mg  650 mg Oral Q6H PRN Shanker Kristeen Mans, MD      . albuterol (PROVENTIL) (2.5 MG/3ML) 0.083% nebulizer solution 2.5 mg  2.5 mg Nebulization Q2H PRN Shanker Kristeen Mans, MD      . enoxaparin (LOVENOX) injection 40 mg  40 mg Subcutaneous Q24H Jonetta Osgood, MD   40 mg at 11/23/15 1032  . feeding supplement (ENSURE ENLIVE) (ENSURE ENLIVE) liquid 237 mL  237 mL Oral BID BM Nita Sells, MD      . ondansetron (ZOFRAN) tablet 4 mg  4 mg Oral Q6H PRN Phillips Grout, MD       Or  . ondansetron (ZOFRAN) injection 4 mg  4 mg Intravenous Q6H PRN Phillips Grout, MD      . oxyCODONE (Oxy IR/ROXICODONE) immediate release tablet 10 mg  10 mg Oral Q4H PRN Jonetta Osgood, MD   10 mg at 11/23/15 2049  . polyethylene glycol (MIRALAX / GLYCOLAX) packet 17 g  17 g Oral Daily Shanker Kristeen Mans, MD   17 g at  11/23/15 0924    Allergies as of 11/21/2015  . (No Known Allergies)    Family History  Problem Relation Age of Onset  . Pneumonia Father   . Stroke Mother     Social History   Social History  . Marital Status: Single    Spouse Name: N/A  . Number of Children: N/A  . Years of Education: N/A   Occupational History  . Not on file.   Social History Main Topics  . Smoking status: Current Every Day Smoker -- 0.50 packs/day for 30 years    Types: Cigarettes  . Smokeless tobacco: Not on file  . Alcohol Use: 0.0 oz/week    0 Standard drinks or equivalent per week     Comment: beer at night, wine   . Drug Use: No  . Sexual Activity: No   Other Topics Concern  . Not on file   Social History Narrative     Review of Systems: Pertinent positive and negative review of  systems were noted in the above HPI section. Complete review of systems was performed and was otherwise normal.   Physical Exam: Vital signs in last 24 hours: Temp:  [97.4 F (36.3 C)-98.2 F (36.8 C)] 97.4 F (36.3 C) (05/22 0456) Pulse Rate:  [62-68] 66 (05/22 0456) Resp:  [16] 16 (05/22 0456) BP: (147-161)/(82-97) 156/85 mmHg (05/22 0456) SpO2:  [98 %-100 %] 98 % (05/22 0456) Last BM Date: 11/21/15 Constitutional: generally well-appearing Psychiatric: alert and oriented x3 Eyes: extraocular movements intact Mouth: oral pharynx moist, no lesions Neck: supple no lymphadenopathy Cardiovascular: heart regular rate and rhythm Lungs: clear to auscultation bilaterally Abdomen: soft, nontender, nondistended, no obvious ascites, no peritoneal signs, normal bowel sounds Extremities: no lower extremity edema bilaterally Skin: no lesions on visible extremities   Lab Results:  Recent Labs  11/22/15 0003  WBC 7.6  HGB 12.5*  HCT 35.4*  PLT 240  MCV 96.2   BMET  Recent Labs  11/22/15 0003 11/23/15 0740 11/24/15 0401  NA 125* 131* 131*  K 3.5 4.0 3.9  CL 92* 99* 98*  CO2 25 28 27   GLUCOSE 121* 114* 122*  BUN 7 <5* 7  CREATININE 0.39* 0.44* 0.63  CALCIUM 8.6* 8.9 8.7*   LFT  Recent Labs  11/22/15 0003  BILITOT 0.3  AST 18  ALT 10*  ALKPHOS 61  PROT 7.1  ALBUMIN 3.6    Impression/Plan: 65 y.o. male with large mass in body of pancreas that clearly abuts celiac trunk, SMA, portal vein at level of the confluenc  Likely advanced pancreatic cancer, inoperable due to major blood vessel involvement. CA 19-9 very elevated, this raises suspicion that it is metastatic as well, ?diffuse intraperitoneal involvement.  Will plan on upper EUS tomorrow AM.  I've ordered NPO after MN and d/c of lovenox for now. He will likely be OK for d/c after that as long as he has oral pain control (?+ patch) and appointments set to meet oncology closer to home.    Milus Banister, MD   11/24/2015, 9:13 AM [facility] Pager [phone]

## 2015-11-24 NOTE — Care Management Note (Signed)
Case Management Note  Patient Details  Name: BURNETT SISTARE MRN: [id number] Date of Birth: March 05, 1951  Subjective/Objective:      65 yo admitted with ABD pain              Action/Plan: From home alone.  Chart reviewed and no CM needs identified or communicated.    Expected Discharge Date:  11/26/15               Expected Discharge Plan:  Home/Self Care  In-House Referral:     Discharge planning Services  CM Consult  Post Acute Care Choice:    Choice offered to:     DME Arranged:    DME Agency:     HH Arranged:    HH Agency:     Status of Service:  Completed, signed off  Medicare Important Message Given:    Date Medicare IM Given:    Medicare IM give by:    Date Additional Medicare IM Given:    Additional Medicare Important Message give by:     If discussed at [place] of Stay Meetings, dates discussed:    Additional CommentsLynnell Catalan, RN 11/24/2015, 3:48 PM  [phone]

## 2015-11-25 ENCOUNTER — Encounter (HOSPITAL_COMMUNITY)
Admission: EM | Disposition: A | Discharge status: Home / Self Care | Payer: Self-pay | Source: Home / Self Care | Attending: Internal Medicine

## 2015-11-25 ENCOUNTER — Inpatient Hospital Stay (HOSPITAL_COMMUNITY): Payer: Medicaid Other | Admitting: Anesthesiology

## 2015-11-25 ENCOUNTER — Encounter (HOSPITAL_COMMUNITY): Payer: Self-pay | Admitting: Certified Registered"

## 2015-11-25 DIAGNOSIS — K8689 Other specified diseases of pancreas: Secondary | ICD-10-CM

## 2015-11-25 HISTORY — PX: EUS: SHX5427

## 2015-11-25 SURGERY — ESOPHAGEAL ENDOSCOPIC ULTRASOUND (EUS) RADIAL
Anesthesia: Monitor Anesthesia Care

## 2015-11-25 SURGERY — UPPER ENDOSCOPIC ULTRASOUND (EUS) LINEAR
Anesthesia: Monitor Anesthesia Care

## 2015-11-25 MED ORDER — PROPOFOL 500 MG/50ML IV EMUL
INTRAVENOUS | Status: DC | PRN
  Administered 2015-11-25: 125 ug/kg/min via INTRAVENOUS

## 2015-11-25 MED ORDER — LIDOCAINE HCL (CARDIAC) 20 MG/ML IV SOLN
INTRAVENOUS | Status: DC | PRN
  Administered 2015-11-25 (×2): 50 mg via INTRAVENOUS

## 2015-11-25 MED ORDER — LIDOCAINE HCL (CARDIAC) 20 MG/ML IV SOLN
INTRAVENOUS | Status: AC
  Filled 2015-11-25: qty 5

## 2015-11-25 MED ORDER — POLYETHYLENE GLYCOL 3350 17 G PO PACK: 17.0000 g | PACK | Freq: Every day | ORAL | Status: DC

## 2015-11-25 MED ORDER — PROPOFOL 10 MG/ML IV BOLUS
INTRAVENOUS | Status: AC
  Filled 2015-11-25: qty 40

## 2015-11-25 MED ORDER — PROPOFOL 10 MG/ML IV BOLUS
INTRAVENOUS | Status: DC | PRN
  Administered 2015-11-25: 50 mg via INTRAVENOUS

## 2015-11-25 MED ORDER — ENSURE ENLIVE PO LIQD: 237.0000 mL | Freq: Two times a day (BID) | ORAL | Status: DC

## 2015-11-25 MED ORDER — LACTATED RINGERS IV SOLN
INTRAVENOUS | Status: DC | PRN
  Administered 2015-11-25: 08:00:00 via INTRAVENOUS

## 2015-11-25 MED ORDER — OXYCODONE HCL 10 MG PO TABS: 10.0000 mg | ORAL_TABLET | Freq: Four times a day (QID) | ORAL | Status: DC | PRN

## 2015-11-25 NOTE — Anesthesia Postprocedure Evaluation (Signed)
Anesthesia Post Note  Patient: Nicholas [name]  Procedure(s) Performed: Procedure(s) (LRB): ESOPHAGEAL ENDOSCOPIC ULTRASOUND (EUS) RADIAL (N/A)  Patient location during evaluation: PACU Anesthesia Type: MAC Level of consciousness: awake and alert Pain management: pain level controlled Vital Signs Assessment: post-procedure vital signs reviewed and stable Respiratory status: spontaneous breathing, nonlabored ventilation, respiratory function stable and patient connected to nasal cannula oxygen Cardiovascular status: stable and blood pressure returned to baseline Anesthetic complications: no     Last Vitals:  Filed Vitals:   11/25/15 0920 11/25/15 0930  BP: 145/84 152/80  Pulse:    Temp:    Resp: 20 16    Last Pain:  Filed Vitals:   11/25/15 0934  PainSc: 5    Pain Goal: Patients Stated Pain Goal: 0 (11/25/15 0800)               [name] J

## 2015-11-25 NOTE — Discharge Summary (Addendum)
PATIENT DETAILS Name: Nicholas [name] Age: 65 y.o. Sex: male Date of Birth: 01/27/1951 MRN: [id number]. Admitting Physician: Phillips Grout, MD [id number] NOT IN SYSTEM  Admit Date: 11/21/2015 Discharge date: 11/25/2015  Recommendations for Outpatient Follow-up:  1. Follow EUS bx results 2. Ensure follow up with Oncology-appt made for 5/30   PRIMARY DISCHARGE DIAGNOSIS:  Principal Problem:   Abdominal pain, acute, generalized Active Problems:   Pancreatic mass   Hyponatremia   Tobacco use disorder   Abdominal pain      PAST MEDICAL HISTORY: Past Medical History  Diagnosis Date  . Cancer (HCC)     Skin cancer-ear, skin cancer -leeg,cancer of leg muscle- left.  . TB (pulmonary tuberculosis)     3 yrs ago-tx" positive TB test aslso" was told he is not contagious.  . Abdominal pain in male     "was told he has a Mass near pancreas"    DISCHARGE MEDICATIONS: Current Discharge Medication List    START taking these medications   Details  feeding supplement, ENSURE ENLIVE, (ENSURE ENLIVE) LIQD Take 237 mLs by mouth 2 (two) times daily between meals. Qty: 60 Bottle, Refills: 0    oxyCODONE 10 MG TABS Take 1 tablet (10 mg total) by mouth every 6 (six) hours as needed for moderate pain. Qty: 45 tablet, Refills: 0    polyethylene glycol (MIRALAX / GLYCOLAX) packet Take 17 g by mouth daily. Qty: 14 each, Refills: 0      CONTINUE these medications which have NOT CHANGED   Details  aspirin EC 81 MG tablet Take 81 mg by mouth daily.    hydrocerin (EUCERIN) CREA Apply 1 application topically 2 (two) times daily as needed (For dry skin.).    Multiple Vitamins-Minerals (MULTIVITAMINS THER. W/MINERALS) TABS tablet Take 1 tablet by mouth daily.      STOP taking these medications     HYDROcodone-acetaminophen (NORCO/VICODIN) 5-325 MG tablet         ALLERGIES:  No Known Allergies  BRIEF HPI:  See H&P, Labs, Consult and Test reports for all details in brief,  Patient is a 65 y.o. male known pancreatic mass, admitted with worsening abdominal pain and hyponatremia  CONSULTATIONS:   GI  PERTINENT RADIOLOGIC STUDIES: Ct Abdomen Pelvis W Contrast  11/22/2015  CLINICAL DATA:  65 year old male with abdominal pain. EXAM: CT ABDOMEN AND PELVIS WITH CONTRAST TECHNIQUE: Multidetector CT imaging of the abdomen and pelvis was performed using the standard protocol following bolus administration of intravenous contrast. CONTRAST:  145mL ISOVUE-300 IOPAMIDOL (ISOVUE-300) INJECTION 61% COMPARISON:  CT dated 11/11/2015 FINDINGS: There is emphysematous changes of the lung bases. Focal area of scarring and calcification noted at the left lung base. No intra-abdominal free air. There is diffuse mesenteric stranding and edema as well as small ascites, increased compared to the most recent study of 11/11/2015. There are two subcentimeter hypodense densities in the right lobe of the liver inferiorly adjacent the gallbladder which are incompletely characterized. Metastatic disease is not excluded. MRI without and with contrast may provide better characterisation. A faint subcentimeter hypodensity is also noted in the left lobe of the liver is superiorly (series 2, image 12). There is mild irregularity of the hepatic contour. The gallbladder appears unremarkable. The head of the pancreas appears unremarkable. There is a large hypo enhancing lesion in the body of the pancreas measuring approximately 4.3 x 4.0 cm grossly similar to prior study. Difference in measurement between the 2 studies is likely related to measurement technique. There  is atrophy of the distal gland with dilatation of the duct. This lesion most likely represents a primary pancreatic malignancy. The spleen, adrenal glands, kidneys, visualized ureters, and urinary bladder appear unremarkable. The prostate and seminal vesicles are grossly unremarkable. There is abutment of the distal duodenum at the ligament of Treitz by  the pancreatic mass. There is no evidence of bowel obstruction or active inflammation. Moderate stool noted throughout the colon. The appendix appears unremarkable. 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 Focal area of calcified atherosclerotic plaque inferior to the renal arteries with moderate narrowing of the lumen of the aorta (series 8, image 17). The origins of the celiac axis, SMA, and IMA appear patent. There is abutment of the proximal aspect of the celiac artery as well as the SMA by the pancreatic mass without definite evidence of encasement. There is encasement of the splenic artery. There is compression of the porta splenic confluence by the pancreatic mass as well as narrowing of the central SMV. The SMV, and main portal vein however remain patent. No the splenic vein is not visualized and appears to be occluded by the mass. No portal venous gas identified. Multiple small upper abdominal and mesenteric lymph nodes noted. A nodular density posterior to the pancreatic mass and anterior to the left renal vein noted measuring 7 mm in short axis (series 2, image 27). There is osteopenia with degenerative changes of the spine. No acute fracture. Probable small bone island and in the left acetabulum anteriorly IMPRESSION: Diffuse mesenteric edema and small ascites with interval increase in the size of the free fluid compared to prior study. Large hypo enhancing pancreatic mass as described most compatible with primary pancreatic neoplasm. There is abutment of the celiac axis, SMA and encasement of the splenic artery. The mass occludes the splenic vein and compresses the porta splenic confluence Abutment of the distal duodenum at the ligament of Treitz peripancreatic mass. No bowel obstruction. Faint subcentimeter hepatic hypodense lesions, not characterized. Metastatic disease is not excluded. Further evaluation with MRI without and with contrast recommended. Electronically Signed   By: Anner Crete M.D.   On: 11/22/2015 03:56   Ct Abdomen Pelvis W Contrast  11/12/2015  CLINICAL DATA:  Intermittent abdominal pain for 3 weeks, worse today. EXAM: CT ABDOMEN AND PELVIS WITH CONTRAST TECHNIQUE: Multidetector CT imaging of the abdomen and pelvis was performed using the standard protocol following bolus administration of intravenous contrast. CONTRAST:  147mL ISOVUE-300 IOPAMIDOL (ISOVUE-300) INJECTION 61% COMPARISON:  None. FINDINGS: There is an irregular hypodense 4.7 cm mass involving the body of the pancreas just to the left of midline. 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 It encases the splenic artery and has a large zone of contact with the celiac axis. There also is contact with the superior mesenteric artery although it is not encased. There is deformity and narrowing of the confluence of the superior mesenteric vein and portal vein, with narrowing of the superior mesenteric vein. This is seen to best advantage on coronal image 27 series 3. There may also be involvement of the distal duodenum at the ligament of Treitz, suggested on sagittal image 60 series 4 and coronal image 29 series 3. There is chronic ductal dilatation and atrophy of the pancreatic tail. The pancreatic head is normal in appearance. No hepatic metastases are evident. There is mild generalized fatty infiltration of the liver. Multiple small nodes are present in the upper abdomen. There are normal  appearances of the adrenals and kidneys. Abdominal aorta is normal in caliber and heavily calcified. Bowel is unremarkable. There is no significant abnormality in the lower chest. There is no significant skeletal lesion. IMPRESSION: 4.7 cm mass of the pancreas just to the left of midline, likely a primary malignancy. Involvement of several vascular structures, also possibly the distal duodenum. These results were called by telephone at the time of interpretation on  11/12/2015 at 12:19 am to Dr. Lily Kocher , who verbally acknowledged these results. Electronically Signed   By: Andreas Newport M.D.   On: 11/12/2015 00:30     PERTINENT LAB RESULTS: CBC: No results for input(s): WBC, HGB, HCT, PLT in the last 72 hours. CMET CMP     Component Value Date/Time   NA 131* 11/24/2015 0401   K 3.9 11/24/2015 0401   CL 98* 11/24/2015 0401   CO2 27 11/24/2015 0401   GLUCOSE 122* 11/24/2015 0401   BUN 7 11/24/2015 0401   CREATININE 0.63 11/24/2015 0401   CALCIUM 8.7* 11/24/2015 0401   PROT 7.1 11/22/2015 0003   ALBUMIN 3.6 11/22/2015 0003   AST 18 11/22/2015 0003   ALT 10* 11/22/2015 0003   ALKPHOS 61 11/22/2015 0003   BILITOT 0.3 11/22/2015 0003   GFRNONAA >60 11/24/2015 0401   GFRAA >60 11/24/2015 0401    GFR Estimated Creatinine Clearance: 70.7 mL/min (by C-G formula based on Cr of 0.63). No results for input(s): LIPASE, AMYLASE in the last 72 hours. No results for input(s): CKTOTAL, CKMB, CKMBINDEX, TROPONINI in the last 72 hours. Invalid input(s): POCBNP No results for input(s): DDIMER in the last 72 hours. No results for input(s): HGBA1C in the last 72 hours. No results for input(s): CHOL, HDL, LDLCALC, TRIG, CHOLHDL, LDLDIRECT in the last 72 hours. No results for input(s): TSH, T4TOTAL, T3FREE, THYROIDAB in the last 72 hours.  Invalid input(s): FREET3 No results for input(s): VITAMINB12, FOLATE, FERRITIN, TIBC, IRON, RETICCTPCT in the last 72 hours. Coags: No results for input(s): INR in the last 72 hours.  Invalid input(s): PT Microbiology: No results found for this or any previous visit (from the past 240 hour(s)).   BRIEF HOSPITAL COURSE:  Abdominal pain, acute, generalized: Secondary to underlying pancreatic mass, pain now controlled with oral narcotics, no longer requiring IV narcotics. Abdomen is soft and nontender, advance to soft diet. GI evaluation completed, underwent EUS on 5/23.Follow up appointment at the the cancer  center at [facility] scheduled for 5/30  Active Problems: Hyponatremia: Has mild hyponatremia-euvolemic on exam, encourage oral intake.Repeat labs at next visit   Pancreatic mass: Recent CT scan abdomen showed a 4.7 cm pancreatic max CA 19-9 markedly elevated, see above regarding appt with oncology. Underwent EUS 5/23-bx results pending at the time of discharge  Tobacco abuse: Counseled.  Severe malnutrition in context of chronic illness  TODAY-DAY OF DISCHARGE:  Subjective:   Nicholas [name] today has no headache,no chest abdominal pain,no new weakness tingling or numbness, feels much better wants to go home today.   Objective:   Blood pressure 152/80, pulse 76, temperature 98.1 F (36.7 C), temperature source Oral, resp. rate 16, height 5\' 11"  (1.803 m), weight 52.164 kg (115 lb), SpO2 99 %.  Intake/Output Summary (Last 24 hours) at 11/25/15 1220 Last data filed at 11/25/15 0910  Gross per 24 hour  Intake   1306 ml  Output      0 ml  Net   1306 ml   Filed Weights   11/21/15 2316 11/22/15 1128  11/25/15 0800  Weight: 52.164 kg (115 lb) 52.164 kg (115 lb) 52.164 kg (115 lb)    Exam Awake Alert, Oriented *3, No new F.N deficits, Normal affect [place].AT,PERRAL Supple Neck,No JVD, No cervical lymphadenopathy appriciated.  Symmetrical Chest wall movement, Good air movement bilaterally, CTAB RRR,No Gallops,Rubs or new Murmurs, No Parasternal Heave +ve B.Sounds, Abd Soft, Non tender, No organomegaly appriciated, No rebound -guarding or rigidity. No Cyanosis, Clubbing or edema, No new Rash or bruise  DISCHARGE CONDITION: Stable  DISPOSITION: Home  DISCHARGE INSTRUCTIONS:    Activity:  As tolerated   Get Medicines reviewed and adjusted: Please take all your medications with you for your next visit with your Primary MD  Please request your Primary MD to go over all hospital tests and procedure/radiological results at the follow up, please ask your Primary MD to get all Hospital  records sent to his/her office.  If you experience worsening of your admission symptoms, develop shortness of breath, life threatening emergency, suicidal or homicidal thoughts you must seek medical attention immediately by calling 911 or calling your MD immediately  if symptoms less severe.  You must read complete instructions/literature along with all the possible adverse reactions/side effects for all the Medicines you take and that have been prescribed to you. Take any new Medicines after you have completely understood and accpet all the possible adverse reactions/side effects.   Do not drive when taking Pain medications.   Do not take more than prescribed Pain, Sleep and Anxiety Medications  Special Instructions: If you have smoked or chewed Tobacco  in the last 2 yrs please stop smoking, stop any regular Alcohol  and or any Recreational drug use.  Wear Seat belts while driving.  Please note  You were cared for by a hospitalist during your hospital stay. Once you are discharged, your primary care physician will handle any further medical issues. Please note that NO REFILLS for any discharge medications will be authorized once you are discharged, as it is imperative that you return to your primary care physician (or establish a relationship with a primary care physician if you do not have one) for your aftercare needs so that they can reassess your need for medications and monitor your lab values.   Diet recommendation: Regular Diet  Discharge Instructions    Call MD for:  severe uncontrolled pain    Complete by:  As directed      Diet general    Complete by:  As directed      Increase activity slowly    Complete by:  As directed            Follow-up Information    Follow up with Molli Hazard, MD On 12/02/2015.   Specialties:  Hematology and Oncology, Oncology   Why:  appointment at 3 pm-go to the 4th floor   Contact information:   [place]  [id number] [phone]       Total Time spent on discharge equals  45 minutes.  SignedOren Binet 11/25/2015 12:20 PM

## 2015-11-25 NOTE — H&P (View-Only) (Signed)
[facility] Referring Provider: Dr. Hildred Laser Primary Care Physician:  PROVIDER NOT [place]  Reason for Consultation: Pancreatic mass   HPI:  Nicholas [name] is a 65 y.o. male whom I heard about 10 days ago when I spoke with Dr. Laural Golden. He was admitted at [facility] with severe abd pains, found to have a large mass in body/tail of pancreas.  We discussed out patient EUS. That was scheduled for last week however the patient called the morning of the procedure, unable to get to [facility] because he had no ride.  We were working on getting this rescheduled as an outpatient but he was readmitted for pain control three days ago to [facility].  He was transferred to [facility] shortly after admission.  He's had 30 pound weight loss, abd pain that radiates to back.  Poor appetite. + chronic etoh use but doesn't sound to be abusive.  Never had pancreas problems in the past and no FH of pancreatic disease that he is aware of.  I reviewed imaging reports and images, labs personally  LFTs normal CBC normal CA 19-9 1560   Past Medical History  Diagnosis Date  . Cancer (HCC)     Skin cancer-ear, skin cancer -leeg,cancer of leg muscle- left.  . TB (pulmonary tuberculosis)     3 yrs ago-tx" positive TB test aslso" was told he is not contagious.  . Abdominal pain in male     "was told he has a Mass near pancreas"    Past Surgical History  Procedure Laterality Date  . Leg surgery Left     " cancer removed from muscle"  . Colonoscopy w/ polypectomy      Prior to Admission medications   Medication Sig Start Date End Date Taking? Authorizing Provider  aspirin EC 81 MG tablet Take 81 mg by mouth daily.   Yes Historical Provider, MD  hydrocerin (EUCERIN) CREA Apply 1 application topically 2 (two) times daily as needed (For dry skin.).   Yes Historical Provider, MD  HYDROcodone-acetaminophen (NORCO/VICODIN) 5-325 MG tablet Take 1-2 tablets by mouth every 6 (six) hours as needed for moderate  pain. 11/13/15  Yes Samuella Cota, MD  Multiple Vitamins-Minerals (MULTIVITAMINS THER. W/MINERALS) TABS tablet Take 1 tablet by mouth daily.   Yes Historical Provider, MD    Current Facility-Administered Medications  Medication Dose Route Frequency Provider Last Rate Last Dose  . acetaminophen (TYLENOL) tablet 650 mg  650 mg Oral Q6H PRN Shanker Kristeen Mans, MD      . albuterol (PROVENTIL) (2.5 MG/3ML) 0.083% nebulizer solution 2.5 mg  2.5 mg Nebulization Q2H PRN Shanker Kristeen Mans, MD      . enoxaparin (LOVENOX) injection 40 mg  40 mg Subcutaneous Q24H Jonetta Osgood, MD   40 mg at 11/23/15 1032  . feeding supplement (ENSURE ENLIVE) (ENSURE ENLIVE) liquid 237 mL  237 mL Oral BID BM Nita Sells, MD      . ondansetron (ZOFRAN) tablet 4 mg  4 mg Oral Q6H PRN Phillips Grout, MD       Or  . ondansetron (ZOFRAN) injection 4 mg  4 mg Intravenous Q6H PRN Phillips Grout, MD      . oxyCODONE (Oxy IR/ROXICODONE) immediate release tablet 10 mg  10 mg Oral Q4H PRN Jonetta Osgood, MD   10 mg at 11/23/15 2049  . polyethylene glycol (MIRALAX / GLYCOLAX) packet 17 g  17 g Oral Daily Shanker Kristeen Mans, MD   17 g at  11/23/15 0924    Allergies as of 11/21/2015  . (No Known Allergies)    Family History  Problem Relation Age of Onset  . Pneumonia Father   . Stroke Mother     Social History   Social History  . Marital Status: Single    Spouse Name: N/A  . Number of Children: N/A  . Years of Education: N/A   Occupational History  . Not on file.   Social History Main Topics  . Smoking status: Current Every Day Smoker -- 0.50 packs/day for 30 years    Types: Cigarettes  . Smokeless tobacco: Not on file  . Alcohol Use: 0.0 oz/week    0 Standard drinks or equivalent per week     Comment: beer at night, wine   . Drug Use: No  . Sexual Activity: No   Other Topics Concern  . Not on file   Social History Narrative     Review of Systems: Pertinent positive and negative review of  systems were noted in the above HPI section. Complete review of systems was performed and was otherwise normal.   Physical Exam: Vital signs in last 24 hours: Temp:  [97.4 F (36.3 C)-98.2 F (36.8 C)] 97.4 F (36.3 C) (05/22 0456) Pulse Rate:  [62-68] 66 (05/22 0456) Resp:  [16] 16 (05/22 0456) BP: (147-161)/(82-97) 156/85 mmHg (05/22 0456) SpO2:  [98 %-100 %] 98 % (05/22 0456) Last BM Date: 11/21/15 Constitutional: generally well-appearing Psychiatric: alert and oriented x3 Eyes: extraocular movements intact Mouth: oral pharynx moist, no lesions Neck: supple no lymphadenopathy Cardiovascular: heart regular rate and rhythm Lungs: clear to auscultation bilaterally Abdomen: soft, nontender, nondistended, no obvious ascites, no peritoneal signs, normal bowel sounds Extremities: no lower extremity edema bilaterally Skin: no lesions on visible extremities   Lab Results:  Recent Labs  11/22/15 0003  WBC 7.6  HGB 12.5*  HCT 35.4*  PLT 240  MCV 96.2   BMET  Recent Labs  11/22/15 0003 11/23/15 0740 11/24/15 0401  NA 125* 131* 131*  K 3.5 4.0 3.9  CL 92* 99* 98*  CO2 25 28 27   GLUCOSE 121* 114* 122*  BUN 7 <5* 7  CREATININE 0.39* 0.44* 0.63  CALCIUM 8.6* 8.9 8.7*   LFT  Recent Labs  11/22/15 0003  BILITOT 0.3  AST 18  ALT 10*  ALKPHOS 61  PROT 7.1  ALBUMIN 3.6    Impression/Plan: 65 y.o. male with large mass in body of pancreas that clearly abuts celiac trunk, SMA, portal vein at level of the confluenc  Likely advanced pancreatic cancer, inoperable due to major blood vessel involvement. CA 19-9 very elevated, this raises suspicion that it is metastatic as well, ?diffuse intraperitoneal involvement.  Will plan on upper EUS tomorrow AM.  I've ordered NPO after MN and d/c of lovenox for now. He will likely be OK for d/c after that as long as he has oral pain control (?+ patch) and appointments set to meet oncology closer to home.    Milus Banister, MD   11/24/2015, 9:13 AM [facility] Pager [phone]

## 2015-11-25 NOTE — Transfer of Care (Signed)
Immediate Anesthesia Transfer of Care Note  Patient: Nicholas [name]  Procedure(s) Performed: Procedure(s): ESOPHAGEAL ENDOSCOPIC ULTRASOUND (EUS) RADIAL (N/A)  Patient Location: PACU  Anesthesia Type:MAC  Level of Consciousness: awake, alert  and oriented  Airway & Oxygen Therapy: Patient Spontanous Breathing and Patient connected to nasal cannula oxygen  Post-op Assessment: Report given to RN and Post -op Vital signs reviewed and stable  Post vital signs: Reviewed and stable  Last Vitals:  Filed Vitals:   11/25/15 0522 11/25/15 0800  BP: 130/77 177/84  Pulse: 73 76  Temp: 36.6 C 36.7 C  Resp: 16 17    Last Pain:  Filed Vitals:   11/25/15 0804  PainSc: 5       Patients Stated Pain Goal: 0 ([id number])  Complications: No apparent anesthesia complications

## 2015-11-25 NOTE — Op Note (Signed)
[facility] Patient Name: Nicholas [name] Procedure Date: 11/25/2015 MRN: [id number] Attending MD: Milus Banister , MD Date of Birth: June 17, 1951 CSN: [id number] Age: 65 Admit Type: Inpatient Procedure:                Upper EUS Indications:              Suspected mass in pancreas on CT scan, CA 19-9 1500 Providers:                Milus Banister, MD, Tory Emerald, RN, Vista Lawman, RN, William Dalton, Technician Referring MD:             Hildred Laser, MD Medicines:                Monitored Anesthesia Care Complications:            No immediate complications. Estimated blood loss:                            None. Estimated Blood Loss:     Estimated blood loss: none. Procedure:                Pre-Anesthesia Assessment:                           - Prior to the procedure, a History and Physical                            was performed, and patient medications and                            allergies were reviewed. The patient's tolerance of                            previous anesthesia was also reviewed. The risks                            and benefits of the procedure and the sedation                            options and risks were discussed with the patient.                            All questions were answered, and informed consent                            was obtained. Prior Anticoagulants: The patient has                            taken no previous anticoagulant or antiplatelet                            agents. ASA Grade Assessment: II - A patient with  mild systemic disease. After reviewing the risks                            and benefits, the patient was deemed in                            satisfactory condition to undergo the procedure.                           After obtaining informed consent, the endoscope was                            passed under direct vision. Throughout the                             procedure, the patient's blood pressure, pulse, and                            oxygen saturations were monitored continuously. The                            [id number]) scope was introduced through                            the mouth, and advanced to the second part of                            duodenum. The upper EUS was accomplished without                            difficulty. The patient tolerated the procedure                            well. Scope In: Scope Out: Findings:      Endoscopic Finding :      The examined esophagus was endoscopically normal.      The entire examined stomach was endoscopically normal.      The examined duodenum was endoscopically normal.      Endosonographic Finding :      1. An irregular mass was identified in the pancreatic body/tail. The       mass was hypoechoic. The mass measured 4.2cm in maximal cross-sectional       diameter, but this is likely an underestimate of it's actual size       compared to CT findings. The mass clearly involves major blood vessels       on CT scan as well (SMV, celiac trunk, SMA) however that was not       appreciated on this exam due to the large size of the mass. The       endosonographic borders were poorly-defined. Fine needle aspiration for       cytology was performed. Color Doppler imaging was utilized prior to       needle puncture to confirm a lack of significant vascular structures       within the needle path. Three passes were made with the 22 gauge needle  using a transgastric approach. No stylet was used. A cytotechnologist       was present to evaluate the adequacy of the specimen. 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      2. No peripancreatic adenopathy.      3. Limited views of liver, spleen were all normal. 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                            Preliminary cytology + for malignany,                            adenocarcinoma. Moderate Sedation:      N/A- Per Anesthesia Care Recommendation:           - Return patient to hospital ward for possible                            discharge same day. He will need adequate narcotic                            prescription for pain control.                           - Medical oncology appointment needs to be arranged                            for next 1-2 weeks, he prefers closer to home,                            [facility]. I will contact him with final cytology                            results.                           - Advance diet as tolerated. Procedure Code(s):        --- Professional ---                           [phone], Esophagogastroduodenoscopy, flexible,                            transoral; with transendoscopic ultrasound-guided  intramural or transmural fine needle                            aspiration/biopsy(s), (includes endoscopic                            ultrasound examination limited to the esophagus,                            stomach or duodenum, and adjacent structures) Diagnosis Code(s):        --- Professional ---                           K86.89, Other specified diseases of pancreas                           R93.3, Abnormal findings on diagnostic imaging of                            other parts of digestive tract CPT copyright 2016 American Medical Association. All rights reserved. The codes documented in this report are preliminary and upon coder review may  be revised to meet current compliance requirements. Milus Banister, MD 11/25/2015 9:26:18 AM This report has been signed electronically. Number of Addenda: 0

## 2015-11-25 NOTE — Interval H&P Note (Signed)
History and Physical Interval Note:  11/25/2015 8:09 AM  Nicholas [name]  has presented today for surgery, with the diagnosis of pancreatic mass  The various methods of treatment have been discussed with the patient and family. After consideration of risks, benefits and other options for treatment, the patient has consented to  Procedure(s): ESOPHAGEAL ENDOSCOPIC ULTRASOUND (EUS) RADIAL (N/A) as a surgical intervention .  The patient's history has been reviewed, patient examined, no change in status, stable for surgery.  I have reviewed the patient's chart and labs.  Questions were answered to the patient's satisfaction.     Milus Banister

## 2015-11-26 ENCOUNTER — Encounter (HOSPITAL_COMMUNITY): Payer: Self-pay | Admitting: Gastroenterology

## 2015-11-27 ENCOUNTER — Encounter (HOSPITAL_COMMUNITY): Payer: Self-pay | Admitting: *Deleted

## 2015-12-02 ENCOUNTER — Encounter (HOSPITAL_COMMUNITY): Payer: Self-pay | Admitting: Hematology & Oncology

## 2015-12-02 ENCOUNTER — Encounter (HOSPITAL_COMMUNITY): Payer: Medicaid Other | Attending: Hematology & Oncology | Admitting: Hematology & Oncology

## 2015-12-02 ENCOUNTER — Other Ambulatory Visit (HOSPITAL_COMMUNITY): Payer: Self-pay | Admitting: Oncology

## 2015-12-02 VITALS — BP 136/92 | HR 113 | Temp 98.8°F | Resp 18 | Ht 70.0 in | Wt 118.1 lb

## 2015-12-02 DIAGNOSIS — E871 Hypo-osmolality and hyponatremia: Secondary | ICD-10-CM

## 2015-12-02 DIAGNOSIS — C258 Malignant neoplasm of overlapping sites of pancreas: Secondary | ICD-10-CM

## 2015-12-02 DIAGNOSIS — Z7982 Long term (current) use of aspirin: Secondary | ICD-10-CM | POA: Insufficient documentation

## 2015-12-02 DIAGNOSIS — Z85828 Personal history of other malignant neoplasm of skin: Secondary | ICD-10-CM | POA: Insufficient documentation

## 2015-12-02 DIAGNOSIS — Z8611 Personal history of tuberculosis: Secondary | ICD-10-CM

## 2015-12-02 DIAGNOSIS — K7689 Other specified diseases of liver: Secondary | ICD-10-CM | POA: Diagnosis not present

## 2015-12-02 DIAGNOSIS — F17210 Nicotine dependence, cigarettes, uncomplicated: Secondary | ICD-10-CM | POA: Insufficient documentation

## 2015-12-02 DIAGNOSIS — G893 Neoplasm related pain (acute) (chronic): Secondary | ICD-10-CM | POA: Diagnosis not present

## 2015-12-02 DIAGNOSIS — K8689 Other specified diseases of pancreas: Secondary | ICD-10-CM | POA: Insufficient documentation

## 2015-12-02 DIAGNOSIS — K769 Liver disease, unspecified: Secondary | ICD-10-CM

## 2015-12-02 DIAGNOSIS — C251 Malignant neoplasm of body of pancreas: Secondary | ICD-10-CM

## 2015-12-02 DIAGNOSIS — R109 Unspecified abdominal pain: Secondary | ICD-10-CM | POA: Diagnosis present

## 2015-12-02 DIAGNOSIS — C259 Malignant neoplasm of pancreas, unspecified: Secondary | ICD-10-CM

## 2015-12-02 HISTORY — DX: Malignant neoplasm of pancreas, unspecified: C25.9

## 2015-12-02 MED ORDER — OXYCODONE HCL ER 15 MG PO T12A: 15.0000 mg | EXTENDED_RELEASE_TABLET | Freq: Two times a day (BID) | ORAL | Status: DC

## 2015-12-02 MED ORDER — ONDANSETRON HCL 8 MG PO TABS: 8.0000 mg | ORAL_TABLET | Freq: Three times a day (TID) | ORAL | Status: DC | PRN

## 2015-12-02 MED ORDER — PROCHLORPERAZINE MALEATE 10 MG PO TABS: 10.0000 mg | ORAL_TABLET | Freq: Four times a day (QID) | ORAL | Status: DC | PRN

## 2015-12-02 MED ORDER — LIDOCAINE-PRILOCAINE 2.5-2.5 % EX CREA: TOPICAL_CREAM | CUTANEOUS | Status: AC

## 2015-12-02 MED ORDER — OXYCODONE HCL 10 MG PO TABS: 10.0000 mg | ORAL_TABLET | Freq: Four times a day (QID) | ORAL | Status: DC | PRN

## 2015-12-02 NOTE — Patient Instructions (Addendum)
[place] at [facility] Discharge Instructions  RECOMMENDATIONS MADE BY THE CONSULTANT AND ANY TEST RESULTS WILL BE SENT TO YOUR REFERRING PHYSICIAN.   We are referring you to [facility] for a port a cath. You will have a port placed on Monday June 5 @ [facility].  Your treatment will consist of Gemzar/Abraxane.  You will need to take something to keep you from getting constipated while taking this pain medication. I will give you a Constipation Sheet to go by.   2 pain prescriptions given to you today: Oxycodone/Oxycontin long acting  Please call Hildred Alamin for any questions: [phone] - the number I am providing you is the scheduler's number, Amy. Please call and have her get me or let her know what is going on with you so she can let me know.  Please come tomorrow to chemo teaching from 05-1229. Arrive to the Information Desk at the Main Entrance of [facility] and ask to be directed to the [place] Room. You will be taught about chemotherapy there.   You will be provided with more information related to your treatment tomorrow.  We are going to start chemo next week on Tuesday 12/09/15.   3 medications have been called into your pharmacy. 2 medications are for nausea. You will be given a nausea sheet tomorrow so you can refer to it. We will also teach you here how to take your nausea medications. We also called in a numbing cream for your port. The first time we give you chemo we will ice your port to numb it and after that you will apply the numbing cream to your port. This is to decrease pain associated with the needle stick.        Thank you for choosing [place] at [facility] to provide your oncology and hematology care.  To afford each patient quality time with our provider, please arrive at least 15 minutes before your scheduled appointment time.   Beginning January 23rd 2017 lab work  for the [facility] will be done in the  Main lab at [facility] on 1st floor. If you have a lab appointment with the [facility] [place] [facility] please come in thru the  Main Entrance and check in at the main information desk  You need to re-schedule your appointment should you arrive 10 or more minutes late.  We strive to give you quality time with our providers, and arriving late affects you and other patients whose appointments are after yours.  Also, if you no show three or more times for appointments you may be dismissed from the clinic at the providers discretion.     Again, thank you for choosing [facility].  Our hope is that these requests will decrease the amount of time that you wait before being seen by our physicians.       _____________________________________________________________  Should you have questions after your visit to [facility], please contact our office at [phone] between the hours of 8:30 a.m. and 4:30 p.m.  Voicemails left after 4:30 p.m. will not be returned until the following business day.  For prescription refill requests, have your pharmacy contact our office.         Resources For Cancer Patients and their Caregivers ? American Cancer Society: Can assist with transportation, wigs, general needs, runs Look Good Feel Better.        315-669-7953 ? Cancer Care:  Provides financial assistance, online support groups, medication/co-pay assistance.  1-800-813-HOPE (531)184-5555) ? [place] Assists [place] cancer patients and their families through emotional , educational and financial support.  (819)484-1915 ? [place] DSS Where to apply for food stamps, Medicaid and utility assistance. (325) 495-9851 ? [facility]: Transportation to medical appointments. 8604299285 ? Social Security Administration: May apply for disability if have a Stage IV cancer. 902-816-8045 737-674-4014 ? [employer], Disability  and Transit Services: Assists with nutrition, care and transit needs. [place] Support Programs: @10RELATIVEDAYS @ > Cancer Support Group  2nd Tuesday of the month 1pm-2pm, Journey Room  > Creative Journey  3rd Tuesday of the month 1130am-1pm, Journey Room  > Look Good Feel Better  1st Wednesday of the month 10am-12 noon, Journey Room (Call [place] to register [phone])

## 2015-12-02 NOTE — Patient Instructions (Addendum)
Nicholas [name]   CHEMOTHERAPY INSTRUCTIONS  Your chemo treatment plan will consist of Gemcitabine (Gemzar) and Abraxane for the treatment of your pancreatic cancer.   You will come on Days 1, 8, 15 every 28 days to receive these drugs. Do not be surprised if you can only tolerate Days 1 and 8. Many people can not tolerate three weeks in a row and can only tolerate 2 weeks in a row. Don't get upset about this. We will adjust your treatment plan for you.    Premeds: Zofran - for nausea/vomiting prevention/reduction. We will give this to you through your port a cath before each chemo infusion.  Chemo Medications:  Gemcitabine - bone marrow suppression (lowers white blood cells (fight infection), lowers red blood cells (make up your blood), lowers platelets (help blood to clot). Nausea/vomiting,fever, flu-like symptoms, rash. (takes 30 minutes to infuse)  Abraxane - myelosuppression (bone marrow suppression - lowers white blood cells, red blood cells, and platelets), sensory neuropathy, muscle and joint aches/pain, hair loss, nausea/vomiting, diarrhea, mucositis-inflammation of the lining of your mucosal membrane such as mouth, throat, stomach -- this can cause burning, tenderness, pain, mouth sores   (takes 30 minutes to infuse)   POTENTIAL SIDE EFFECTS OF TREATMENT: Increased Susceptibility to Infection, Vomiting, Hair Thinning, Changes in Character of Skin and Nails (brittleness, dryness,etc.), Pigment Changes (darkening of nail beds, palms of hands, soles of feet, etc.), Bone Marrow Suppression, Complete Hair Loss, Nausea, Diarrhea, Sun Sensitivity and Mouth Sores    EDUCATIONAL MATERIALS GIVEN AND REVIEWED: Chemotherapy and You  Specific Instructions Sheets: Gemcitabine, Xeloda, Zofran, Compazine, EMLA cream   SELF CARE ACTIVITIES WHILE ON CHEMOTHERAPY: Increase your fluid intake 48 hours prior to treatment and drink at least 2 quarts per day after  treatment., No alcohol intake., No aspirin or other medications unless approved by your oncologist., Eat foods that are light and easy to digest., Eat foods at cold or room temperature., No fried, fatty, or spicy foods immediately before or after treatment., Have teeth cleaned professionally before starting treatment. Keep dentures and partial plates clean., Use soft toothbrush and do not use mouthwashes that contain alcohol. Biotene is a good mouthwash that is available at most pharmacies or may be ordered by calling [phone]., Use warm salt water gargles (1 teaspoon salt per 1 quart warm water) before and after meals and at bedtime. Or you may rinse with 2 tablespoons of three -percent hydrogen peroxide mixed in eight ounces of water., Always use sunscreen with SPF (Sun Protection Factor) of 50 or higher., Use your nausea medication as directed to prevent nausea., Use your stool softener or laxative as directed to prevent constipation. and Use your anti-diarrheal medication as directed to stop diarrhea.  Please wash your hands for at least 30 seconds using warm soapy water. Handwashing is the #1 way to prevent the spread of germs. Stay away from sick people or people who are getting over a cold. If you develop respiratory systems such as green/yellow mucus production or productive cough or persistent cough let us know and we will see if you need an antibiotic. It is a good idea to keep a pair of gloves on when going into grocery stores/[employer] to decrease your risk of coming into contact with germs on the carts, etc. Carry alcohol hand gel with you at all times and use it frequently if out in public. All foods need to be cooked thoroughly. No raw foods. No medium or undercooked meats,  eggs. If your food is cooked medium well, it does not need to be hot pink or saturated with bloody liquid at all. Vegetables and fruits need to be washed/rinsed under the faucet with a dish detergent before being consumed.  You can eat raw fruits and vegetables unless we tell you otherwise but it would be best if you cooked them or bought frozen. Do not eat off of salad bars or hot bars unless you really trust the cleanliness of the restaurant. If you need dental work, please let Dr. Whitney Muse know before you go for your appointment so that we can coordinate the best possible time for you in regards to your chemo regimen. You need to also let your dentist know that you are actively taking chemo. We may need to do labs prior to your dental appointment. We also want your bowels moving at least every other day. If this is not happening, we need to know so that we can get you on a bowel regimen to help you go. If you are going to have sex, you must wear a condom. This is to protect your partner from potential chemotherapy exposure. Do not be surprised if you can't get or maintain an erection. Chemotherapy can decrease your libido (sex drive). Please talk to  Dr. Whitney Muse if you have any questions or concerns about this.    MEDICATIONS: You have been given prescriptions for the following medications:  Zofran/Ondansetron 8mg  tablet. Take 1 tablet every 8 hours as needed for nausea/vomiting. (#1 nausea med to take, this can constipate)  Compazine/Prochlorperazine 10mg  tablet. Take 1 tablet every 6 hours as needed for nausea/vomiting. (#2 nausea med to take, this can make you sleepy)   EMLA cream. Apply a quarter size amount to port site 1 hour prior to chemo. Do not rub in. Cover with plastic wrap.   Over-the-Counter Meds:  Miralax 1 capful in 8 oz of fluid daily. May increase to two times a day if needed. This is a stool softener. If this doesn't work proceed you can add:  Senokot S  - start with 1 tablet two times a day and increase to 4 tablets two times a day if needed. (total of 8 tablets in a 24 hour period). This is a stimulant laxative.   Call us if this does not help your bowels move.   Imodium 2mg  capsule. Take 2  capsules after the 1st loose stool and then 1 capsule every 2 hours until you go a total of 12 hours without having a loose stool. Call the [facility] if loose stools continue. If diarrhea occurs @ bedtime, take 2 capsules @ bedtime. Then take 2 capsules every 4 hours until morning. Call [facility].   SYMPTOMS TO REPORT AS SOON AS POSSIBLE AFTER TREATMENT:  FEVER GREATER THAN 100.5 F  CHILLS WITH OR WITHOUT FEVER  NAUSEA AND VOMITING THAT IS NOT CONTROLLED WITH YOUR NAUSEA MEDICATION  UNUSUAL SHORTNESS OF BREATH  UNUSUAL BRUISING OR BLEEDING  TENDERNESS IN MOUTH AND THROAT WITH OR WITHOUT PRESENCE OF ULCERS  URINARY PROBLEMS  BOWEL PROBLEMS  UNUSUAL RASH    Wear comfortable clothing and clothing appropriate for easy access to any Portacath or PICC line. Let us know if there is anything that we can do to make your therapy better!      I have been informed and understand all of the instructions given to me and have received a copy. I have been instructed to call the clinic [phone] or  my family physician as soon as possible for continued medical care, if indicated. I do not have any more questions at this time but understand that I may call the [facility] or the Patient Navigator at [phone] during office hours should I have questions or need assistance in obtaining follow-up care.          Gemcitabine injection What is this medicine? GEMCITABINE (jem SIT a been) is a chemotherapy drug. This medicine is used to treat many types of cancer like breast cancer, lung cancer, pancreatic cancer, and ovarian cancer. This medicine may be used for other purposes; ask your health care provider or pharmacist if you have questions. What should I tell my health care provider before I take this medicine? They need to know if you have any of these conditions: -blood disorders -infection -kidney disease -liver disease -recent or ongoing radiation therapy -an  unusual or allergic reaction to gemcitabine, other chemotherapy, other medicines, foods, dyes, or preservatives -pregnant or trying to get pregnant -breast-feeding How should I use this medicine? This drug is given as an infusion into a vein. It is administered in a hospital or clinic by a specially trained health care professional. Talk to your pediatrician regarding the use of this medicine in children. Special care may be needed. Overdosage: If you think you have taken too much of this medicine contact a poison control center or emergency room at once. NOTE: This medicine is only for you. Do not share this medicine with others. What if I miss a dose? It is important not to miss your dose. Call your doctor or health care professional if you are unable to keep an appointment. What may interact with this medicine? -medicines to increase blood counts like filgrastim, pegfilgrastim, sargramostim -some other chemotherapy drugs like cisplatin -vaccines Talk to your doctor or health care professional before taking any of these medicines: -acetaminophen -aspirin -ibuprofen -ketoprofen -naproxen This list may not describe all possible interactions. Give your health care provider a list of all the medicines, herbs, non-prescription drugs, or dietary supplements you use. Also tell them if you smoke, drink alcohol, or use illegal drugs. Some items may interact with your medicine. What should I watch for while using this medicine? Visit your doctor for checks on your progress. This drug may make you feel generally unwell. This is not uncommon, as chemotherapy can affect healthy cells as well as cancer cells. Report any side effects. Continue your course of treatment even though you feel ill unless your doctor tells you to stop. In some cases, you may be given additional medicines to help with side effects. Follow all directions for their use. Call your doctor or health care professional for advice if  you get a fever, chills or sore throat, or other symptoms of a cold or flu. Do not treat yourself. This drug decreases your body's ability to fight infections. Try to avoid being around people who are sick. This medicine may increase your risk to bruise or bleed. Call your doctor or health care professional if you notice any unusual bleeding. Be careful brushing and flossing your teeth or using a toothpick because you may get an infection or bleed more easily. If you have any dental work done, tell your dentist you are receiving this medicine. Avoid taking products that contain aspirin, acetaminophen, ibuprofen, naproxen, or ketoprofen unless instructed by your doctor. These medicines may hide a fever. Women should inform their doctor if they wish to become pregnant or think they  might be pregnant. There is a potential for serious side effects to an unborn child. Talk to your health care professional or pharmacist for more information. Do not breast-feed an infant while taking this medicine. What side effects may I notice from receiving this medicine? Side effects that you should report to your doctor or health care professional as soon as possible: -allergic reactions like skin rash, itching or hives, swelling of the face, lips, or tongue -low blood counts - this medicine may decrease the number of white blood cells, red blood cells and platelets. You may be at increased risk for infections and bleeding. -signs of infection - fever or chills, cough, sore throat, pain or difficulty passing urine -signs of decreased platelets or bleeding - bruising, pinpoint red spots on the skin, black, tarry stools, blood in the urine -signs of decreased red blood cells - unusually weak or tired, fainting spells, lightheadedness -breathing problems -chest pain -mouth sores -nausea and vomiting -pain, swelling, redness at site where injected -pain, tingling, numbness in the hands or feet -stomach pain -swelling of  ankles, feet, hands -unusual bleeding Side effects that usually do not require medical attention (report to your doctor or health care professional if they continue or are bothersome): -constipation -diarrhea -hair loss -loss of appetite -stomach upset This list may not describe all possible side effects. Call your doctor for medical advice about side effects. You may report side effects to FDA at 1-800-FDA-1088. Where should I keep my medicine? This drug is given in a hospital or clinic and will not be stored at home. NOTE: This sheet is a summary. It may not cover all possible information. If you have questions about this medicine, talk to your doctor, pharmacist, or health care provider.    2016, Elsevier/Gold Standard. (2007-10-31 18:45:54) Nanoparticle Albumin-Bound Paclitaxel injection What is this medicine? NANOPARTICLE ALBUMIN-BOUND PACLITAXEL (Na no PAHR ti kuhl al BYOO muhn-bound PAK li TAX el) is a chemotherapy drug. It targets fast dividing cells, like cancer cells, and causes these cells to die. This medicine is used to treat advanced breast cancer and advanced lung cancer. This medicine may be used for other purposes; ask your health care provider or pharmacist if you have questions. What should I tell my health care provider before I take this medicine? They need to know if you have any of these conditions: -kidney disease -liver disease -low blood counts, like low platelets, red blood cells, or white blood cells -recent or ongoing radiation therapy -an unusual or allergic reaction to paclitaxel, albumin, other chemotherapy, other medicines, foods, dyes, or preservatives -pregnant or trying to get pregnant -breast-feeding How should I use this medicine? This drug is given as an infusion into a vein. It is administered in a hospital or clinic by a specially trained health care professional. Talk to your pediatrician regarding the use of this medicine in children. Special  care may be needed. Overdosage: If you think you have taken too much of this medicine contact a poison control center or emergency room at once. NOTE: This medicine is only for you. Do not share this medicine with others. What if I miss a dose? It is important not to miss your dose. Call your doctor or health care professional if you are unable to keep an appointment. What may interact with this medicine? -cyclosporine -diazepam -ketoconazole -medicines to increase blood counts like filgrastim, pegfilgrastim, sargramostim -other chemotherapy drugs like cisplatin, doxorubicin, epirubicin, etoposide, teniposide, vincristine -quinidine -testosterone -vaccines -verapamil Talk to your doctor  or health care professional before taking any of these medicines: -acetaminophen -aspirin -ibuprofen -ketoprofen -naproxen This list may not describe all possible interactions. Give your health care provider a list of all the medicines, herbs, non-prescription drugs, or dietary supplements you use. Also tell them if you smoke, drink alcohol, or use illegal drugs. Some items may interact with your medicine. What should I watch for while using this medicine? Your condition will be monitored carefully while you are receiving this medicine. You will need important blood work done while you are taking this medicine. This drug may make you feel generally unwell. This is not uncommon, as chemotherapy can affect healthy cells as well as cancer cells. Report any side effects. Continue your course of treatment even though you feel ill unless your doctor tells you to stop. In some cases, you may be given additional medicines to help with side effects. Follow all directions for their use. Call your doctor or health care professional for advice if you get a fever, chills or sore throat, or other symptoms of a cold or flu. Do not treat yourself. This drug decreases your body's ability to fight infections. Try to avoid  being around people who are sick. This medicine may increase your risk to bruise or bleed. Call your doctor or health care professional if you notice any unusual bleeding. Be careful brushing and flossing your teeth or using a toothpick because you may get an infection or bleed more easily. If you have any dental work done, tell your dentist you are receiving this medicine. Avoid taking products that contain aspirin, acetaminophen, ibuprofen, naproxen, or ketoprofen unless instructed by your doctor. These medicines may hide a fever. Do not become pregnant while taking this medicine. Women should inform their doctor if they wish to become pregnant or think they might be pregnant. There is a potential for serious side effects to an unborn child. Talk to your health care professional or pharmacist for more information. Do not breast-feed an infant while taking this medicine. Men are advised not to father a child while receiving this medicine. What side effects may I notice from receiving this medicine? Side effects that you should report to your doctor or health care professional as soon as possible: -allergic reactions like skin rash, itching or hives, swelling of the face, lips, or tongue -low blood counts - This drug may decrease the number of white blood cells, red blood cells and platelets. You may be at increased risk for infections and bleeding. -signs of infection - fever or chills, cough, sore throat, pain or difficulty passing urine -signs of decreased platelets or bleeding - bruising, pinpoint red spots on the skin, black, tarry stools, nosebleeds -signs of decreased red blood cells - unusually weak or tired, fainting spells, lightheadedness -breathing problems -changes in vision -chest pain -high or low blood pressure -mouth sores -nausea and vomiting -pain, swelling, redness or irritation at the injection site -pain, tingling, numbness in the hands or feet -slow or irregular  heartbeat -swelling of the ankle, feet, hands Side effects that usually do not require medical attention (report to your doctor or health care professional if they continue or are bothersome): -aches, pains -changes in the color of fingernails -diarrhea -hair loss -loss of appetite This list may not describe all possible side effects. Call your doctor for medical advice about side effects. You may report side effects to FDA at 1-800-FDA-1088. Where should I keep my medicine? This drug is given in a hospital or clinic  and will not be stored at home. NOTE: This sheet is a summary. It may not cover all possible information. If you have questions about this medicine, talk to your doctor, pharmacist, or health care provider.    2016, Elsevier/Gold Standard. (2012-08-14 16:48:50) Ondansetron tablets What is this medicine? ONDANSETRON (on DAN se tron) is used to treat nausea and vomiting caused by chemotherapy. It is also used to prevent or treat nausea and vomiting after surgery. This medicine may be used for other purposes; ask your health care provider or pharmacist if you have questions. What should I tell my health care provider before I take this medicine? They need to know if you have any of these conditions: -heart disease -history of irregular heartbeat -liver disease -low levels of magnesium or potassium in the blood -an unusual or allergic reaction to ondansetron, granisetron, other medicines, foods, dyes, or preservatives -pregnant or trying to get pregnant -breast-feeding How should I use this medicine? Take this medicine by mouth with a glass of water. Follow the directions on your prescription label. Take your doses at regular intervals. Do not take your medicine more often than directed. Talk to your pediatrician regarding the use of this medicine in children. Special care may be needed. Overdosage: If you think you have taken too much of this medicine contact a poison control  center or emergency room at once. NOTE: This medicine is only for you. Do not share this medicine with others. What if I miss a dose? If you miss a dose, take it as soon as you can. If it is almost time for your next dose, take only that dose. Do not take double or extra doses. What may interact with this medicine? Do not take this medicine with any of the following medications: -apomorphine -certain medicines for fungal infections like fluconazole, itraconazole, ketoconazole, posaconazole, voriconazole -cisapride -dofetilide -dronedarone -pimozide -thioridazine -ziprasidone This medicine may also interact with the following medications: -carbamazepine -certain medicines for depression, anxiety, or psychotic disturbances -fentanyl -linezolid -MAOIs like Carbex, Eldepryl, Marplan, Nardil, and Parnate -methylene blue (injected into a vein) -other medicines that prolong the QT interval (cause an abnormal heart rhythm) -phenytoin -rifampicin -tramadol This list may not describe all possible interactions. Give your health care provider a list of all the medicines, herbs, non-prescription drugs, or dietary supplements you use. Also tell them if you smoke, drink alcohol, or use illegal drugs. Some items may interact with your medicine. What should I watch for while using this medicine? Check with your doctor or health care professional right away if you have any sign of an allergic reaction. What side effects may I notice from receiving this medicine? Side effects that you should report to your doctor or health care professional as soon as possible: -allergic reactions like skin rash, itching or hives, swelling of the face, lips or tongue -breathing problems -confusion -dizziness -fast or irregular heartbeat -feeling faint or lightheaded, falls -fever and chills -loss of balance or coordination -seizures -sweating -swelling of the hands or feet -tightness in the  chest -tremors -unusually weak or tired Side effects that usually do not require medical attention (report to your doctor or health care professional if they continue or are bothersome): -constipation or diarrhea -headache This list may not describe all possible side effects. Call your doctor for medical advice about side effects. You may report side effects to FDA at 1-800-FDA-1088. Where should I keep my medicine? Keep out of the reach of children. Store between 2 and 30  degrees C (36 and 86 degrees F). Throw away any unused medicine after the expiration date. NOTE: This sheet is a summary. It may not cover all possible information. If you have questions about this medicine, talk to your doctor, pharmacist, or health care provider.    2016, Elsevier/Gold Standard. (2013-03-28 16:27:45) Prochlorperazine tablets What is this medicine? PROCHLORPERAZINE (proe klor PER a zeen) helps to control severe nausea and vomiting. This medicine is also used to treat schizophrenia. It can also help patients who experience anxiety that is not due to psychological illness. This medicine may be used for other purposes; ask your health care provider or pharmacist if you have questions. What should I tell my health care provider before I take this medicine? They need to know if you have any of these conditions: -blood disorders or disease -dementia -liver disease or jaundice -Parkinson's disease -uncontrollable movement disorder -an unusual or allergic reaction to prochlorperazine, other medicines, foods, dyes, or preservatives -pregnant or trying to get pregnant -breast-feeding How should I use this medicine? Take this medicine by mouth with a glass of water. Follow the directions on the prescription label. Take your doses at regular intervals. Do not take your medicine more often than directed. Do not stop taking this medicine suddenly. This can cause nausea, vomiting, and dizziness. Ask your doctor or  health care professional for advice. Talk to your pediatrician regarding the use of this medicine in children. Special care may be needed. While this drug may be prescribed for children as young as 2 years for selected conditions, precautions do apply. Overdosage: If you think you have taken too much of this medicine contact a poison control center or emergency room at once. NOTE: This medicine is only for you. Do not share this medicine with others. What if I miss a dose? If you miss a dose, take it as soon as you can. If it is almost time for your next dose, take only that dose. Do not take double or extra doses. What may interact with this medicine? Do not take this medicine with any of the following medications: -amoxapine -antidepressants like citalopram, escitalopram, fluoxetine, paroxetine, and sertraline -deferoxamine -dofetilide -maprotiline -tricyclic antidepressants like amitriptyline, clomipramine, imipramine, nortiptyline and others This medicine may also interact with the following medications: -lithium -medicines for pain -phenytoin -propranolol -warfarin This list may not describe all possible interactions. Give your health care provider a list of all the medicines, herbs, non-prescription drugs, or dietary supplements you use. Also tell them if you smoke, drink alcohol, or use illegal drugs. Some items may interact with your medicine. What should I watch for while using this medicine? Visit your doctor or health care professional for regular checks on your progress. You may get drowsy or dizzy. Do not drive, use machinery, or do anything that needs mental alertness until you know how this medicine affects you. Do not stand or sit up quickly, especially if you are an older patient. This reduces the risk of dizzy or fainting spells. Alcohol may interfere with the effect of this medicine. Avoid alcoholic drinks. This medicine can reduce the response of your body to heat or cold.  Dress warm in cold weather and stay hydrated in hot weather. If possible, avoid extreme temperatures like saunas, hot tubs, very hot or cold showers, or activities that can cause dehydration such as vigorous exercise. This medicine can make you more sensitive to the sun. Keep out of the sun. If you cannot avoid being in the sun, wear  protective clothing and use sunscreen. Do not use sun lamps or tanning beds/booths. Your mouth may get dry. Chewing sugarless gum or sucking hard candy, and drinking plenty of water may help. Contact your doctor if the problem does not go away or is severe. What side effects may I notice from receiving this medicine? Side effects that you should report to your doctor or health care professional as soon as possible: -blurred vision -breast enlargement in men or women -breast milk in women who are not breast-feeding -chest pain, fast or irregular heartbeat -confusion, restlessness -dark yellow or brown urine -difficulty breathing or swallowing -dizziness or fainting spells -drooling, shaking, movement difficulty (shuffling walk) or rigidity -fever, chills, sore throat -involuntary or uncontrollable movements of the eyes, mouth, head, arms, and legs -seizures -stomach area pain -unusually weak or tired -unusual bleeding or bruising -yellowing of skin or eyes Side effects that usually do not require medical attention (report to your doctor or health care professional if they continue or are bothersome): -difficulty passing urine -difficulty sleeping -headache -sexual dysfunction -skin rash, or itching This list may not describe all possible side effects. Call your doctor for medical advice about side effects. You may report side effects to FDA at 1-800-FDA-1088. Where should I keep my medicine? Keep out of the reach of children. Store at room temperature between 15 and 30 degrees C (59 and 86 degrees F). Protect from light. Throw away any unused medicine after  the expiration date. NOTE: This sheet is a summary. It may not cover all possible information. If you have questions about this medicine, talk to your doctor, pharmacist, or health care provider.    2016, [employer]. (2011-11-09 16:59:39) Lidocaine; Prilocaine cream What is this medicine? LIDOCAINE; PRILOCAINE (LYE doe kane; PRIL oh kane) is a topical anesthetic that causes loss of feeling in the skin and surrounding tissues. It is used to numb the skin before procedures or injections. This medicine may be used for other purposes; ask your health care provider or pharmacist if you have questions. What should I tell my health care provider before I take this medicine? They need to know if you have any of these conditions: -glucose-6-phosphate deficiencies -heart disease -kidney or liver disease -methemoglobinemia -an unusual or allergic reaction to lidocaine, prilocaine, other medicines, foods, dyes, or preservatives -pregnant or trying to get pregnant -breast-feeding How should I use this medicine? This medicine is for external use only on the skin. Do not take by mouth. Follow the directions on the prescription label. Wash hands before and after use. Do not use more or leave in contact with the skin longer than directed. Do not apply to eyes or open wounds. It can cause irritation and blurred or temporary loss of vision. If this medicine comes in contact with your eyes, immediately rinse the eye with water. Do not touch or rub the eye. Contact your health care provider right away. Talk to your pediatrician regarding the use of this medicine in children. While this medicine may be prescribed for children for selected conditions, precautions do apply. Overdosage: If you think you have taken too much of this medicine contact a poison control center or emergency room at once. NOTE: This medicine is only for you. Do not share this medicine with others. What if I miss a dose? This  medicine is usually only applied once prior to each procedure. It must be in contact with the skin for a period of time for it to work. If you applied  this medicine later than directed, tell your health care professional before starting the procedure. What may interact with this medicine? -acetaminophen -chloroquine -dapsone -medicines to control heart rhythm -nitrates like nitroglycerin and nitroprusside -other ointments, creams, or sprays that may contain anesthetic medicine -phenobarbital -phenytoin -quinine -sulfonamides like sulfacetamide, sulfamethoxazole, sulfasalazine and others This list may not describe all possible interactions. Give your health care provider a list of all the medicines, herbs, non-prescription drugs, or dietary supplements you use. Also tell them if you smoke, drink alcohol, or use illegal drugs. Some items may interact with your medicine. What should I watch for while using this medicine? Be careful to avoid injury to the treated area while it is numb and you are not aware of pain. Avoid scratching, rubbing, or exposing the treated area to hot or cold temperatures until complete sensation has returned. The numb feeling will wear off a few hours after applying the cream. What side effects may I notice from receiving this medicine? Side effects that you should report to your doctor or health care professional as soon as possible: -blurred vision -chest pain -difficulty breathing -dizziness -drowsiness -fast or irregular heartbeat -skin rash or itching -swelling of your throat, lips, or face -trembling Side effects that usually do not require medical attention (report to your doctor or health care professional if they continue or are bothersome): -changes in ability to feel hot or cold -redness and swelling at the application site This list may not describe all possible side effects. Call your doctor for medical advice about side effects. You may report side  effects to FDA at 1-800-FDA-1088. Where should I keep my medicine? Keep out of reach of children. Store at room temperature between 15 and 30 degrees C (59 and 86 degrees F). Keep container tightly closed. Throw away any unused medicine after the expiration date. NOTE: This sheet is a summary. It may not cover all possible information. If you have questions about this medicine, talk to your doctor, pharmacist, or health care provider.    2016, [employer]. (2007-12-25 17:14:35) Dexamethasone injection What is this medicine? DEXAMETHASONE (dex a METH a sone) is a corticosteroid. It is used to treat inflammation of the skin, joints, lungs, and other organs. Common conditions treated include asthma, allergies, and arthritis. It is also used for other conditions, like blood disorders and diseases of the adrenal glands. This medicine may be used for other purposes; ask your health care provider or pharmacist if you have questions. What should I tell my health care provider before I take this medicine? They need to know if you have any of these conditions: -blood clotting problems -Cushing's syndrome -diabetes -glaucoma -heart problems or disease -high blood pressure -infection like herpes, measles, tuberculosis, or chickenpox -kidney disease -liver disease -mental problems -myasthenia gravis -osteoporosis -previous heart attack -seizures -stomach, ulcer or intestine disease including colitis and diverticulitis -thyroid problem -an unusual or allergic reaction to dexamethasone, corticosteroids, other medicines, lactose, foods, dyes, or preservatives -pregnant or trying to get pregnant -breast-feeding How should I use this medicine? This medicine is for injection into a muscle, joint, lesion, soft tissue, or vein. It is given by a health care professional in a hospital or clinic setting. Talk to your pediatrician regarding the use of this medicine in children. Special care may  be needed. Overdosage: If you think you have taken too much of this medicine contact a poison control center or emergency room at once. NOTE: This medicine is only for you. Do not share  this medicine with others. What if I miss a dose? This may not apply. If you are having a series of injections over a prolonged period, try not to miss an appointment. Call your doctor or health care professional to reschedule if you are unable to keep an appointment. What may interact with this medicine? Do not take this medicine with any of the following medications: -mifepristone, RU-486 -vaccines This medicine may also interact with the following medications: -amphotericin B -antibiotics like clarithromycin, erythromycin, and troleandomycin -aspirin and aspirin-like drugs -barbiturates like phenobarbital -carbamazepine -cholestyramine -cholinesterase inhibitors like donepezil, galantamine, rivastigmine, and tacrine -cyclosporine -digoxin -diuretics -ephedrine -male hormones, like estrogens or progestins and birth control pills -indinavir -isoniazid -ketoconazole -medicines for diabetes -medicines that improve muscle tone or strength for conditions like myasthenia gravis -NSAIDs, medicines for pain and inflammation, like ibuprofen or naproxen -phenytoin -rifampin -thalidomide -warfarin This list may not describe all possible interactions. Give your health care provider a list of all the medicines, herbs, non-prescription drugs, or dietary supplements you use. Also tell them if you smoke, drink alcohol, or use illegal drugs. Some items may interact with your medicine. What should I watch for while using this medicine? Your condition will be monitored carefully while you are receiving this medicine. If you are taking this medicine for a long time, carry an identification card with your name and address, the type and dose of your medicine, and your doctor's name and address. This medicine may  increase your risk of getting an infection. Stay away from people who are sick. Tell your doctor or health care professional if you are around anyone with measles or chickenpox. Talk to your health care provider before you get any vaccines that you take this medicine. If you are going to have surgery, tell your doctor or health care professional that you have taken this medicine within the last twelve months. Ask your doctor or health care professional about your diet. You may need to lower the amount of salt you eat. The medicine can increase your blood sugar. If you are a diabetic check with your doctor if you need help adjusting the dose of your diabetic medicine. What side effects may I notice from receiving this medicine? Side effects that you should report to your doctor or health care professional as soon as possible: -allergic reactions like skin rash, itching or hives, swelling of the face, lips, or tongue -black or tarry stools -change in the amount of urine -changes in vision -confusion, excitement, restlessness, a false sense of well-being -fever, sore throat, sneezing, cough, or other signs of infection, wounds that will not heal -hallucinations -increased thirst -mental depression, mood swings, mistaken feelings of self importance or of being mistreated -pain in hips, back, ribs, arms, shoulders, or legs -pain, redness, or irritation at the injection site -redness, blistering, peeling or loosening of the skin, including inside the mouth -rounding out of face -swelling of feet or lower legs -unusual bleeding or bruising -unusual tired or weak -wounds that do not heal Side effects that usually do not require medical attention (report to your doctor or health care professional if they continue or are bothersome): -diarrhea or constipation -change in taste -headache -nausea, vomiting -skin problems, acne, thin and shiny skin -touble sleeping -unusual growth of hair on the face  or body -weight gain This list may not describe all possible side effects. Call your doctor for medical advice about side effects. You may report side effects to FDA at 1-800-FDA-1088. Where should I  keep my medicine? This drug is given in a hospital or clinic and will not be stored at home. NOTE: This sheet is a summary. It may not cover all possible information. If you have questions about this medicine, talk to your doctor, pharmacist, or health care provider.    2016, [employer]. (2007-10-12 14:04:12)

## 2015-12-02 NOTE — Progress Notes (Signed)
[place]  CONSULT NOTE  Patient Care Team: Provider Not In System as PCP - General  CHIEF COMPLAINTS/PURPOSE OF CONSULTATION:  Adenocarcinoma of body/tail of pancreas, T4N0Mx History of TB, treated by [facility] History of MAC Weight Loss History Hyponatremia Tobacco Abuse    Pancreatic cancer ([place])   11/11/2015 - 11/13/2015 Hospital Admission abdominal pain, cramping, diarrhea, weight loss, hyponatremia CT pancreatic body mass   11/12/2015 Imaging 4.7 cm mass of pancreas to L of midline, involvement of several vascular structures also possibly the distal duodenum   11/21/2015 - 11/25/2015 Hospital Admission EUS on 5/23, hyponatremia   11/22/2015 Imaging Diffuse mesenteric edema, large hypo enhancing pancreatic mass, abutment of celiac axis, SMA, encasement of splenic artery, occludes splenic vein, compresses porta splenic confluence   11/25/2015 Procedure EUS Dr. Ardis Hughs, irreg mass in pancreatic body/tail. 4.2 cm, involvement of major blood vessels not appreciated due to size of mass   11/25/2015 Pathology Results malignant cells c/w adenocarcinoma    HISTORY OF PRESENTING ILLNESS:  Nicholas [name] 65 y.o. male is here because of newly diagnosed pancreatic carcinoma.  Nicholas [name] is accompanied by his sister. He was told his cancer was incurable but treatable. His sister lives in [place]. He lives by himself in his grandfather's old house.  He began feeling sick about 3-4 weeks ago. The first 2-3 weeks it felt like gas pains. He figured he had been eating something he shouldn't be eating. He continued to eat well in this time. When this began to get progressively worse he started to experience abdominal cramping. He went home and the pain did not resolve so he went to the ER. He has lost 25 lbs or more. He typically weighs 140-145 lbs. He enjoys cooking, notes he has been having to put leftovers in the fridge a lot lately. He has been eating lightly. He  has been drinking Boost/Ensure.   When he was first given pain medication in the hospital, he was moving his bowels daily and eating well. He began to notice he wasn't having bowel movements. He has miralax and notes that it works when he takes it.   He has not slept well for the last 2 to 3 nights. Last night was the worst. He took his pain medication, oxycodone 10 mg with Ensure and ginger ale. This pain medication wore off before he fell asleep so he took a sleeping pill he had previously gotten from the [place]. He took his last pain pill last night. He has previously called an ambulance due to the severe pain.  He was told to take his pain medication once every 6 hours. If he wakes up in the middle of the night, he will take another. Some days are better than others, explaining some days he only takes 2-3 pills. He was previously given hydrocodone 5 mg. He does not want strong pain medication. "I want something for the cramping. I feel like a woman on her menstrual period".   The patient has a history of tuberculosis that was treated. Marland Kitchen He was quarantined for 6 months at home. He notes he took medication for a total of one year. He notes he was also told he has MAC which they "cannot cure" and "he will always have." He has cut his smoking back since he had tuberculosis. He has begun smoking more with the recent pain, sometimes 1 ppd, "it varies". Since last Thursday, he has had 2 beers. It does not taste  right. He has never had acid reflux before, but now he does. "I am unable to burp".   The patient is here for further evaluation and discussion of newly diagnosed pancreatic cancer and future treatment options. He notes that he was told he could be treated with chemotherapy and that surgery and radiation were not good options.   MEDICAL HISTORY:  Past Medical History  Diagnosis Date  . Abdominal pain in male     "was told he has a Mass near pancreas"  . Cancer (HCC)     Skin cancer-ear, skin  cancer -leeg,cancer of leg muscle- left., basal cell   . Pancreatic mass april 2017  . TB (pulmonary tuberculosis)     3 yrs ago-tx" positive TB test aslso" was told he is not contagious.treatment done for regulat TB.    SURGICAL HISTORY: Past Surgical History  Procedure Laterality Date  . Leg surgery Left     " cancer removed from muscle"  . Colonoscopy w/ polypectomy    . Eus N/A 11/25/2015    Procedure: ESOPHAGEAL ENDOSCOPIC ULTRASOUND (EUS) RADIAL;  Surgeon: Rachael Fee, MD;  Location: [facility];  Service: Endoscopy;  Laterality: N/A;    SOCIAL HISTORY: Social History   Social History  . Marital Status: Single    Spouse Name: N/A  . Number of Children: N/A  . Years of Education: N/A   Occupational History  . Not on file.   Social History Main Topics  . Smoking status: Current Every Day Smoker -- 0.50 packs/day for 30 years    Types: Cigarettes  . Smokeless tobacco: Never Used  . Alcohol Use: 0.0 oz/week    0 Standard drinks or equivalent per week     Comment: 1-2 beers at night  . Drug Use: No  . Sexual Activity: No   Other Topics Concern  . Not on file   Social History Narrative   Single. He has been engaged 4 times. No children He has cut his smoking back since he had tuberculosis. He has smoked more with recent pain. Sometimes 1 ppd, "it varies". Since last Thursday, he has had 2 beers. It does not taste right. He has never had acid reflux before, but now he does. He was in the [employer]. Worked in [employer], [employer], and the post office.   FAMILY HISTORY: Family History  Problem Relation Age of Onset  . Pneumonia Father   . Stroke Mother    Mother living 71 years old at [facility].  Father deceased at 9 years old of pneumonia. He drank and drank a lot. 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 Had a massive heart attack at 75 yo 1 sister, lives in [place]  ALLERGIES:  has No Known Allergies.  MEDICATIONS:  Current  Outpatient Prescriptions  Medication Sig Dispense Refill  . aspirin EC 81 MG tablet Take 81 mg by mouth daily.    . feeding supplement, ENSURE ENLIVE, (ENSURE ENLIVE) LIQD Take 237 mLs by mouth 2 (two) times daily between meals. 60 Bottle 0  . hydrocerin (EUCERIN) CREA Apply 1 application topically 2 (two) times daily as needed (For dry skin.).    Marland Kitchen Menthol-Methyl Salicylate (THERA-GESIC) 1-15 % CREA Apply 1 application topically 4 (four) times daily as needed (For pain.).    Marland Kitchen Multiple Vitamins-Minerals (MULTIVITAMINS THER. W/MINERALS) TABS tablet Take 1 tablet by mouth daily.    . polyethylene glycol (MIRALAX / GLYCOLAX) packet Take 17 g by mouth daily. (Patient taking differently: Take 17  g by mouth daily as needed (For constipation.). ) 14 each 0  . Gemcitabine HCl (GEMZAR IV) Inject into the vein. To begin June 6    . lidocaine-prilocaine (EMLA) cream Apply a quarter size amount to port site 1 hour prior to chemo. Do not rub in. Cover with plastic wrap. 30 g 3  . ondansetron (ZOFRAN) 8 MG tablet Take 1 tablet (8 mg total) by mouth every 8 (eight) hours as needed for nausea or vomiting. 30 tablet 2  . oxyCODONE (OXYCONTIN) 15 mg 12 hr tablet Take 1 tablet (15 mg total) by mouth every 12 (twelve) hours. 60 tablet 0  . Oxycodone HCl 10 MG TABS Take 1 tablet (10 mg total) by mouth every 4 (four) hours as needed. 90 tablet 0  . PACLitaxel Protein-Bound Part (ABRAXANE IV) Inject into the vein. To begin June 6    . prochlorperazine (COMPAZINE) 10 MG tablet Take 1 tablet (10 mg total) by mouth every 6 (six) hours as needed for nausea or vomiting. 30 tablet 2   No current facility-administered medications for this visit.    Review of Systems  Constitutional: Positive for weight loss (25 lbs weight loss).  HENT: Negative.   Eyes: Negative.   Respiratory: Positive for shortness of breath.        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   Gastrointestinal: Positive for  heartburn, abdominal pain and constipation.       Constipation secondary to pain medication. Severe abdominal pain and cramping.  Genitourinary: Negative.   Musculoskeletal: Negative.   Skin: Negative.   Neurological: Negative.   Endo/Heme/Allergies: Negative.   Psychiatric/Behavioral: The patient has insomnia. The patient is not nervous/anxious.        Insomnia secondary to abdominal pain/cramping.  All other systems reviewed and are negative.  14 point ROS was done and is otherwise as detailed above or in HPI   PHYSICAL EXAMINATION: ECOG PERFORMANCE STATUS: 2 - Symptomatic, <50% confined to bed  Filed Vitals:   12/02/15 1527  BP: 136/92  Pulse: 113  Temp: 98.8 F (37.1 C)  Resp: 18   Filed Weights   12/02/15 1527  Weight: 118 lb 1.6 oz (53.57 kg)    Physical Exam  Constitutional: He is oriented to person, place, and time.  Thin, cachexic, pleasant and appropriate.  Cigarettes in upper pocket  HENT:  Head: Normocephalic and atraumatic.  Mouth/Throat: Oropharynx is clear and moist.  Eyes: Conjunctivae and EOM are normal. Pupils are equal, round, and reactive to light. Right eye exhibits no discharge. Left eye exhibits no discharge. No scleral icterus.  Neck: Normal range of motion. Neck supple. No JVD present. No tracheal deviation present. No thyromegaly present.  Cardiovascular: Normal rate and regular rhythm.   Murmur heard. Pulmonary/Chest: Effort normal. No stridor.  Coarse breath sounds bilaterally, cough  Abdominal: Soft. Bowel sounds are normal. He exhibits no distension and no mass. There is no tenderness. There is no rebound and no guarding.  Musculoskeletal: Normal range of motion. He exhibits no edema or tenderness.  Lymphadenopathy:    He has no cervical adenopathy.  Neurological: He is alert and oriented to person, place, and time. Gait normal.  Skin: Skin is warm and dry.  Psychiatric: Mood, memory, affect and judgment normal.  Nursing note and vitals  reviewed.   LABORATORY DATA:  I have reviewed the data as listed Lab Results  Component Value Date   WBC 11.1* 12/03/2015   HGB 14.1 12/03/2015  HCT 40.3 12/03/2015   MCV 97.1 12/03/2015   PLT 285 12/03/2015   CMP     Component Value Date/Time   NA 129* 12/03/2015 0108   K 3.4* 12/03/2015 0108   CL 91* 12/03/2015 0108   CO2 29 12/03/2015 0108   GLUCOSE 120* 12/03/2015 0108   BUN 8 12/03/2015 0108   CREATININE 0.45* 12/03/2015 0108   CALCIUM 9.3 12/03/2015 0108   PROT 8.2* 12/03/2015 0108   ALBUMIN 4.2 12/03/2015 0108   AST 20 12/03/2015 0108   ALT 12* 12/03/2015 0108   ALKPHOS 82 12/03/2015 0108   BILITOT 0.2* 12/03/2015 0108   GFRNONAA >60 12/03/2015 0108   GFRAA >60 12/03/2015 0108   Results for JEYSON, BARTELS (MRN [id number]) as of 12/02/2015 08:20  Ref. Range 11/12/2015 05:13  CA 19-9 Latest Ref Range: 0-35 U/mL 1576 (H)     PATHOLOGY:     RADIOGRAPHIC STUDIES: I have personally reviewed the radiological images as listed and agreed with the findings in the report.  CLINICAL DATA: 65 year old male with abdominal pain.  EXAM: CT ABDOMEN AND PELVIS WITH CONTRAST  TECHNIQUE: Multidetector CT imaging of the abdomen and pelvis was performed using the standard protocol following bolus administration of intravenous contrast.  CONTRAST: 119mL ISOVUE-300 IOPAMIDOL (ISOVUE-300) INJECTION 61%  COMPARISON: CT dated 11/11/2015  FINDINGS: There is emphysematous changes of the lung bases. Focal area of scarring and calcification noted at the left lung base. No intra-abdominal free air. There is diffuse mesenteric stranding and edema as well as small ascites, increased compared to the most recent study of 11/11/2015.  There are two subcentimeter hypodense densities in the right lobe of the liver inferiorly adjacent the gallbladder which are incompletely characterized. Metastatic disease is not excluded. MRI without and with contrast may provide  better characterisation. A faint subcentimeter hypodensity is also noted in the left lobe of the liver is superiorly (series 2, image 12). There is mild irregularity of the hepatic contour. The gallbladder appears unremarkable.  The head of the pancreas appears unremarkable. There is a large hypo enhancing lesion in the body of the pancreas measuring approximately 4.3 x 4.0 cm grossly similar to prior study. Difference in measurement between the 2 studies is likely related to measurement technique. There is atrophy of the distal gland with dilatation of the duct. This lesion most likely represents a primary pancreatic malignancy. The spleen, adrenal glands, kidneys, visualized ureters, and urinary bladder appear unremarkable. The prostate and seminal vesicles are grossly unremarkable.  There is abutment of the distal duodenum at the ligament of Treitz by the pancreatic mass. There is no evidence of bowel obstruction or active inflammation. Moderate stool noted throughout the colon. The appendix appears unremarkable.  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 Focal area of calcified atherosclerotic plaque inferior to the renal arteries with moderate narrowing of the lumen of the aorta (series 8, image 17). The origins of the celiac axis, SMA, and IMA appear patent. There is abutment of the proximal aspect of the celiac artery as well as the SMA by the pancreatic mass without definite evidence of encasement. There is encasement of the splenic artery. There is compression of the porta splenic confluence by the pancreatic mass as well as narrowing of the central SMV. The SMV, and main portal vein however remain patent. No the splenic vein is not visualized and appears to be occluded by the mass. No portal venous gas identified.  Multiple small upper abdominal and mesenteric lymph nodes noted. A nodular  density posterior to the pancreatic mass and anterior to the left  renal vein noted measuring 7 mm in short axis (series 2, image 27).  There is osteopenia with degenerative changes of the spine. No acute fracture. Probable small bone island and in the left acetabulum anteriorly  IMPRESSION: Diffuse mesenteric edema and small ascites with interval increase in the size of the free fluid compared to prior study.  Large hypo enhancing pancreatic mass as described most compatible with primary pancreatic neoplasm. There is abutment of the celiac axis, SMA and encasement of the splenic artery. The mass occludes the splenic vein and compresses the porta splenic confluence  Abutment of the distal duodenum at the ligament of Treitz peripancreatic mass. No bowel obstruction.  Faint subcentimeter hepatic hypodense lesions, not characterized. Metastatic disease is not excluded. Further evaluation with MRI without and with contrast recommended.   Electronically Signed  By: Anner Crete M.D.  On: 11/22/2015 03:56   EUS:        ASSESSMENT & PLAN:  Adenocarcinoma of body/tail of pancreas, T4N0Mx History of TB, treated by [facility] History of MAC Weight Loss History Hyponatremia Tobacco Abuse Cancer related pain  Discussed his imaging with radiology, although no direct evidence of vascular invasion on imaging the tumor abuts the major vessels and it is highly likely there is invasion. Also discussed with Dr. Ardis Hughs who feels patient has unresectable disease. He could not observe directly on EUS secondary to tumor size.  I would also be concerned about his ability to tolerate a major surgery his PS is marginal.  I have recommended the following: 1. MRI abdomen for additional imaging of the abnormalities noted in the liver, if he has metastatic disease I would like to document 2. Obtaining records regarding treatment of his TB from the [place]. Also records pertaining to his history of MAC prior to initiation of  chemotherapy 3. Port placement. 4. Oxycontin and oxycodone. Teaching was performed today regarding long acting and short acting pain medications and how to use. He was given a constipation prevention sheet. 5. Chemotherapy teaching for Gemzar/abraxane 6. Anticipate chemotherapy start date at the end of next week  7. CA 19-9 day of cycle #1 day 1 of chemotherapy 8. Hyponatremia appears chronic, although improved with adequate hydration. Will monitor. Will arrange for nutrition consultation.  ORDERS PLACED FOR THIS ENCOUNTER: Orders Placed This Encounter  Procedures  . IR Fluoro Guide CV Line Left  . CBC with Differential  . Comprehensive metabolic panel  . Cancer antigen 19-9    MEDICATIONS PRESCRIBED THIS ENCOUNTER: Meds ordered this encounter  Medications  . oxyCODONE (OXYCONTIN) 15 mg 12 hr tablet    Sig: Take 1 tablet (15 mg total) by mouth every 12 (twelve) hours.    Dispense:  60 tablet    Refill:  0  . DISCONTD: Oxycodone HCl 10 MG TABS    Sig: Take 1 tablet (10 mg total) by mouth every 6 (six) hours as needed.    Dispense:  90 tablet    Refill:  0  . lidocaine-prilocaine (EMLA) cream    Sig: Apply a quarter size amount to port site 1 hour prior to chemo. Do not rub in. Cover with plastic wrap.    Dispense:  30 g    Refill:  3  . ondansetron (ZOFRAN) 8 MG tablet    Sig: Take 1 tablet (8 mg total) by mouth every 8 (eight) hours as needed for nausea or vomiting.    Dispense:  30 tablet    Refill:  2  . prochlorperazine (COMPAZINE) 10 MG tablet    Sig: Take 1 tablet (10 mg total) by mouth every 6 (six) hours as needed for nausea or vomiting.    Dispense:  30 tablet    Refill:  2   All questions were answered. The patient knows to call the clinic with any problems, questions or concerns.  This document serves as a record of services personally performed by Ancil Linsey, MD. It was created on her behalf by Arlyce Harman, a trained medical scribe. The creation of this  record is based on the scribe's personal observations and the provider's statements to them. This document has been checked and approved by the attending provider.  I have reviewed the above documentation for accuracy and completeness, and I agree with the above.  This note was electronically signed.    Molli Hazard, MD  12/03/2015 5:57 PM

## 2015-12-02 NOTE — Progress Notes (Deleted)
[place]  CONSULT NOTE  Patient Care Team: Provider Not In System as PCP - General  CHIEF COMPLAINTS/PURPOSE OF CONSULTATION:  ***  No history exists.    HISTORY OF PRESENTING ILLNESS:  Nicholas [name] 65 y.o. male is here because of ***    MEDICAL HISTORY:  Past Medical History  Diagnosis Date  . Abdominal pain in male     "was told he has a Mass near pancreas"  . Cancer (HCC)     Skin cancer-ear, skin cancer -leeg,cancer of leg muscle- left., basal cell   . Pancreatic mass april 2017  . TB (pulmonary tuberculosis)     3 yrs ago-tx" positive TB test aslso" was told he is not contagious.treatment done for regulat TB.    SURGICAL HISTORY: Past Surgical History  Procedure Laterality Date  . Leg surgery Left     " cancer removed from muscle"  . Colonoscopy w/ polypectomy    . Eus N/A 11/25/2015    Procedure: ESOPHAGEAL ENDOSCOPIC ULTRASOUND (EUS) RADIAL;  Surgeon: Milus Banister, MD;  Location: [facility];  Service: Endoscopy;  Laterality: N/A;    SOCIAL HISTORY: Social History   Social History  . Marital Status: Single    Spouse Name: N/A  . Number of Children: N/A  . Years of Education: N/A   Occupational History  . Not on file.   Social History Main Topics  . Smoking status: Current Every Day Smoker -- 0.50 packs/day for 30 years    Types: Cigarettes  . Smokeless tobacco: Never Used  . Alcohol Use: 0.0 oz/week    0 Standard drinks or equivalent per week     Comment: 1-2 beers at night  . Drug Use: No  . Sexual Activity: No   Other Topics Concern  . Not on file   Social History Narrative    FAMILY HISTORY: Family History  Problem Relation Age of Onset  . Pneumonia Father   . Stroke Mother     ALLERGIES:  has No Known Allergies.  MEDICATIONS:  Current Outpatient Prescriptions  Medication Sig Dispense Refill  . aspirin EC 81 MG tablet Take 81 mg by mouth daily.    . feeding supplement, ENSURE ENLIVE, (ENSURE ENLIVE)  LIQD Take 237 mLs by mouth 2 (two) times daily between meals. 60 Bottle 0  . hydrocerin (EUCERIN) CREA Apply 1 application topically 2 (two) times daily as needed (For dry skin.).    Marland Kitchen Menthol-Methyl Salicylate (THERA-GESIC) 1-15 % CREA Apply 1 application topically 4 (four) times daily as needed (For pain.).    Marland Kitchen Multiple Vitamins-Minerals (MULTIVITAMINS THER. W/MINERALS) TABS tablet Take 1 tablet by mouth daily.    Marland Kitchen oxyCODONE 10 MG TABS Take 1 tablet (10 mg total) by mouth every 6 (six) hours as needed for moderate pain. 45 tablet 0  . polyethylene glycol (MIRALAX / GLYCOLAX) packet Take 17 g by mouth daily. (Patient taking differently: Take 17 g by mouth daily as needed (For constipation.). ) 14 each 0   No current facility-administered medications for this visit.    ROS 14 point ROS was done and is otherwise as detailed above or in HPI   PHYSICAL EXAMINATION: ECOG PERFORMANCE STATUS: {CHL ONC ECOG PS:[id number]}  There were no vitals filed for this visit. There were no vitals filed for this visit.   Physical Exam    LABORATORY DATA:  I have reviewed the data as listed Lab Results  Component Value Date   WBC 7.6 11/22/2015  HGB 12.5* 11/22/2015   HCT 35.4* 11/22/2015   MCV 96.2 11/22/2015   PLT 240 11/22/2015   CMP     Component Value Date/Time   NA 131* 11/24/2015 0401   K 3.9 11/24/2015 0401   CL 98* 11/24/2015 0401   CO2 27 11/24/2015 0401   GLUCOSE 122* 11/24/2015 0401   BUN 7 11/24/2015 0401   CREATININE 0.63 11/24/2015 0401   CALCIUM 8.7* 11/24/2015 0401   PROT 7.1 11/22/2015 0003   ALBUMIN 3.6 11/22/2015 0003   AST 18 11/22/2015 0003   ALT 10* 11/22/2015 0003   ALKPHOS 61 11/22/2015 0003   BILITOT 0.3 11/22/2015 0003   GFRNONAA >60 11/24/2015 0401   GFRAA >60 11/24/2015 0401   Results for JAMONTE, KLEMM (MRN [id number]) as of 12/02/2015 08:20  Ref. Range 11/12/2015 05:13  CA 19-9 Latest Ref Range: 0-35 U/mL 1576 (H)      PATHOLOGY:     RADIOGRAPHIC STUDIES: I have personally reviewed the radiological images as listed and agreed with the findings in the report. No results found. CLINICAL DATA: 65 year old male with abdominal pain.  EXAM: CT ABDOMEN AND PELVIS WITH CONTRAST  TECHNIQUE: Multidetector CT imaging of the abdomen and pelvis was performed using the standard protocol following bolus administration of intravenous contrast.  CONTRAST: 170mL ISOVUE-300 IOPAMIDOL (ISOVUE-300) INJECTION 61%  COMPARISON: CT dated 11/11/2015  FINDINGS: There is emphysematous changes of the lung bases. Focal area of scarring and calcification noted at the left lung base. No intra-abdominal free air. There is diffuse mesenteric stranding and edema as well as small ascites, increased compared to the most recent study of 11/11/2015.  There are two subcentimeter hypodense densities in the right lobe of the liver inferiorly adjacent the gallbladder which are incompletely characterized. Metastatic disease is not excluded. MRI without and with contrast may provide better characterisation. A faint subcentimeter hypodensity is also noted in the left lobe of the liver is superiorly (series 2, image 12). There is mild irregularity of the hepatic contour. The gallbladder appears unremarkable.  The head of the pancreas appears unremarkable. There is a large hypo enhancing lesion in the body of the pancreas measuring approximately 4.3 x 4.0 cm grossly similar to prior study. Difference in measurement between the 2 studies is likely related to measurement technique. There is atrophy of the distal gland with dilatation of the duct. This lesion most likely represents a primary pancreatic malignancy. The spleen, adrenal glands, kidneys, visualized ureters, and urinary bladder appear unremarkable. The prostate and seminal vesicles are grossly unremarkable.  There is abutment of the distal duodenum at  the ligament of Treitz by the pancreatic mass. There is no evidence of bowel obstruction or active inflammation. Moderate stool noted throughout the colon. The appendix appears unremarkable.  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 Focal area of calcified atherosclerotic plaque inferior to the renal arteries with moderate narrowing of the lumen of the aorta (series 8, image 17). The origins of the celiac axis, SMA, and IMA appear patent. There is abutment of the proximal aspect of the celiac artery as well as the SMA by the pancreatic mass without definite evidence of encasement. There is encasement of the splenic artery. There is compression of the porta splenic confluence by the pancreatic mass as well as narrowing of the central SMV. The SMV, and main portal vein however remain patent. No the splenic vein is not visualized and appears to be occluded by the mass. No portal venous gas identified.  Multiple small upper  abdominal and mesenteric lymph nodes noted. A nodular density posterior to the pancreatic mass and anterior to the left renal vein noted measuring 7 mm in short axis (series 2, image 27).  There is osteopenia with degenerative changes of the spine. No acute fracture. Probable small bone island and in the left acetabulum anteriorly  IMPRESSION: Diffuse mesenteric edema and small ascites with interval increase in the size of the free fluid compared to prior study.  Large hypo enhancing pancreatic mass as described most compatible with primary pancreatic neoplasm. There is abutment of the celiac axis, SMA and encasement of the splenic artery. The mass occludes the splenic vein and compresses the porta splenic confluence  Abutment of the distal duodenum at the ligament of Treitz peripancreatic mass. No bowel obstruction.  Faint subcentimeter hepatic hypodense lesions, not characterized. Metastatic disease is not excluded. Further evaluation with  MRI without and with contrast recommended.   Electronically Signed  By: Anner Crete M.D.  On: 11/22/2015 03:56       ASSESSMENT & PLAN:  No matching staging information was found for the patient. No problem-specific assessment & plan notes found for this encounter.   ORDERS PLACED FOR THIS ENCOUNTER: No orders of the defined types were placed in this encounter.    MEDICATIONS PRESCRIBED THIS ENCOUNTER: No orders of the defined types were placed in this encounter.     All questions were answered. The patient knows to call the clinic with any problems, questions or concerns.  This note was electronically signed.    Molli Hazard, MD  12/02/2015 8:18 AM

## 2015-12-03 ENCOUNTER — Emergency Department (HOSPITAL_COMMUNITY): Payer: Medicaid Other

## 2015-12-03 ENCOUNTER — Other Ambulatory Visit (HOSPITAL_COMMUNITY): Payer: Self-pay | Admitting: *Deleted

## 2015-12-03 ENCOUNTER — Other Ambulatory Visit: Payer: Self-pay | Admitting: Radiology

## 2015-12-03 ENCOUNTER — Other Ambulatory Visit (HOSPITAL_COMMUNITY): Payer: Self-pay | Admitting: Oncology

## 2015-12-03 ENCOUNTER — Encounter (HOSPITAL_COMMUNITY): Payer: Self-pay | Admitting: Hematology & Oncology

## 2015-12-03 ENCOUNTER — Encounter (HOSPITAL_COMMUNITY): Payer: Self-pay | Admitting: Emergency Medicine

## 2015-12-03 ENCOUNTER — Emergency Department (HOSPITAL_COMMUNITY)
Admission: EM | Admit: 2015-12-03 | Discharge: 2015-12-03 | Disposition: A | Discharge status: Home / Self Care | Payer: Medicaid Other | Attending: Emergency Medicine | Admitting: Emergency Medicine

## 2015-12-03 ENCOUNTER — Encounter (HOSPITAL_COMMUNITY): Payer: Medicaid Other

## 2015-12-03 DIAGNOSIS — K769 Liver disease, unspecified: Secondary | ICD-10-CM

## 2015-12-03 DIAGNOSIS — Z8611 Personal history of tuberculosis: Secondary | ICD-10-CM

## 2015-12-03 DIAGNOSIS — R109 Unspecified abdominal pain: Secondary | ICD-10-CM

## 2015-12-03 DIAGNOSIS — C258 Malignant neoplasm of overlapping sites of pancreas: Secondary | ICD-10-CM

## 2015-12-03 DIAGNOSIS — K8689 Other specified diseases of pancreas: Secondary | ICD-10-CM

## 2015-12-03 DIAGNOSIS — C251 Malignant neoplasm of body of pancreas: Secondary | ICD-10-CM

## 2015-12-03 LAB — CBC WITH DIFFERENTIAL/PLATELET
BASOS ABS: 0 10*3/uL (ref 0.0–0.1)
Basophils Relative: 0 %
Eosinophils Absolute: 0.1 10*3/uL (ref 0.0–0.7)
Eosinophils Relative: 1 %
HEMATOCRIT: 40.3 % (ref 39.0–52.0)
HEMOGLOBIN: 14.1 g/dL (ref 13.0–17.0)
LYMPHS PCT: 15 %
Lymphs Abs: 1.6 10*3/uL (ref 0.7–4.0)
MCH: 34 pg (ref 26.0–34.0)
MCHC: 35 g/dL (ref 30.0–36.0)
MCV: 97.1 fL (ref 78.0–100.0)
Monocytes Absolute: 1.3 10*3/uL — ABNORMAL HIGH (ref 0.1–1.0)
Monocytes Relative: 12 %
NEUTROS ABS: 8 10*3/uL — AB (ref 1.7–7.7)
NEUTROS PCT: 72 %
Platelets: 285 10*3/uL (ref 150–400)
RBC: 4.15 MIL/uL — AB (ref 4.22–5.81)
RDW: 12.2 % (ref 11.5–15.5)
WBC: 11.1 10*3/uL — AB (ref 4.0–10.5)

## 2015-12-03 LAB — URINE MICROSCOPIC-ADD ON: Squamous Epithelial / LPF: NONE SEEN

## 2015-12-03 LAB — COMPREHENSIVE METABOLIC PANEL
ALBUMIN: 4.2 g/dL (ref 3.5–5.0)
ALT: 12 U/L — AB (ref 17–63)
AST: 20 U/L (ref 15–41)
Alkaline Phosphatase: 82 U/L (ref 38–126)
Anion gap: 9 (ref 5–15)
BUN: 8 mg/dL (ref 6–20)
CHLORIDE: 91 mmol/L — AB (ref 101–111)
CO2: 29 mmol/L (ref 22–32)
CREATININE: 0.45 mg/dL — AB (ref 0.61–1.24)
Calcium: 9.3 mg/dL (ref 8.9–10.3)
GFR calc Af Amer: >60 mL/min (ref >60)
GLUCOSE: 120 mg/dL — AB (ref 65–99)
POTASSIUM: 3.4 mmol/L — AB (ref 3.5–5.1)
Sodium: 129 mmol/L — ABNORMAL LOW (ref 135–145)
Total Bilirubin: 0.2 mg/dL — ABNORMAL LOW (ref 0.3–1.2)
Total Protein: 8.2 g/dL — ABNORMAL HIGH (ref 6.5–8.1)

## 2015-12-03 LAB — URINALYSIS, ROUTINE W REFLEX MICROSCOPIC
Bilirubin Urine: NEGATIVE
GLUCOSE, UA: NEGATIVE mg/dL
Ketones, ur: NEGATIVE mg/dL
LEUKOCYTES UA: NEGATIVE
Nitrite: NEGATIVE
PH: 7 (ref 5.0–8.0)
PROTEIN: NEGATIVE mg/dL

## 2015-12-03 LAB — LIPASE, BLOOD: Lipase: 39 U/L (ref 11–51)

## 2015-12-03 MED ORDER — MORPHINE SULFATE (PF) 4 MG/ML IV SOLN
4.0000 mg | Freq: Once | INTRAVENOUS | Status: AC
  Administered 2015-12-03: 4 mg via INTRAVENOUS
  Filled 2015-12-03: qty 1

## 2015-12-03 MED ORDER — SODIUM CHLORIDE 0.9 % IV BOLUS (SEPSIS)
1000.0000 mL | Freq: Once | INTRAVENOUS | Status: AC
  Administered 2015-12-03: 1000 mL via INTRAVENOUS

## 2015-12-03 MED ORDER — ONDANSETRON HCL 4 MG/2ML IJ SOLN
4.0000 mg | Freq: Once | INTRAMUSCULAR | Status: AC
  Administered 2015-12-03: 4 mg via INTRAVENOUS
  Filled 2015-12-03: qty 2

## 2015-12-03 MED ORDER — OXYCODONE HCL 10 MG PO TABS: 10.0000 mg | ORAL_TABLET | ORAL | Status: DC | PRN

## 2015-12-03 NOTE — ED Notes (Signed)
Pt provided drink at this time  

## 2015-12-03 NOTE — ED Provider Notes (Signed)
CSN: [id number]     Arrival date & time 12/02/15  2319 History  By signing my name below, I, Nicole Kindred, attest that this documentation has been prepared under the direction and in the presence of Ezequiel Essex, MD.   Electronically Signed: Nicole Kindred, ED Scribe. 12/03/2015. 1:04 AM   Chief Complaint  Patient presents with  . Abdominal Pain   The history is provided by the patient. No language interpreter was used.   HPI Comments: Nicholas [name] is a 65 y.o. male with PMHx of pancreas cancer who presents to the Emergency Department complaining of gradual onset, cramping, diffuse abdominal pain, ongoing for four weeks. Pt reports the pain radiates to his back. He states the pain keeps him from sleeping at night. Pt was diagnosed with pancreatic cancer about one week ago. Pt is prescribed 15 mg Oxycodone HCl by Dr. Ancil Linsey. He ran out of his pain medication because he has taken 1 pill every 4 hours instead of 1 pill every 6 hours. He states he is due for a refill of his medication tomorrow. No other associated symptoms noted. No worsening or alleviating factors noted. Pt denies fevers, chills, nausea, vomiting, chest pain, acute shortness of breath, dysuria, hematuria, or any other pertinent symptoms.   Past Medical History  Diagnosis Date  . Abdominal pain in male     "was told he has a Mass near pancreas"  . Cancer (HCC)     Skin cancer-ear, skin cancer -leeg,cancer of leg muscle- left., basal cell   . Pancreatic mass april 2017  . TB (pulmonary tuberculosis)     3 yrs ago-tx" positive TB test aslso" was told he is not contagious.treatment done for regulat TB.   Past Surgical History  Procedure Laterality Date  . Leg surgery Left     " cancer removed from muscle"  . Colonoscopy w/ polypectomy    . Eus N/A 11/25/2015    Procedure: ESOPHAGEAL ENDOSCOPIC ULTRASOUND (EUS) RADIAL;  Surgeon: Milus Banister, MD;  Location: [facility];  Service: Endoscopy;   Laterality: N/A;   Family History  Problem Relation Age of Onset  . Pneumonia Father   . Stroke Mother    Social History  Substance Use Topics  . Smoking status: Current Every Day Smoker -- 0.50 packs/day for 30 years    Types: Cigarettes  . Smokeless tobacco: Never Used  . Alcohol Use: 0.0 oz/week    0 Standard drinks or equivalent per week     Comment: 1-2 beers at night    Review of Systems A complete 10 system review of systems was obtained and all systems are negative except as noted in the HPI and PMH.   Allergies  Review of patient's allergies indicates no known allergies.  Home Medications   Prior to Admission medications   Medication Sig Start Date End Date Taking? Authorizing Provider  aspirin EC 81 MG tablet Take 81 mg by mouth daily.    Historical Provider, MD  feeding supplement, ENSURE ENLIVE, (ENSURE ENLIVE) LIQD Take 237 mLs by mouth 2 (two) times daily between meals. 11/25/15   Shanker Kristeen Mans, MD  Gemcitabine HCl (GEMZAR IV) Inject into the vein. To begin June 6    Historical Provider, MD  hydrocerin (EUCERIN) CREA Apply 1 application topically 2 (two) times daily as needed (For dry skin.).    Historical Provider, MD  lidocaine-prilocaine (EMLA) cream Apply a quarter size amount to port site 1 hour prior to chemo. Do not rub  in. Cover with plastic wrap. 12/02/15   Patrici Ranks, MD  Menthol-Methyl Salicylate (THERA-GESIC) 1-15 % CREA Apply 1 application topically 4 (four) times daily as needed (For pain.).    Historical Provider, MD  Multiple Vitamins-Minerals (MULTIVITAMINS THER. W/MINERALS) TABS tablet Take 1 tablet by mouth daily.    Historical Provider, MD  ondansetron (ZOFRAN) 8 MG tablet Take 1 tablet (8 mg total) by mouth every 8 (eight) hours as needed for nausea or vomiting. 12/02/15   Patrici Ranks, MD  oxyCODONE (OXYCONTIN) 15 mg 12 hr tablet Take 1 tablet (15 mg total) by mouth every 12 (twelve) hours. 12/02/15   Patrici Ranks, MD  Oxycodone  HCl 10 MG TABS Take 1 tablet (10 mg total) by mouth every 6 (six) hours as needed. 12/02/15   Patrici Ranks, MD  PACLitaxel Protein-Bound Part (ABRAXANE IV) Inject into the vein. To begin June 6    Historical Provider, MD  polyethylene glycol (MIRALAX / GLYCOLAX) packet Take 17 g by mouth daily. Patient taking differently: Take 17 g by mouth daily as needed (For constipation.).  11/25/15   Shanker Kristeen Mans, MD  prochlorperazine (COMPAZINE) 10 MG tablet Take 1 tablet (10 mg total) by mouth every 6 (six) hours as needed for nausea or vomiting. 12/02/15   Patrici Ranks, MD   BP 175/82 mmHg  Pulse 92  Temp(Src) 98.2 F (36.8 C)  Resp 20  Ht 5\' 11"  (1.803 m)  Wt 118 lb (53.524 kg)  BMI 16.46 kg/m2  SpO2 100% Physical Exam  Constitutional: He is oriented to person, place, and time. He appears well-developed and well-nourished. No distress.  HENT:  Head: Normocephalic and atraumatic.  Mouth/Throat: Oropharynx is clear and moist. No oropharyngeal exudate.  Eyes: Conjunctivae and EOM are normal. Pupils are equal, round, and reactive to light.  Neck: Normal range of motion. Neck supple.  No meningismus.  Cardiovascular: Normal rate, regular rhythm, normal heart sounds and intact distal pulses.   No murmur heard. Pulmonary/Chest: Effort normal and breath sounds normal. No respiratory distress.  Coarse breath sounds bilaterally.  Abdominal: Soft. There is tenderness. There is no rebound and no guarding.  Diffuse abdominal TTP.  Musculoskeletal: Normal range of motion. He exhibits no edema or tenderness.  Neurological: He is alert and oriented to person, place, and time. No cranial nerve deficit. He exhibits normal muscle tone. Coordination normal.  No ataxia on finger to nose bilaterally. No pronator drift. 5/5 strength throughout. CN 2-12 intact.Equal grip strength. Sensation intact.   Skin: Skin is warm.  Psychiatric: He has a normal mood and affect. His behavior is normal.  Nursing  note and vitals reviewed.   ED Course  Procedures (including critical care time) DIAGNOSTIC STUDIES: Oxygen Saturation is 100% on RA, normal by my interpretation.    COORDINATION OF CARE: 1:04 AM Discussed treatment plan which includes CBC with differential/platelet, CMP, lipase, and urinalysis with pt at bedside and pt agreed to plan.  Labs Review Labs Reviewed  CBC WITH DIFFERENTIAL/PLATELET - Abnormal; Notable for the following:    WBC 11.1 (*)    RBC 4.15 (*)    Neutro Abs 8.0 (*)    Monocytes Absolute 1.3 (*)    All other components within normal limits  COMPREHENSIVE METABOLIC PANEL - Abnormal; Notable for the following:    Sodium 129 (*)    Potassium 3.4 (*)    Chloride 91 (*)    Glucose, Bld 120 (*)    Creatinine, Ser 0.45 (*)  Total Protein 8.2 (*)    ALT 12 (*)    Total Bilirubin 0.2 (*)    All other components within normal limits  URINALYSIS, ROUTINE W REFLEX MICROSCOPIC (NOT AT [facility]) - Abnormal; Notable for the following:    APPearance HAZY (*)    Specific Gravity, Urine <1.005 (*)    Hgb urine dipstick SMALL (*)    All other components within normal limits  URINE MICROSCOPIC-ADD ON - Abnormal; Notable for the following:    Bacteria, UA MANY (*)    All other components within normal limits  LIPASE, BLOOD    Imaging Review Dg Abd Acute W/chest  12/03/2015  CLINICAL DATA:  Acute onset of generalized abdominal pain. Initial encounter. EXAM: DG ABDOMEN ACUTE W/ 1V CHEST COMPARISON:  Chest radiograph performed 12/25/2014, and CT of the abdomen and pelvis from 11/22/2015 FINDINGS: The lungs are well-aerated. Left apical scarring and multiple nodular opacities are seen scattered about both lungs, significantly improved at the left lung apex and perhaps mildly worsened at the left midlung zone. This is compatible with the patient's history of tuberculosis. Would correlate with the patient's symptoms to help exclude acute tuberculosis. There is no evidence of pleural  effusion or pneumothorax. The cardiomediastinal silhouette is within normal limits. The visualized bowel gas pattern is unremarkable. Scattered stool and air are seen within the colon; there is no evidence of small bowel dilatation to suggest obstruction. No free intra-abdominal air is identified on the provided upright view. No acute osseous abnormalities are seen; the sacroiliac joints are unremarkable in appearance. Chronic right-sided rib deformities are noted. IMPRESSION: 1. Unremarkable bowel gas pattern; no free intra-abdominal air seen. Small to moderate amount of stool noted in the colon. 2. Left apical scarring and multiple nodular opacities about both lungs, significantly improved at the left lung apex and perhaps mildly worse at the left midlung zone, in comparison to 2016. This is compatible with the patient's history of tuberculosis. Would correlate with the patient's symptoms to help exclude acute tuberculosis. Electronically Signed   By: Garald Balding M.D.   On: 12/03/2015 02:39   I have personally reviewed and evaluated these images and lab results as part of my medical decision-making.   EKG Interpretation None      MDM   Final diagnoses:  Abdominal pain, unspecified abdominal location  Pancreatic mass   Patient with known history of pancreatic mass presenting with worsening abdominal pain. States he is out of his oxycodone and cannot get it filled until tomorrow. No vomiting or fever. No change in chronic pain pattern.  Narcotic database shows 45 tablets of oxycodone 10 mg filled on May 23.  Abdomen soft without peritoneal signs. AAS without obstruction.  Chronic TB changes in lungs.  No current fever or cough. He reports being treated and "cured" of TB 4 years ago.  Labs with mild hyponatremia, no acute change. Pain improved with treatment in the ED. He is resting comfortably.  No indication for repeat abdominal CT.   Patient feels improved, has oncology followup  tomorrow. He states he does not need pain medication prescriptions and will be able to pick up his medications tomorrow. Return precautions discussed.  I personally performed the services described in this documentation, which was scribed in my presence. The recorded information has been reviewed and is accurate.   Ezequiel Essex, MD 12/03/15 [phone]

## 2015-12-03 NOTE — ED Notes (Signed)
Pt c/o abd pain and has ran out of pain meds. Pt states he cannot get them filled until Thursday.

## 2015-12-03 NOTE — ED Notes (Signed)
Pt says they missed up his pain meds & was told they would have to redo. Pt states cant get filled till tomorrow. Pt says he ran out 2 days ago.

## 2015-12-03 NOTE — Discharge Instructions (Signed)
Abdominal Pain, Adult Follow up with your oncologist as scheduled. Return to the ED if you develop new or worsening symptoms. Many things can cause abdominal pain. Usually, abdominal pain is not caused by a disease and will improve without treatment. It can often be observed and treated at home. Your health care provider will do a physical exam and possibly order blood tests and X-rays to help determine the seriousness of your pain. However, in many cases, more time must pass before a clear cause of the pain can be found. Before that point, your health care provider may not know if you need more testing or further treatment. HOME CARE INSTRUCTIONS Monitor your abdominal pain for any changes. The following actions may help to alleviate any discomfort you are experiencing:  Only take over-the-counter or prescription medicines as directed by your health care provider.  Do not take laxatives unless directed to do so by your health care provider.  Try a clear liquid diet (broth, tea, or water) as directed by your health care provider. Slowly move to a bland diet as tolerated. SEEK MEDICAL CARE IF:  You have unexplained abdominal pain.  You have abdominal pain associated with nausea or diarrhea.  You have pain when you urinate or have a bowel movement.  You experience abdominal pain that wakes you in the night.  You have abdominal pain that is worsened or improved by eating food.  You have abdominal pain that is worsened with eating fatty foods.  You have a fever. SEEK IMMEDIATE MEDICAL CARE IF:  Your pain does not go away within 2 hours.  You keep throwing up (vomiting).  Your pain is felt only in portions of the abdomen, such as the right side or the left lower portion of the abdomen.  You pass bloody or black tarry stools. MAKE SURE YOU:  Understand these instructions.  Will watch your condition.  Will get help right away if you are not doing well or get worse.   This  information is not intended to replace advice given to you by your health care provider. Make sure you discuss any questions you have with your health care provider.   Document Released: 03/31/2005 Document Revised: 03/12/2015 Document Reviewed: 02/28/2013 Elsevier Interactive Patient Education [employer].

## 2015-12-04 ENCOUNTER — Other Ambulatory Visit (HOSPITAL_COMMUNITY): Payer: Self-pay | Admitting: Oncology

## 2015-12-04 ENCOUNTER — Ambulatory Visit (HOSPITAL_COMMUNITY)
Admission: RE | Admit: 2015-12-04 | Discharge: 2015-12-04 | Disposition: A | Discharge status: Home / Self Care | Payer: Medicaid Other | Source: Ambulatory Visit | Attending: Oncology | Admitting: Oncology

## 2015-12-04 ENCOUNTER — Encounter (HOSPITAL_COMMUNITY): Payer: Medicaid Other | Attending: Hematology & Oncology

## 2015-12-04 ENCOUNTER — Other Ambulatory Visit: Payer: Self-pay | Admitting: General Surgery

## 2015-12-04 DIAGNOSIS — K5900 Constipation, unspecified: Secondary | ICD-10-CM | POA: Insufficient documentation

## 2015-12-04 DIAGNOSIS — K7689 Other specified diseases of liver: Secondary | ICD-10-CM | POA: Insufficient documentation

## 2015-12-04 DIAGNOSIS — Z8611 Personal history of tuberculosis: Secondary | ICD-10-CM | POA: Diagnosis present

## 2015-12-04 DIAGNOSIS — C258 Malignant neoplasm of overlapping sites of pancreas: Secondary | ICD-10-CM | POA: Diagnosis present

## 2015-12-04 DIAGNOSIS — K869 Disease of pancreas, unspecified: Secondary | ICD-10-CM | POA: Diagnosis present

## 2015-12-04 DIAGNOSIS — R143 Flatulence: Secondary | ICD-10-CM | POA: Diagnosis present

## 2015-12-04 DIAGNOSIS — C251 Malignant neoplasm of body of pancreas: Secondary | ICD-10-CM | POA: Diagnosis present

## 2015-12-04 DIAGNOSIS — R918 Other nonspecific abnormal finding of lung field: Secondary | ICD-10-CM | POA: Insufficient documentation

## 2015-12-04 DIAGNOSIS — K769 Liver disease, unspecified: Secondary | ICD-10-CM

## 2015-12-04 DIAGNOSIS — E876 Hypokalemia: Secondary | ICD-10-CM | POA: Insufficient documentation

## 2015-12-04 DIAGNOSIS — K8689 Other specified diseases of pancreas: Secondary | ICD-10-CM

## 2015-12-04 LAB — COMPREHENSIVE METABOLIC PANEL
ALBUMIN: 3.8 g/dL (ref 3.5–5.0)
ALT: 12 U/L — ABNORMAL LOW (ref 17–63)
AST: 20 U/L (ref 15–41)
Alkaline Phosphatase: 73 U/L (ref 38–126)
Anion gap: 7 (ref 5–15)
BUN: 7 mg/dL (ref 6–20)
CHLORIDE: 91 mmol/L — AB (ref 101–111)
CO2: 28 mmol/L (ref 22–32)
Calcium: 8.8 mg/dL — ABNORMAL LOW (ref 8.9–10.3)
Creatinine, Ser: 0.39 mg/dL — ABNORMAL LOW (ref 0.61–1.24)
GFR calc Af Amer: >60 mL/min (ref >60)
GFR calc non Af Amer: >60 mL/min (ref >60)
GLUCOSE: 113 mg/dL — AB (ref 65–99)
POTASSIUM: 3.4 mmol/L — AB (ref 3.5–5.1)
SODIUM: 126 mmol/L — AB (ref 135–145)
Total Bilirubin: 0.4 mg/dL (ref 0.3–1.2)
Total Protein: 7.3 g/dL (ref 6.5–8.1)

## 2015-12-04 LAB — CBC WITH DIFFERENTIAL/PLATELET
BASOS ABS: 0 10*3/uL (ref 0.0–0.1)
Basophils Relative: 0 %
EOS PCT: 2 %
Eosinophils Absolute: 0.1 10*3/uL (ref 0.0–0.7)
HCT: 35.4 % — ABNORMAL LOW (ref 39.0–52.0)
Hemoglobin: 12.5 g/dL — ABNORMAL LOW (ref 13.0–17.0)
LYMPHS ABS: 1.2 10*3/uL (ref 0.7–4.0)
LYMPHS PCT: 14 %
MCH: 34 pg (ref 26.0–34.0)
MCHC: 35.3 g/dL (ref 30.0–36.0)
MCV: 96.2 fL (ref 78.0–100.0)
Monocytes Absolute: 0.9 10*3/uL (ref 0.1–1.0)
Monocytes Relative: 10 %
NEUTROS ABS: 6.6 10*3/uL (ref 1.7–7.7)
Neutrophils Relative %: 74 %
PLATELETS: 228 10*3/uL (ref 150–400)
RBC: 3.68 MIL/uL — AB (ref 4.22–5.81)
RDW: 12.2 % (ref 11.5–15.5)
WBC: 8.8 10*3/uL (ref 4.0–10.5)

## 2015-12-04 MED ORDER — IOPAMIDOL (ISOVUE-300) INJECTION 61%
75.0000 mL | Freq: Once | INTRAVENOUS | Status: AC | PRN
  Administered 2015-12-04: 75 mL via INTRAVENOUS

## 2015-12-05 ENCOUNTER — Encounter: Payer: Self-pay | Admitting: *Deleted

## 2015-12-05 LAB — ACID FAST SMEAR (AFB, MYCOBACTERIA): Acid Fast Smear: NEGATIVE

## 2015-12-05 LAB — CANCER ANTIGEN 19-9: CA 19-9: 1814 U/mL — ABNORMAL HIGH (ref 0–35)

## 2015-12-05 NOTE — Progress Notes (Signed)
[facility] Clinical Social Work  Clinical Social Work was referred by patient for assessment of psychosocial needs due to transportation assistance request. Clinical Social Worker contacted patient at home and left message explaining various resources to assist with transportation to medical appointment to offer support and assess for needs.  Pt has medicaid and can get [facility] to bring him to medical appointments at no cost.     Clinical Social Work interventions: Resource education  Loren Racer, [place] Tuesdays   [phone]

## 2015-12-08 ENCOUNTER — Other Ambulatory Visit (HOSPITAL_COMMUNITY): Payer: Self-pay | Admitting: Hematology & Oncology

## 2015-12-08 ENCOUNTER — Ambulatory Visit (HOSPITAL_COMMUNITY)
Admission: RE | Admit: 2015-12-08 | Discharge: 2015-12-08 | Disposition: A | Discharge status: Home / Self Care | Payer: Medicaid Other | Source: Ambulatory Visit | Attending: Hematology & Oncology | Admitting: Hematology & Oncology

## 2015-12-08 ENCOUNTER — Ambulatory Visit (HOSPITAL_COMMUNITY): Admission: RE | Admit: 2015-12-08 | Payer: Medicaid Other | Source: Ambulatory Visit

## 2015-12-08 ENCOUNTER — Encounter (HOSPITAL_COMMUNITY): Payer: Self-pay

## 2015-12-08 DIAGNOSIS — Z7982 Long term (current) use of aspirin: Secondary | ICD-10-CM | POA: Diagnosis not present

## 2015-12-08 DIAGNOSIS — K769 Liver disease, unspecified: Secondary | ICD-10-CM

## 2015-12-08 DIAGNOSIS — M8580 Other specified disorders of bone density and structure, unspecified site: Secondary | ICD-10-CM | POA: Diagnosis not present

## 2015-12-08 DIAGNOSIS — C259 Malignant neoplasm of pancreas, unspecified: Secondary | ICD-10-CM | POA: Insufficient documentation

## 2015-12-08 DIAGNOSIS — K8689 Other specified diseases of pancreas: Secondary | ICD-10-CM

## 2015-12-08 DIAGNOSIS — Z8611 Personal history of tuberculosis: Secondary | ICD-10-CM | POA: Diagnosis not present

## 2015-12-08 DIAGNOSIS — C251 Malignant neoplasm of body of pancreas: Secondary | ICD-10-CM

## 2015-12-08 DIAGNOSIS — F17210 Nicotine dependence, cigarettes, uncomplicated: Secondary | ICD-10-CM | POA: Diagnosis not present

## 2015-12-08 DIAGNOSIS — K760 Fatty (change of) liver, not elsewhere classified: Secondary | ICD-10-CM | POA: Diagnosis not present

## 2015-12-08 LAB — BASIC METABOLIC PANEL
ANION GAP: 9 (ref 5–15)
BUN: 9 mg/dL (ref 6–20)
CALCIUM: 9.1 mg/dL (ref 8.9–10.3)
CO2: 26 mmol/L (ref 22–32)
Chloride: 95 mmol/L — ABNORMAL LOW (ref 101–111)
Creatinine, Ser: 0.47 mg/dL — ABNORMAL LOW (ref 0.61–1.24)
Glucose, Bld: 147 mg/dL — ABNORMAL HIGH (ref 65–99)
Potassium: 3.2 mmol/L — ABNORMAL LOW (ref 3.5–5.1)
Sodium: 130 mmol/L — ABNORMAL LOW (ref 135–145)

## 2015-12-08 LAB — CBC WITH DIFFERENTIAL/PLATELET
BASOS ABS: 0 10*3/uL (ref 0.0–0.1)
BASOS PCT: 0 %
Eosinophils Absolute: 0.2 10*3/uL (ref 0.0–0.7)
Eosinophils Relative: 2 %
HEMATOCRIT: 36.4 % — AB (ref 39.0–52.0)
HEMOGLOBIN: 12.9 g/dL — AB (ref 13.0–17.0)
Lymphocytes Relative: 13 %
Lymphs Abs: 1.2 10*3/uL (ref 0.7–4.0)
MCH: 33.7 pg (ref 26.0–34.0)
MCHC: 35.4 g/dL (ref 30.0–36.0)
MCV: 95 fL (ref 78.0–100.0)
Monocytes Absolute: 0.9 10*3/uL (ref 0.1–1.0)
Monocytes Relative: 10 %
NEUTROS ABS: 7.1 10*3/uL (ref 1.7–7.7)
NEUTROS PCT: 75 %
Platelets: 218 10*3/uL (ref 150–400)
RBC: 3.83 MIL/uL — ABNORMAL LOW (ref 4.22–5.81)
RDW: 12.3 % (ref 11.5–15.5)
WBC: 9.4 10*3/uL (ref 4.0–10.5)

## 2015-12-08 LAB — PROTIME-INR
INR: 1.04 (ref 0.00–1.49)
Prothrombin Time: 13.4 seconds (ref 11.6–15.2)

## 2015-12-08 MED ORDER — LIDOCAINE-EPINEPHRINE (PF) 2 %-1:200000 IJ SOLN
INTRAMUSCULAR | Status: AC
  Filled 2015-12-08: qty 20

## 2015-12-08 MED ORDER — HEPARIN SOD (PORK) LOCK FLUSH 100 UNIT/ML IV SOLN
INTRAVENOUS | Status: AC | PRN
  Administered 2015-12-08: 500 [IU]

## 2015-12-08 MED ORDER — FENTANYL CITRATE (PF) 100 MCG/2ML IJ SOLN
INTRAMUSCULAR | Status: AC | PRN
  Administered 2015-12-08: 50 ug via INTRAVENOUS

## 2015-12-08 MED ORDER — LIDOCAINE-EPINEPHRINE (PF) 2 %-1:200000 IJ SOLN
INTRAMUSCULAR | Status: AC | PRN
  Administered 2015-12-08: 20 mL

## 2015-12-08 MED ORDER — HEPARIN SOD (PORK) LOCK FLUSH 100 UNIT/ML IV SOLN
INTRAVENOUS | Status: AC
  Filled 2015-12-08: qty 5

## 2015-12-08 MED ORDER — FENTANYL CITRATE (PF) 100 MCG/2ML IJ SOLN
INTRAMUSCULAR | Status: AC
  Filled 2015-12-08: qty 2

## 2015-12-08 MED ORDER — SODIUM CHLORIDE 0.9 % IV SOLN
INTRAVENOUS | Status: DC
  Administered 2015-12-08: 08:00:00 via INTRAVENOUS

## 2015-12-08 MED ORDER — MIDAZOLAM HCL 2 MG/2ML IJ SOLN
INTRAMUSCULAR | Status: AC | PRN
  Administered 2015-12-08: 0.5 mg via INTRAVENOUS
  Administered 2015-12-08: 1 mg via INTRAVENOUS
  Administered 2015-12-08: 0.5 mg via INTRAVENOUS

## 2015-12-08 MED ORDER — MIDAZOLAM HCL 2 MG/2ML IJ SOLN
INTRAMUSCULAR | Status: AC
  Filled 2015-12-08: qty 4

## 2015-12-08 MED ORDER — CEFAZOLIN SODIUM-DEXTROSE 2-4 GM/100ML-% IV SOLN
2.0000 g | INTRAVENOUS | Status: AC
  Administered 2015-12-08: 2 g via INTRAVENOUS
  Filled 2015-12-08: qty 100

## 2015-12-08 NOTE — Procedures (Signed)
Successful placement of right IJ approach port-a-cath with tip at the superior caval atrial junction. The catheter is ready for immediate use. Estimated Blood Loss: Minimal No immediate post procedural complications.  Jay Alvar Malinoski, MD Pager #: [phone]   

## 2015-12-08 NOTE — Consult Note (Signed)
Chief Complaint: Patient was seen in consultation today for Port-A-Cath placement  Referring Physician(s): Penland,Shannon K  Supervising Physician: Sandi Mariscal  Patient Status: Out-pt  History of Present Illness: Nicholas [name] is a 65 y.o. male with history of recently diagnosed pancreatic cancer who presents today for Port-A-Cath placement for chemotherapy.   Past Medical History  Diagnosis Date  . Abdominal pain in male     "was told he has a Mass near pancreas"  . Cancer (HCC)     Skin cancer-ear, skin cancer -leeg,cancer of leg muscle- left., basal cell   . Pancreatic mass april 2017  . TB (pulmonary tuberculosis)     3 yrs ago-tx" positive TB test aslso" was told he is not contagious.treatment done for regulat TB.    Past Surgical History  Procedure Laterality Date  . Leg surgery Left     " cancer removed from muscle"  . Colonoscopy w/ polypectomy    . Eus N/A 11/25/2015    Procedure: ESOPHAGEAL ENDOSCOPIC ULTRASOUND (EUS) RADIAL;  Surgeon: Milus Banister, MD;  Location: [facility];  Service: Endoscopy;  Laterality: N/A;    Allergies: Review of patient's allergies indicates no known allergies.  Medications: Prior to Admission medications   Medication Sig Start Date End Date Taking? Authorizing Provider  aspirin EC 81 MG tablet Take 81 mg by mouth daily.   Yes Historical Provider, MD  bisacodyl (DULCOLAX) 10 MG suppository Place 10 mg rectally as needed for moderate constipation.   Yes Historical Provider, MD  feeding supplement, ENSURE ENLIVE, (ENSURE ENLIVE) LIQD Take 237 mLs by mouth 2 (two) times daily between meals. 11/25/15  Yes Shanker Kristeen Mans, MD  hydrocerin (EUCERIN) CREA Apply 1 application topically 2 (two) times daily as needed (For dry skin.).   Yes Historical Provider, MD  Menthol-Methyl Salicylate (THERA-GESIC) 1-15 % CREA Apply 1 application topically 4 (four) times daily as needed (For pain.).   Yes Historical Provider, MD  Oxycodone  HCl 10 MG TABS Take 1 tablet (10 mg total) by mouth every 4 (four) hours as needed. 12/03/15  Yes Manon Hilding Kefalas, PA-C  polyethylene glycol (MIRALAX / GLYCOLAX) packet Take 17 g by mouth daily. Patient taking differently: Take 17 g by mouth daily as needed (For constipation.).  11/25/15  Yes Shanker Kristeen Mans, MD  Gemcitabine HCl (GEMZAR IV) Inject into the vein. To begin June 6    Historical Provider, MD  lidocaine-prilocaine (EMLA) cream Apply a quarter size amount to port site 1 hour prior to chemo. Do not rub in. Cover with plastic wrap. 12/02/15   Patrici Ranks, MD  Multiple Vitamins-Minerals (MULTIVITAMINS THER. W/MINERALS) TABS tablet Take 1 tablet by mouth daily.    Historical Provider, MD  ondansetron (ZOFRAN) 8 MG tablet Take 1 tablet (8 mg total) by mouth every 8 (eight) hours as needed for nausea or vomiting. 12/02/15   Patrici Ranks, MD  oxyCODONE (OXYCONTIN) 15 mg 12 hr tablet Take 1 tablet (15 mg total) by mouth every 12 (twelve) hours. 12/02/15   Patrici Ranks, MD  PACLitaxel Protein-Bound Part (ABRAXANE IV) Inject into the vein. To begin June 6    Historical Provider, MD  prochlorperazine (COMPAZINE) 10 MG tablet Take 1 tablet (10 mg total) by mouth every 6 (six) hours as needed for nausea or vomiting. 12/02/15   Patrici Ranks, MD     Family History  Problem Relation Age of Onset  . Pneumonia Father   . Stroke Mother  Social History   Social History  . Marital Status: Single    Spouse Name: N/A  . Number of Children: N/A  . Years of Education: N/A   Social History Main Topics  . Smoking status: Current Every Day Smoker -- 0.50 packs/day for 30 years    Types: Cigarettes  . Smokeless tobacco: Never Used  . Alcohol Use: 0.0 oz/week    0 Standard drinks or equivalent per week     Comment: 1-2 beers at night  . Drug Use: No  . Sexual Activity: No   Other Topics Concern  . None   Social History Narrative      Review of Systems  Constitutional:  Positive for unexpected weight change. Negative for fever and chills.  Respiratory: Positive for cough and shortness of breath.   Cardiovascular: Negative for chest pain.  Gastrointestinal: Positive for abdominal pain. Negative for nausea, vomiting and blood in stool.  Genitourinary: Negative for dysuria and hematuria.  Musculoskeletal: Positive for back pain.  Neurological: Negative for headaches.    Vital Signs: Blood pressure 141/71, heart rate 89, temperature 98.4, respirations 18, O2 sat 100% room air    Physical Exam  Constitutional: He is oriented to person, place, and time.  Thin WM c/o abd cramping  Cardiovascular: Normal rate and regular rhythm.   Pulmonary/Chest: Effort normal.  Distant BS with few insp wheezes  Abdominal: Soft. Bowel sounds are normal. There is tenderness.  Musculoskeletal: Normal range of motion. He exhibits no edema.  Neurological: He is alert and oriented to person, place, and time.    Mallampati Score:     Imaging: Ct Chest W Contrast  12/04/2015  CLINICAL DATA:  History of chronic cough. New diagnosis of pancreatic cancer. EXAM: CT CHEST WITH CONTRAST TECHNIQUE: Multidetector CT imaging of the chest was performed during intravenous contrast administration. CONTRAST:  33mL ISOVUE-300 IOPAMIDOL (ISOVUE-300) INJECTION 61% COMPARISON:  CT abdomen 11/22/2015, CT thorax 03/23/2012 FINDINGS: Mediastinum/Nodes: No axillary or supraclavicular adenopathy. No mediastinal hilar lymphadenopathy. No pericardial fluid. Esophagus is normal. Lungs/Pleura: In comparison to CT exam of 03/23/2012 there is improvement in the left lower lobe peribronchial nodular thickening as well as endobronchial opacification. Peripheral cystic / cavitary change persists in the LEFT lower lobe. One cavitary lesion measuring 20 mm (image 57, series 3) at site of previously seen 3.5 cm thin-walled cavitary lesion. Smaller cavitary focus measuring 1.2 cm image 70, series 3 is new from 1.2 cm  nodule of same site. New nodule along the oblique fissure measuring 8 mm in the RIGHT lung (image 78, series 3. On comparison exam there are multiple irregular nodules in the RIGHT upper lobe similar to the LEFT lower lobe. There is mild residual nodularity in the RIGHT upper lobe remaining (image 70, series 3. Upper abdomen: Large pancreatic mass again demonstrated and partially imaged measuring up to 7.7 cm. Musculoskeletal: Degenerative changes of the spine without aggressive osseous lesion. IMPRESSION: 1. No convincing evidence of pulmonary metastasis. 2. Improvement in LEFT lower lobe and RIGHT upper lobe angular nodular thickening seen on comparison CT from 2013. Residual cavitation remains at the LEFT lung base. 3. One nodule along the oblique fissure on the RIGHT measuring 8 mm is new from prior. Electronically Signed   By: Suzy Bouchard M.D.   On: 12/04/2015 15:32   Ct Abdomen Pelvis W Contrast  11/22/2015  CLINICAL DATA:  65 year old male with abdominal pain. EXAM: CT ABDOMEN AND PELVIS WITH CONTRAST TECHNIQUE: Multidetector CT imaging of the abdomen and pelvis  was performed using the standard protocol following bolus administration of intravenous contrast. CONTRAST:  111mL ISOVUE-300 IOPAMIDOL (ISOVUE-300) INJECTION 61% COMPARISON:  CT dated 11/11/2015 FINDINGS: There is emphysematous changes of the lung bases. Focal area of scarring and calcification noted at the left lung base. No intra-abdominal free air. There is diffuse mesenteric stranding and edema as well as small ascites, increased compared to the most recent study of 11/11/2015. There are two subcentimeter hypodense densities in the right lobe of the liver inferiorly adjacent the gallbladder which are incompletely characterized. Metastatic disease is not excluded. MRI without and with contrast may provide better characterisation. A faint subcentimeter hypodensity is also noted in the left lobe of the liver is superiorly (series 2, image  12). There is mild irregularity of the hepatic contour. The gallbladder appears unremarkable. The head of the pancreas appears unremarkable. There is a large hypo enhancing lesion in the body of the pancreas measuring approximately 4.3 x 4.0 cm grossly similar to prior study. Difference in measurement between the 2 studies is likely related to measurement technique. There is atrophy of the distal gland with dilatation of the duct. This lesion most likely represents a primary pancreatic malignancy. The spleen, adrenal glands, kidneys, visualized ureters, and urinary bladder appear unremarkable. The prostate and seminal vesicles are grossly unremarkable. There is abutment of the distal duodenum at the ligament of Treitz by the pancreatic mass. There is no evidence of bowel obstruction or active inflammation. Moderate stool noted throughout the colon. The appendix appears unremarkable. 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 Focal area of calcified atherosclerotic plaque inferior to the renal arteries with moderate narrowing of the lumen of the aorta (series 8, image 17). The origins of the celiac axis, SMA, and IMA appear patent. There is abutment of the proximal aspect of the celiac artery as well as the SMA by the pancreatic mass without definite evidence of encasement. There is encasement of the splenic artery. There is compression of the porta splenic confluence by the pancreatic mass as well as narrowing of the central SMV. The SMV, and main portal vein however remain patent. No the splenic vein is not visualized and appears to be occluded by the mass. No portal venous gas identified. Multiple small upper abdominal and mesenteric lymph nodes noted. A nodular density posterior to the pancreatic mass and anterior to the left renal vein noted measuring 7 mm in short axis (series 2, image 27). There is osteopenia with degenerative changes of the spine. No acute fracture. Probable small bone island and  in the left acetabulum anteriorly IMPRESSION: Diffuse mesenteric edema and small ascites with interval increase in the size of the free fluid compared to prior study. Large hypo enhancing pancreatic mass as described most compatible with primary pancreatic neoplasm. There is abutment of the celiac axis, SMA and encasement of the splenic artery. The mass occludes the splenic vein and compresses the porta splenic confluence Abutment of the distal duodenum at the ligament of Treitz peripancreatic mass. No bowel obstruction. Faint subcentimeter hepatic hypodense lesions, not characterized. Metastatic disease is not excluded. Further evaluation with MRI without and with contrast recommended. Electronically Signed   By: Anner Crete M.D.   On: 11/22/2015 03:56   Ct Abdomen Pelvis W Contrast  11/12/2015  CLINICAL DATA:  Intermittent abdominal pain for 3 weeks, worse today. EXAM: CT ABDOMEN AND PELVIS WITH CONTRAST TECHNIQUE: Multidetector CT imaging of the abdomen and pelvis was performed using the standard protocol following bolus administration of intravenous contrast. CONTRAST:  112mL ISOVUE-300 IOPAMIDOL (ISOVUE-300) INJECTION 61% COMPARISON:  None. FINDINGS: There is an irregular hypodense 4.7 cm mass involving the body of the pancreas just to the left of midline. 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 It encases the splenic artery and has a large zone of contact with the celiac axis. There also is contact with the superior mesenteric artery although it is not encased. There is deformity and narrowing of the confluence of the superior mesenteric vein and portal vein, with narrowing of the superior mesenteric vein. This is seen to best advantage on coronal image 27 series 3. There may also be involvement of the distal duodenum at the ligament of Treitz, suggested on sagittal image 60 series 4 and coronal image 29 series 3. There is chronic ductal  dilatation and atrophy of the pancreatic tail. The pancreatic head is normal in appearance. No hepatic metastases are evident. There is mild generalized fatty infiltration of the liver. Multiple small nodes are present in the upper abdomen. There are normal appearances of the adrenals and kidneys. Abdominal aorta is normal in caliber and heavily calcified. Bowel is unremarkable. There is no significant abnormality in the lower chest. There is no significant skeletal lesion. IMPRESSION: 4.7 cm mass of the pancreas just to the left of midline, likely a primary malignancy. Involvement of several vascular structures, also possibly the distal duodenum. These results were called by telephone at the time of interpretation on 11/12/2015 at 12:19 am to Dr. Lily Kocher , who verbally acknowledged these results. Electronically Signed   By: Andreas Newport M.D.   On: 11/12/2015 00:30   Dg Abd Acute W/chest  12/03/2015  CLINICAL DATA:  Acute onset of generalized abdominal pain. Initial encounter. EXAM: DG ABDOMEN ACUTE W/ 1V CHEST COMPARISON:  Chest radiograph performed 12/25/2014, and CT of the abdomen and pelvis from 11/22/2015 FINDINGS: The lungs are well-aerated. Left apical scarring and multiple nodular opacities are seen scattered about both lungs, significantly improved at the left lung apex and perhaps mildly worsened at the left midlung zone. This is compatible with the patient's history of tuberculosis. Would correlate with the patient's symptoms to help exclude acute tuberculosis. There is no evidence of pleural effusion or pneumothorax. The cardiomediastinal silhouette is within normal limits. The visualized bowel gas pattern is unremarkable. Scattered stool and air are seen within the colon; there is no evidence of small bowel dilatation to suggest obstruction. No free intra-abdominal air is identified on the provided upright view. No acute osseous abnormalities are seen; the sacroiliac joints are unremarkable  in appearance. Chronic right-sided rib deformities are noted. IMPRESSION: 1. Unremarkable bowel gas pattern; no free intra-abdominal air seen. Small to moderate amount of stool noted in the colon. 2. Left apical scarring and multiple nodular opacities about both lungs, significantly improved at the left lung apex and perhaps mildly worse at the left midlung zone, in comparison to 2016. This is compatible with the patient's history of tuberculosis. Would correlate with the patient's symptoms to help exclude acute tuberculosis. Electronically Signed   By: Garald Balding M.D.   On: 12/03/2015 02:39    Labs:  CBC:  Recent Labs  11/22/15 0003 12/03/15 0108 12/04/15 1507 12/08/15 0800  WBC 7.6 11.1* 8.8 9.4  HGB 12.5* 14.1 12.5* 12.9*  HCT 35.4* 40.3 35.4* 36.4*  PLT 240 285 228 218    COAGS:  Recent Labs  11/12/15 0513 12/08/15 0800  INR 1.06 1.04  APTT 27  --  BMP:  Recent Labs  11/23/15 0740 11/24/15 0401 12/03/15 0108 12/04/15 1507  NA 131* 131* 129* 126*  K 4.0 3.9 3.4* 3.4*  CL 99* 98* 91* 91*  CO2 28 27 29 28   GLUCOSE 114* 122* 120* 113*  BUN <5* 7 8 7   CALCIUM 8.9 8.7* 9.3 8.8*  CREATININE 0.44* 0.63 0.45* 0.39*  GFRNONAA >60 >60 >60 >60  GFRAA >60 >60 >60 >60    LIVER FUNCTION TESTS:  Recent Labs  11/12/15 0513 11/22/15 0003 12/03/15 0108 12/04/15 1507  BILITOT 0.6 0.3 0.2* 0.4  AST 21 18 20 20   ALT 15* 10* 12* 12*  ALKPHOS 62 61 82 73  PROT 7.0 7.1 8.2* 7.3  ALBUMIN 3.6 3.6 4.2 3.8    TUMOR MARKERS:  Recent Labs  11/12/15 0513 12/04/15 1507  CA199 1576* 1814*    Assessment and Plan: 65 y.o. male with history of recently diagnosed pancreatic cancer who presents today for Port-A-Cath placement for chemotherapy.Risks and benefits discussed with the patient including, but not limited to bleeding, infection, pneumothorax, or fibrin sheath development and need for additional procedures.All of the patient's questions were answered, patient is  agreeable to proceed.Consent signed and in chart.     Thank you for this interesting consult.  I greatly enjoyed meeting STYLIANOS HAYES and look forward to participating in their care.  A copy of this report was sent to the requesting provider on this date.  Electronically Signed: D. Rowe Robert 12/08/2015, 9:03 AM   I spent a total of 20 minutes   in face to face in clinical consultation, greater than 50% of which was counseling/coordinating care for port a cath placement

## 2015-12-08 NOTE — Sedation Documentation (Signed)
Patient denies pain and is resting comfortably.  

## 2015-12-08 NOTE — Discharge Instructions (Signed)
Moderate Conscious Sedation, Adult, Care After °Refer to this sheet in the next few weeks. These instructions provide you with information on caring for yourself after your procedure. Your health care provider may also give you more specific instructions. Your treatment has been planned according to current medical practices, but problems sometimes occur. Call your health care provider if you have any problems or questions after your procedure. °WHAT TO EXPECT AFTER THE PROCEDURE  °After your procedure: °· You may feel sleepy, clumsy, and have poor balance for several hours. °· Vomiting may occur if you eat too soon after the procedure. °HOME CARE INSTRUCTIONS °· Do not participate in any activities where you could become injured for at least 24 hours. Do not: °¨ Drive. °¨ Swim. °¨ Ride a bicycle. °¨ Operate heavy machinery. °¨ Cook. °¨ Use power tools. °¨ Climb ladders. °¨ Work from a high place. °· Do not make important decisions or sign legal documents until you are improved. °· If you vomit, drink water, juice, or soup when you can drink without vomiting. Make sure you have little or no nausea before eating solid foods. °· Only take over-the-counter or prescription medicines for pain, discomfort, or fever as directed by your health care provider. °· Make sure you and your family fully understand everything about the medicines given to you, including what side effects may occur. °· You should not drink alcohol, take sleeping pills, or take medicines that cause drowsiness for at least 24 hours. °· If you smoke, do not smoke without supervision. °· If you are feeling better, you may resume normal activities 24 hours after you were sedated. °· Keep all appointments with your health care provider. °SEEK MEDICAL CARE IF: °· Your skin is pale or bluish in color. °· You continue to feel nauseous or vomit. °· Your pain is getting worse and is not helped by medicine. °· You have bleeding or swelling. °· You are still  sleepy or feeling clumsy after 24 hours. °SEEK IMMEDIATE MEDICAL CARE IF: °· You develop a rash. °· You have difficulty breathing. °· You develop any type of allergic problem. °· You have a fever. °MAKE SURE YOU: °· Understand these instructions. °· Will watch your condition. °· Will get help right away if you are not doing well or get worse. °  °This information is not intended to replace advice given to you by your health care provider. Make sure you discuss any questions you have with your health care provider. °  °Document Released: 04/11/2013 Document Revised: 07/12/2014 Document Reviewed: 04/11/2013 °Elsevier Interactive Patient Education ©2016 Elsevier Inc. °Implanted Port Insertion, Care After °Refer to this sheet in the next few weeks. These instructions provide you with information on caring for yourself after your procedure. Your health care provider may also give you more specific instructions. Your treatment has been planned according to current medical practices, but problems sometimes occur. Call your health care provider if you have any problems or questions after your procedure. °WHAT TO EXPECT AFTER THE PROCEDURE °After your procedure, it is typical to have the following:  °· Discomfort at the port insertion site. Ice packs to the area will help. °· Bruising on the skin over the port. This will subside in 3-4 days. °HOME CARE INSTRUCTIONS °· After your port is placed, you will get a manufacturer's information card. The card has information about your port. Keep this card with you at all times.   °· Know what kind of port you have. There are many types   of ports available.   °· Wear a medical alert bracelet in case of an emergency. This can help alert health care workers that you have a port.   °· The port can stay in for as long as your health care provider believes it is necessary.   °· A home health care nurse may give medicines and take care of the port.   °· You or a family member can get  special training and directions for giving medicine and taking care of the port at home.   °SEEK MEDICAL CARE IF:  °· Your port does not flush or you are unable to get a blood return.   °· You have a fever or chills. °SEEK IMMEDIATE MEDICAL CARE IF: °· You have new fluid or pus coming from your incision.   °· You notice a bad smell coming from your incision site.   °· You have swelling, pain, or more redness at the incision or port site.   °· You have chest pain or shortness of breath. °  °This information is not intended to replace advice given to you by your health care provider. Make sure you discuss any questions you have with your health care provider. °  °Document Released: 04/11/2013 Document Revised: 06/26/2013 Document Reviewed: 04/11/2013 °Elsevier Interactive Patient Education ©2016 Elsevier Inc. °Implanted Port Home Guide °An implanted port is a type of central line that is placed under the skin. Central lines are used to provide IV access when treatment or nutrition needs to be given through a person's veins. Implanted ports are used for long-term IV access. An implanted port may be placed because:  °· You need IV medicine that would be irritating to the small veins in your hands or arms.   °· You need long-term IV medicines, such as antibiotics.   °· You need IV nutrition for a long period.   °· You need frequent blood draws for lab tests.   °· You need dialysis.   °Implanted ports are usually placed in the chest area, but they can also be placed in the upper arm, the abdomen, or the leg. An implanted port has two main parts:  °· Reservoir. The reservoir is round and will appear as a small, raised area under your skin. The reservoir is the part where a needle is inserted to give medicines or draw blood.   °· Catheter. The catheter is a thin, flexible tube that extends from the reservoir. The catheter is placed into a large vein. Medicine that is inserted into the reservoir goes into the catheter and  then into the vein.   °HOW WILL I CARE FOR MY INCISION SITE? °Do not get the incision site wet. Bathe or shower as directed by your health care provider.  °HOW IS MY PORT ACCESSED? °Special steps must be taken to access the port:  °· Before the port is accessed, a numbing cream can be placed on the skin. This helps numb the skin over the port site.   °· Your health care provider uses a sterile technique to access the port. °· Your health care provider must put on a mask and sterile gloves. °· The skin over your port is cleaned carefully with an antiseptic and allowed to dry. °· The port is gently pinched between sterile gloves, and a needle is inserted into the port. °· Only "non-coring" port needles should be used to access the port. Once the port is accessed, a blood return should be checked. This helps ensure that the port is in the vein and is not clogged.   °· If your port   needs to remain accessed for a constant infusion, a clear (transparent) bandage will be placed over the needle site. The bandage and needle will need to be changed every week, or as directed by your health care provider.   °· Keep the bandage covering the needle clean and dry. Do not get it wet. Follow your health care provider's instructions on how to take a shower or bath while the port is accessed.   °· If your port does not need to stay accessed, no bandage is needed over the port.   °WHAT IS FLUSHING? °Flushing helps keep the port from getting clogged. Follow your health care provider's instructions on how and when to flush the port. Ports are usually flushed with saline solution or a medicine called heparin. The need for flushing will depend on how the port is used.  °· If the port is used for intermittent medicines or blood draws, the port will need to be flushed:   °· After medicines have been given.   °· After blood has been drawn.   °· As part of routine maintenance.   °· If a constant infusion is running, the port may not need to  be flushed.   °HOW LONG WILL MY PORT STAY IMPLANTED? °The port can stay in for as long as your health care provider thinks it is needed. When it is time for the port to come out, surgery will be done to remove it. The procedure is similar to the one performed when the port was put in.  °WHEN SHOULD I SEEK IMMEDIATE MEDICAL CARE? °When you have an implanted port, you should seek immediate medical care if:  °· You notice a bad smell coming from the incision site.   °· You have swelling, redness, or drainage at the incision site.   °· You have more swelling or pain at the port site or the surrounding area.   °· You have a fever that is not controlled with medicine. °  °This information is not intended to replace advice given to you by your health care provider. Make sure you discuss any questions you have with your health care provider. °  °Document Released: 06/21/2005 Document Revised: 04/11/2013 Document Reviewed: 02/26/2013 °Elsevier Interactive Patient Education ©2016 Elsevier Inc. ° °

## 2015-12-09 ENCOUNTER — Encounter (HOSPITAL_BASED_OUTPATIENT_CLINIC_OR_DEPARTMENT_OTHER): Payer: Medicaid Other

## 2015-12-09 ENCOUNTER — Encounter (HOSPITAL_COMMUNITY): Payer: Self-pay | Admitting: Hematology & Oncology

## 2015-12-09 ENCOUNTER — Encounter (HOSPITAL_BASED_OUTPATIENT_CLINIC_OR_DEPARTMENT_OTHER): Payer: Medicaid Other | Admitting: Hematology & Oncology

## 2015-12-09 VITALS — BP 140/84 | HR 108 | Temp 98.1°F | Resp 16 | Wt 118.6 lb

## 2015-12-09 VITALS — BP 158/89 | HR 82 | Temp 97.8°F | Resp 18

## 2015-12-09 DIAGNOSIS — C258 Malignant neoplasm of overlapping sites of pancreas: Secondary | ICD-10-CM | POA: Diagnosis present

## 2015-12-09 DIAGNOSIS — G893 Neoplasm related pain (acute) (chronic): Secondary | ICD-10-CM | POA: Diagnosis not present

## 2015-12-09 DIAGNOSIS — E876 Hypokalemia: Secondary | ICD-10-CM | POA: Diagnosis not present

## 2015-12-09 DIAGNOSIS — Z5111 Encounter for antineoplastic chemotherapy: Secondary | ICD-10-CM

## 2015-12-09 DIAGNOSIS — Z8611 Personal history of tuberculosis: Secondary | ICD-10-CM | POA: Diagnosis not present

## 2015-12-09 DIAGNOSIS — K869 Disease of pancreas, unspecified: Secondary | ICD-10-CM | POA: Diagnosis not present

## 2015-12-09 LAB — CBC WITH DIFFERENTIAL/PLATELET
BASOS PCT: 0 %
Basophils Absolute: 0 10*3/uL (ref 0.0–0.1)
EOS ABS: 0.2 10*3/uL (ref 0.0–0.7)
Eosinophils Relative: 2 %
HCT: 36.1 % — ABNORMAL LOW (ref 39.0–52.0)
Hemoglobin: 12.1 g/dL — ABNORMAL LOW (ref 13.0–17.0)
Lymphocytes Relative: 15 %
Lymphs Abs: 1.5 10*3/uL (ref 0.7–4.0)
MCH: 32.7 pg (ref 26.0–34.0)
MCHC: 33.5 g/dL (ref 30.0–36.0)
MCV: 97.6 fL (ref 78.0–100.0)
MONO ABS: 0.8 10*3/uL (ref 0.1–1.0)
MONOS PCT: 8 %
NEUTROS PCT: 75 %
Neutro Abs: 7.5 10*3/uL (ref 1.7–7.7)
Platelets: 256 10*3/uL (ref 150–400)
RBC: 3.7 MIL/uL — ABNORMAL LOW (ref 4.22–5.81)
RDW: 12.5 % (ref 11.5–15.5)
WBC: 9.9 10*3/uL (ref 4.0–10.5)

## 2015-12-09 LAB — COMPREHENSIVE METABOLIC PANEL
ALBUMIN: 3.8 g/dL (ref 3.5–5.0)
ALT: 13 U/L — ABNORMAL LOW (ref 17–63)
ANION GAP: 9 (ref 5–15)
AST: 19 U/L (ref 15–41)
Alkaline Phosphatase: 68 U/L (ref 38–126)
BUN: 9 mg/dL (ref 6–20)
CHLORIDE: 96 mmol/L — AB (ref 101–111)
CO2: 26 mmol/L (ref 22–32)
Calcium: 8.8 mg/dL — ABNORMAL LOW (ref 8.9–10.3)
Creatinine, Ser: 0.55 mg/dL — ABNORMAL LOW (ref 0.61–1.24)
GFR calc Af Amer: >60 mL/min (ref >60)
GFR calc non Af Amer: >60 mL/min (ref >60)
GLUCOSE: 134 mg/dL — AB (ref 65–99)
POTASSIUM: 3.1 mmol/L — AB (ref 3.5–5.1)
SODIUM: 131 mmol/L — AB (ref 135–145)
Total Bilirubin: 0.2 mg/dL — ABNORMAL LOW (ref 0.3–1.2)
Total Protein: 7.3 g/dL (ref 6.5–8.1)

## 2015-12-09 MED ORDER — HEPARIN SOD (PORK) LOCK FLUSH 100 UNIT/ML IV SOLN
500.0000 [IU] | Freq: Once | INTRAVENOUS | Status: AC | PRN
  Administered 2015-12-09: 500 [IU]
  Filled 2015-12-09: qty 5

## 2015-12-09 MED ORDER — MORPHINE SULFATE (PF) 2 MG/ML IV SOLN
2.0000 mg | Freq: Once | INTRAVENOUS | Status: AC
Start: 2015-12-09 — End: 2015-12-09
  Administered 2015-12-09: 2 mg via INTRAVENOUS

## 2015-12-09 MED ORDER — SODIUM CHLORIDE 0.9 % IV SOLN
Freq: Once | INTRAVENOUS | Status: AC
  Administered 2015-12-09: 10:00:00 via INTRAVENOUS
  Filled 2015-12-09: qty 4

## 2015-12-09 MED ORDER — GEMCITABINE HCL CHEMO INJECTION 1 GM/26.3ML
1000.0000 mg/m2 | Freq: Once | INTRAVENOUS | Status: AC
  Administered 2015-12-09: 1634 mg via INTRAVENOUS
  Filled 2015-12-09: qty 42.98

## 2015-12-09 MED ORDER — MORPHINE SULFATE (PF) 2 MG/ML IV SOLN
INTRAVENOUS | Status: AC
  Filled 2015-12-09: qty 2

## 2015-12-09 MED ORDER — SODIUM CHLORIDE 0.9 % IV SOLN
Freq: Once | INTRAVENOUS | Status: AC
  Administered 2015-12-09: 10:00:00 via INTRAVENOUS

## 2015-12-09 MED ORDER — MORPHINE SULFATE 4 MG/ML IJ SOLN: 3.0000 mg | INTRAMUSCULAR | Status: DC | PRN

## 2015-12-09 MED ORDER — PACLITAXEL PROTEIN-BOUND CHEMO INJECTION 100 MG
125.0000 mg/m2 | Freq: Once | INTRAVENOUS | Status: AC
  Administered 2015-12-09: 200 mg via INTRAVENOUS
  Filled 2015-12-09: qty 40

## 2015-12-09 MED ORDER — MORPHINE SULFATE (PF) 2 MG/ML IV SOLN
3.0000 mg | INTRAVENOUS | Status: DC | PRN
  Administered 2015-12-09: 3 mg via INTRAVENOUS

## 2015-12-09 MED ORDER — MORPHINE SULFATE (PF) 2 MG/ML IV SOLN
INTRAVENOUS | Status: AC
  Filled 2015-12-09: qty 1

## 2015-12-09 MED ORDER — SODIUM CHLORIDE 0.9% FLUSH
10.0000 mL | INTRAVENOUS | Status: DC | PRN
  Administered 2015-12-09: 10 mL
  Filled 2015-12-09: qty 10

## 2015-12-09 NOTE — Patient Instructions (Signed)
[facility] Discharge Instructions for Patients Receiving Chemotherapy   Beginning January 23rd 2017 lab work for the [facility] will be done in the  Main lab at [facility] on 1st floor. If you have a lab appointment with the [facility] please come in thru the  Main Entrance and check in at the main information desk   Today you received the following chemotherapy agents:  Abraxane and Gemzar.  To help prevent nausea and vomiting after your treatment, we encourage you to take your nausea medication as prescribed.   If you develop nausea and vomiting, or diarrhea that is not controlled by your medication, call the clinic.  The clinic phone number is [phone]. Office hours are Monday-Friday 8:30am-5:00pm.  BELOW ARE SYMPTOMS THAT SHOULD BE REPORTED IMMEDIATELY:  *FEVER GREATER THAN 101.0 F  *CHILLS WITH OR WITHOUT FEVER  NAUSEA AND VOMITING THAT IS NOT CONTROLLED WITH YOUR NAUSEA MEDICATION  *UNUSUAL SHORTNESS OF BREATH  *UNUSUAL BRUISING OR BLEEDING  TENDERNESS IN MOUTH AND THROAT WITH OR WITHOUT PRESENCE OF ULCERS  *URINARY PROBLEMS  *BOWEL PROBLEMS  UNUSUAL RASH Items with * indicate a potential emergency and should be followed up as soon as possible. If you have an emergency after office hours please contact your primary care physician or go to the nearest emergency department.  Please call the clinic during office hours if you have any questions or concerns.   You may also contact the Patient Navigator at [phone] should you have any questions or need assistance in obtaining follow up care.      Resources For Cancer Patients and their Caregivers ? American Cancer Society: Can assist with transportation, wigs, general needs, runs Look Good Feel Better.        434-853-0032 ? Cancer Care: Provides financial assistance, online support groups, medication/co-pay assistance.  1-800-813-HOPE 867-095-6068) ? [place] Assists [place] cancer patients and their families through emotional , educational and financial support.  502-365-1302 ? [place] DSS Where to apply for food stamps, Medicaid and utility assistance. (302) 809-4119 ? [facility]: Transportation to medical appointments. 979-028-5398 ? Social Security Administration: May apply for disability if have a Stage IV cancer. 904-682-1406 (276)883-2458 ? [employer], Disability and Transit Services: Assists with nutrition, care and transit needs. [phone]        Gemcitabine injection What is this medicine? GEMCITABINE (jem SIT a been) is a chemotherapy drug. This medicine is used to treat many types of cancer like breast cancer, lung cancer, pancreatic cancer, and ovarian cancer. This medicine may be used for other purposes; ask your health care provider or pharmacist if you have questions. What should I tell my health care provider before I take this medicine? They need to know if you have any of these conditions: -blood disorders -infection -kidney disease -liver disease -recent or ongoing radiation therapy -an unusual or allergic reaction to gemcitabine, other chemotherapy, other medicines, foods, dyes, or preservatives -pregnant or trying to get pregnant -breast-feeding How should I use this medicine? This drug is given as an infusion into a vein. It is administered in a hospital or clinic by a specially trained health care professional. Talk to your pediatrician regarding the use of this medicine in children. Special care may be needed. Overdosage: If you think you have taken too much of this medicine contact a poison control center or emergency room at once. NOTE: This medicine is only for you. Do not share this medicine with others.  What if I miss a dose? It is important not to miss your dose. Call your doctor or health care professional if you are unable to keep an appointment. What may interact with this  medicine? -medicines to increase blood counts like filgrastim, pegfilgrastim, sargramostim -some other chemotherapy drugs like cisplatin -vaccines Talk to your doctor or health care professional before taking any of these medicines: -acetaminophen -aspirin -ibuprofen -ketoprofen -naproxen This list may not describe all possible interactions. Give your health care provider a list of all the medicines, herbs, non-prescription drugs, or dietary supplements you use. Also tell them if you smoke, drink alcohol, or use illegal drugs. Some items may interact with your medicine. What should I watch for while using this medicine? Visit your doctor for checks on your progress. This drug may make you feel generally unwell. This is not uncommon, as chemotherapy can affect healthy cells as well as cancer cells. Report any side effects. Continue your course of treatment even though you feel ill unless your doctor tells you to stop. In some cases, you may be given additional medicines to help with side effects. Follow all directions for their use. Call your doctor or health care professional for advice if you get a fever, chills or sore throat, or other symptoms of a cold or flu. Do not treat yourself. This drug decreases your body's ability to fight infections. Try to avoid being around people who are sick. This medicine may increase your risk to bruise or bleed. Call your doctor or health care professional if you notice any unusual bleeding. Be careful brushing and flossing your teeth or using a toothpick because you may get an infection or bleed more easily. If you have any dental work done, tell your dentist you are receiving this medicine. Avoid taking products that contain aspirin, acetaminophen, ibuprofen, naproxen, or ketoprofen unless instructed by your doctor. These medicines may hide a fever. Women should inform their doctor if they wish to become pregnant or think they might be pregnant. There is a  potential for serious side effects to an unborn child. Talk to your health care professional or pharmacist for more information. Do not breast-feed an infant while taking this medicine. What side effects may I notice from receiving this medicine? Side effects that you should report to your doctor or health care professional as soon as possible: -allergic reactions like skin rash, itching or hives, swelling of the face, lips, or tongue -low blood counts - this medicine may decrease the number of white blood cells, red blood cells and platelets. You may be at increased risk for infections and bleeding. -signs of infection - fever or chills, cough, sore throat, pain or difficulty passing urine -signs of decreased platelets or bleeding - bruising, pinpoint red spots on the skin, black, tarry stools, blood in the urine -signs of decreased red blood cells - unusually weak or tired, fainting spells, lightheadedness -breathing problems -chest pain -mouth sores -nausea and vomiting -pain, swelling, redness at site where injected -pain, tingling, numbness in the hands or feet -stomach pain -swelling of ankles, feet, hands -unusual bleeding Side effects that usually do not require medical attention (report to your doctor or health care professional if they continue or are bothersome): -constipation -diarrhea -hair loss -loss of appetite -stomach upset This list may not describe all possible side effects. Call your doctor for medical advice about side effects. You may report side effects to FDA at 1-800-FDA-1088. Where should I keep my medicine? This drug is given  in a hospital or clinic and will not be stored at home. NOTE: This sheet is a summary. It may not cover all possible information. If you have questions about this medicine, talk to your doctor, pharmacist, or health care provider.    2016, Elsevier/Gold Standard. (2007-10-31 18:45:54) Nanoparticle Albumin-Bound Paclitaxel injection What  is this medicine? NANOPARTICLE ALBUMIN-BOUND PACLITAXEL (Na no PAHR ti kuhl al BYOO muhn-bound PAK li TAX el) is a chemotherapy drug. It targets fast dividing cells, like cancer cells, and causes these cells to die. This medicine is used to treat advanced breast cancer and advanced lung cancer. This medicine may be used for other purposes; ask your health care provider or pharmacist if you have questions. What should I tell my health care provider before I take this medicine? They need to know if you have any of these conditions: -kidney disease -liver disease -low blood counts, like low platelets, red blood cells, or white blood cells -recent or ongoing radiation therapy -an unusual or allergic reaction to paclitaxel, albumin, other chemotherapy, other medicines, foods, dyes, or preservatives -pregnant or trying to get pregnant -breast-feeding How should I use this medicine? This drug is given as an infusion into a vein. It is administered in a hospital or clinic by a specially trained health care professional. Talk to your pediatrician regarding the use of this medicine in children. Special care may be needed. Overdosage: If you think you have taken too much of this medicine contact a poison control center or emergency room at once. NOTE: This medicine is only for you. Do not share this medicine with others. What if I miss a dose? It is important not to miss your dose. Call your doctor or health care professional if you are unable to keep an appointment. What may interact with this medicine? -cyclosporine -diazepam -ketoconazole -medicines to increase blood counts like filgrastim, pegfilgrastim, sargramostim -other chemotherapy drugs like cisplatin, doxorubicin, epirubicin, etoposide, teniposide, vincristine -quinidine -testosterone -vaccines -verapamil Talk to your doctor or health care professional before taking any of these  medicines: -acetaminophen -aspirin -ibuprofen -ketoprofen -naproxen This list may not describe all possible interactions. Give your health care provider a list of all the medicines, herbs, non-prescription drugs, or dietary supplements you use. Also tell them if you smoke, drink alcohol, or use illegal drugs. Some items may interact with your medicine. What should I watch for while using this medicine? Your condition will be monitored carefully while you are receiving this medicine. You will need important blood work done while you are taking this medicine. This drug may make you feel generally unwell. This is not uncommon, as chemotherapy can affect healthy cells as well as cancer cells. Report any side effects. Continue your course of treatment even though you feel ill unless your doctor tells you to stop. In some cases, you may be given additional medicines to help with side effects. Follow all directions for their use. Call your doctor or health care professional for advice if you get a fever, chills or sore throat, or other symptoms of a cold or flu. Do not treat yourself. This drug decreases your body's ability to fight infections. Try to avoid being around people who are sick. This medicine may increase your risk to bruise or bleed. Call your doctor or health care professional if you notice any unusual bleeding. Be careful brushing and flossing your teeth or using a toothpick because you may get an infection or bleed more easily. If you have any dental work done,  tell your dentist you are receiving this medicine. Avoid taking products that contain aspirin, acetaminophen, ibuprofen, naproxen, or ketoprofen unless instructed by your doctor. These medicines may hide a fever. Do not become pregnant while taking this medicine. Women should inform their doctor if they wish to become pregnant or think they might be pregnant. There is a potential for serious side effects to an unborn child. Talk to  your health care professional or pharmacist for more information. Do not breast-feed an infant while taking this medicine. Men are advised not to father a child while receiving this medicine. What side effects may I notice from receiving this medicine? Side effects that you should report to your doctor or health care professional as soon as possible: -allergic reactions like skin rash, itching or hives, swelling of the face, lips, or tongue -low blood counts - This drug may decrease the number of white blood cells, red blood cells and platelets. You may be at increased risk for infections and bleeding. -signs of infection - fever or chills, cough, sore throat, pain or difficulty passing urine -signs of decreased platelets or bleeding - bruising, pinpoint red spots on the skin, black, tarry stools, nosebleeds -signs of decreased red blood cells - unusually weak or tired, fainting spells, lightheadedness -breathing problems -changes in vision -chest pain -high or low blood pressure -mouth sores -nausea and vomiting -pain, swelling, redness or irritation at the injection site -pain, tingling, numbness in the hands or feet -slow or irregular heartbeat -swelling of the ankle, feet, hands Side effects that usually do not require medical attention (report to your doctor or health care professional if they continue or are bothersome): -aches, pains -changes in the color of fingernails -diarrhea -hair loss -loss of appetite This list may not describe all possible side effects. Call your doctor for medical advice about side effects. You may report side effects to FDA at 1-800-FDA-1088. Where should I keep my medicine? This drug is given in a hospital or clinic and will not be stored at home. NOTE: This sheet is a summary. It may not cover all possible information. If you have questions about this medicine, talk to your doctor, pharmacist, or health care provider.    2016, Elsevier/Gold Standard.  (2012-08-14 16:48:50)

## 2015-12-09 NOTE — Progress Notes (Signed)
[facility]  Progress Note  Patient Care Team: Provider Not In System as PCP - General  CHIEF COMPLAINTS/PURPOSE OF CONSULTATION:  Adenocarcinoma of body/tail of pancreas, T4N0Mx History of TB, treated by [facility] History of MAC Weight Loss History Hyponatremia Tobacco Abuse    Pancreatic cancer ([place])   11/11/2015 - 11/13/2015 Hospital Admission abdominal pain, cramping, diarrhea, weight loss, hyponatremia CT pancreatic body mass   11/12/2015 Imaging 4.7 cm mass of pancreas to L of midline, involvement of several vascular structures also possibly the distal duodenum   11/21/2015 - 11/25/2015 Hospital Admission EUS on 5/23, hyponatremia   11/22/2015 Imaging Diffuse mesenteric edema, large hypo enhancing pancreatic mass, abutment of celiac axis, SMA, encasement of splenic artery, occludes splenic vein, compresses porta splenic confluence   11/25/2015 Procedure EUS Dr. Ardis Hughs, irreg mass in pancreatic body/tail. 4.2 cm, involvement of major blood vessels not appreciated due to size of mass   11/25/2015 Pathology Results malignant cells c/w adenocarcinoma   12/04/2015 Miscellaneous Acid Fast Smear negative   12/04/2015 Imaging No convincing evidence of pulm mets, improvement in LLL, RUL angular nodular thickening seen on comparison CT from 2013, residual cavitation remains at the L lung base, one nodule oblique fissue on R 58mm new   12/08/2015 Procedure Port a cath   12/09/2015 -  Chemotherapy Gemzar/Abraxane     HISTORY OF PRESENTING ILLNESS:  Nicholas [name] 65 y.o. male is here for additional follow-up of locally advanced pancreatic cancer. Not felt to be a surgical candidate.   Nicholas [name] is unaccompanied. The patient did not attend his scheduled abdominal MRI. He is here for Cycle #1 Abraxane / Gemcitabine.  He rescheduled his MRI due to abdominal pain and lack of sleep. Friday night he got no sleep. On Saturday, his sister brought him a suppository. His  abdominal pain has "been so bad". He figured the cramps were from being constipated. He had not had a bowel movement for two days. He was able to have a good bowel movement after the suppository which relieved the abdominal pain, allowing him to sleep. He began to feel abdominal pain again yesterday and used another suppository last night. He had another good bowel movement but this time it did not resolve the abdominal pain.   He reports abdominal pain continues today. Reports he becomes "swimmy-headed" when he first stands up.  The patient has considered taking Unisom or melatonin to help himself fall asleep. He is concerned whether he will be able to discontinue his pain medications after his chemotherapy starts to work. He notes that he does not want to be on pain medication chronically.  He denies any other questions about chemotherapy. He is anxious to start. No changes from his last visit. He understands that the majority of his abdominal pain may be malignancy related.  No vomiting, no fever.  MEDICAL HISTORY:  Past Medical History  Diagnosis Date  . Abdominal pain in male     "was told he has a Mass near pancreas"  . Cancer (HCC)     Skin cancer-ear, skin cancer -leeg,cancer of leg muscle- left., basal cell   . Pancreatic mass april 2017  . TB (pulmonary tuberculosis)     3 yrs ago-tx" positive TB test aslso" was told he is not contagious.treatment done for regulat TB.    SURGICAL HISTORY: Past Surgical History  Procedure Laterality Date  . Leg surgery Left     " cancer removed from muscle"  .  Colonoscopy w/ polypectomy    . Eus N/A 11/25/2015    Procedure: ESOPHAGEAL ENDOSCOPIC ULTRASOUND (EUS) RADIAL;  Surgeon: Milus Banister, MD;  Location: [facility];  Service: Endoscopy;  Laterality: N/A;    SOCIAL HISTORY: Social History   Social History  . Marital Status: Single    Spouse Name: N/A  . Number of Children: N/A  . Years of Education: N/A   Occupational History    . Not on file.   Social History Main Topics  . Smoking status: Current Every Day Smoker -- 0.50 packs/day for 30 years    Types: Cigarettes  . Smokeless tobacco: Never Used  . Alcohol Use: No     Comment: 1-2 beers at night - pt states he can't remember the last time he drank any alcohol  . Drug Use: No  . Sexual Activity: No   Other Topics Concern  . Not on file   Social History Narrative   Single. He has been engaged 4 times. No children He has cut his smoking back since he had tuberculosis. He has smoked more with recent pain. Sometimes 1 ppd, "it varies". Since last Thursday, he has had 2 beers. It does not taste right. He has never had acid reflux before, but now he does. He was in the [employer]. Worked in Office manager, [place], and the post office.   FAMILY HISTORY: Family History  Problem Relation Age of Onset  . Pneumonia Father   . Stroke Mother    Mother living 12 years old at [employer] [facility] and rehabilitation center.  Father deceased at 59 years old of pneumonia. He drank and drank a lot. [employer] English as a second language teacher. Had a massive heart attack at 49 yo 1 sister, lives in [place]:  has No Known Allergies.  MEDICATIONS:  Current Outpatient Prescriptions  Medication Sig Dispense Refill  . aspirin EC 81 MG tablet Take 81 mg by mouth daily.    . bisacodyl (DULCOLAX) 10 MG suppository Place 10 mg rectally as needed for moderate constipation.    . feeding supplement, ENSURE ENLIVE, (ENSURE ENLIVE) LIQD Take 237 mLs by mouth 2 (two) times daily between meals. 60 Bottle 0  . hydrocerin (EUCERIN) CREA Apply 1 application topically 2 (two) times daily as needed (For dry skin.).    Marland Kitchen Menthol-Methyl Salicylate (THERA-GESIC) 1-15 % CREA Apply 1 application topically 4 (four) times daily as needed (For pain.).    Marland Kitchen oxyCODONE (OXYCONTIN) 15 mg 12 hr tablet Take 1 tablet (15 mg total) by mouth every 12 (twelve) hours. 60 tablet 0  . Oxycodone HCl 10 MG TABS Take 1  tablet (10 mg total) by mouth every 4 (four) hours as needed. 90 tablet 0  . polyethylene glycol (MIRALAX / GLYCOLAX) packet Take 17 g by mouth daily. (Patient taking differently: Take 17 g by mouth daily as needed (For constipation.). ) 14 each 0  . prochlorperazine (COMPAZINE) 10 MG tablet Take 1 tablet (10 mg total) by mouth every 6 (six) hours as needed for nausea or vomiting. 30 tablet 2  . Gemcitabine HCl (GEMZAR IV) Inject into the vein. To begin June 6    . lidocaine-prilocaine (EMLA) cream Apply a quarter size amount to port site 1 hour prior to chemo. Do not rub in. Cover with plastic wrap. 30 g 3  . Multiple Vitamins-Minerals (MULTIVITAMINS THER. W/MINERALS) TABS tablet Take 1 tablet by mouth daily. Reported on 12/09/2015    . ondansetron (ZOFRAN) 8 MG tablet Take 1 tablet (  8 mg total) by mouth every 8 (eight) hours as needed for nausea or vomiting. (Patient not taking: Reported on 12/09/2015) 30 tablet 2  . PACLitaxel Protein-Bound Part (ABRAXANE IV) Inject into the vein. To begin June 6     No current facility-administered medications for this visit.    Review of Systems  Constitutional: Positive for weight loss (25 lbs weight loss).  HENT: Negative.   Eyes: Negative.   Respiratory: Positive for shortness of breath.        Chronic SOB   Cardiovascular: Negative.   Gastrointestinal: Positive for heartburn, abdominal pain and constipation.       Constipation secondary to pain medication. Severe abdominal pain and cramping.  Genitourinary: Negative.   Musculoskeletal: Negative.   Skin: Negative.   Neurological: Negative.   Endo/Heme/Allergies: Negative.   Psychiatric/Behavioral: The patient has insomnia. The patient is not nervous/anxious.        Insomnia secondary to abdominal pain/cramping.  All other systems reviewed and are negative.  14 point ROS was done and is otherwise as detailed above or in HPI   PHYSICAL EXAMINATION: ECOG PERFORMANCE STATUS: 2 - Symptomatic, <50%  confined to bed  Filed Vitals:   12/09/15 0919  BP: 140/84  Pulse: 108  Temp: 98.1 F (36.7 C)  Resp: 16   Filed Weights   12/09/15 0919  Weight: 118 lb 9.6 oz (53.797 kg)    Physical Exam  Constitutional: He is oriented to person, place, and time.  Thin, cachexic, pleasant and appropriate.  Cigarettes in upper pocket  HENT:  Head: Normocephalic and atraumatic.  Mouth/Throat: Oropharynx is clear and moist.  Eyes: Conjunctivae and EOM are normal. Pupils are equal, round, and reactive to light. Right eye exhibits no discharge. Left eye exhibits no discharge. No scleral icterus.  Neck: Normal range of motion. Neck supple. No JVD present. No tracheal deviation present. No thyromegaly present.  Cardiovascular: Normal rate and regular rhythm.   Murmur heard. Pulmonary/Chest: Effort normal. No stridor.  Coarse breath sounds bilaterally, cough  Abdominal: Soft. Bowel sounds are normal. He exhibits no distension and no mass. There is no tenderness. There is no rebound and no guarding.  Musculoskeletal: Normal range of motion. He exhibits no edema or tenderness.  Lymphadenopathy:    He has no cervical adenopathy.  Neurological: He is alert and oriented to person, place, and time. Gait normal.  Skin: Skin is warm and dry.  Psychiatric: Mood, memory, affect and judgment normal.  Nursing note and vitals reviewed.   LABORATORY DATA:  I have reviewed the data as listed Lab Results  Component Value Date   WBC 9.9 12/09/2015   HGB 12.1* 12/09/2015   HCT 36.1* 12/09/2015   MCV 97.6 12/09/2015   PLT 256 12/09/2015   CMP     Component Value Date/Time   NA 131* 12/09/2015 1700   K 3.1* 12/09/2015 1700   CL 96* 12/09/2015 1700   CO2 26 12/09/2015 1700   GLUCOSE 134* 12/09/2015 1700   BUN 9 12/09/2015 1700   CREATININE 0.55* 12/09/2015 1700   CALCIUM 8.8* 12/09/2015 1700   PROT 7.3 12/09/2015 1700   ALBUMIN 3.8 12/09/2015 1700   AST 19 12/09/2015 1700   ALT 13* 12/09/2015 1700    ALKPHOS 68 12/09/2015 1700   BILITOT 0.2* 12/09/2015 1700   GFRNONAA >60 12/09/2015 1700   GFRAA >60 12/09/2015 1700   Results for OMANI, LANTON (MRN [id number]) as of 12/02/2015 08:20  Ref. Range 11/12/2015 05:13  CA 19-9  Latest Ref Range: 0-35 U/mL 1576 (H)   Results for RHYDER, PEVEY (MRN [id number]) as of 12/10/2015 17:31  Ref. Range 12/04/2015 15:07  CA 19-9 Latest Ref Range: 0-35 U/mL 1814 (H)    PATHOLOGY:     RADIOGRAPHIC STUDIES: I have personally reviewed the radiological images as listed and agreed with the findings in the report.  Study Result     CLINICAL DATA: History of chronic cough. New diagnosis of pancreatic cancer.  EXAM: CT CHEST WITH CONTRAST  TECHNIQUE: Multidetector CT imaging of the chest was performed during intravenous contrast administration.  CONTRAST: 36mL ISOVUE-300 IOPAMIDOL (ISOVUE-300) INJECTION 61%  COMPARISON: CT abdomen 11/22/2015, CT thorax 03/23/2012  FINDINGS: Mediastinum/Nodes: No axillary or supraclavicular adenopathy. No mediastinal hilar lymphadenopathy. No pericardial fluid. Esophagus is normal.  Lungs/Pleura: In comparison to CT exam of 03/23/2012 there is improvement in the left lower lobe peribronchial nodular thickening as well as endobronchial opacification. Peripheral cystic / cavitary change persists in the LEFT lower lobe. One cavitary lesion measuring 20 mm (image 57, series 3) at site of previously seen 3.5 cm thin-walled cavitary lesion. Smaller cavitary focus measuring 1.2 cm image 70, series 3 is new from 1.2 cm nodule of same site.  New nodule along the oblique fissure measuring 8 mm in the RIGHT lung (image 78, series 3.  On comparison exam there are multiple irregular nodules in the RIGHT upper lobe similar to the LEFT lower lobe. There is mild residual nodularity in the RIGHT upper lobe remaining (image 70, series 3.  Upper abdomen: Large pancreatic mass again demonstrated  and partially imaged measuring up to 7.7 cm.  Musculoskeletal: Degenerative changes of the spine without aggressive osseous lesion.  IMPRESSION: 1. No convincing evidence of pulmonary metastasis. 2. Improvement in LEFT lower lobe and RIGHT upper lobe angular nodular thickening seen on comparison CT from 2013. Residual cavitation remains at the LEFT lung base. 3. One nodule along the oblique fissure on the RIGHT measuring 8 mm is new from prior.   Electronically Signed  By: Suzy Bouchard M.D.  On: 12/04/2015 15:32    EUS:        ASSESSMENT & PLAN:  Adenocarcinoma of body/tail of pancreas, T4N0Mx History of TB, treated by [facility] History of MAC Weight Loss History Hyponatremia Tobacco Abuse Cancer related pain Hypokalemia  We reviewed his CT imaging of the chest. He repeated sputum cultures which thus far are negative. He is here today to start cycle #1 of gemzar/abraxane. He has rescheduled his MRI for next week, I advised him that it is fine.  His pain is secondary to his malignancy and I advised him that hopefully with therapy his pain will improve, his appetite will also improve. He is to return next week for his next treatment and to assess tolerance. In the interim he was encouraged to call us with problems or concerns.   He has hypokalemia, KDur will need to be started. Hyponatremia his chronic and will be monitored.   He is picking up his oxycontin today as he needed a prior [place]. He was given a constipation sheet. Long acting and short acting pain medications were again reviewed with the patient.  Discussed his imaging with radiology, although no direct evidence of vascular invasion on imaging the tumor abuts the major vessels and it is highly likely there is invasion. Also discussed with Dr. Ardis Hughs who feels patient has unresectable disease. He could not observe directly on EUS secondary to tumor size. MRI is still  pending.  MEDICATIONS  PRESCRIBED THIS ENCOUNTER: Meds ordered this encounter  Medications  . DISCONTD: morphine 4 MG/ML injection 3 mg    Sig:    All questions were answered. The patient knows to call the clinic with any problems, questions or concerns.  This document serves as a record of services personally performed by Ancil Linsey, MD. It was created on her behalf by Arlyce Harman, a trained medical scribe. The creation of this record is based on the scribe's personal observations and the provider's statements to them. This document has been checked and approved by the attending provider.  I have reviewed the above documentation for accuracy and completeness, and I agree with the above.  This note was electronically signed.    Molli Hazard, MD  12/10/2015 5:28 PM

## 2015-12-09 NOTE — Addendum Note (Signed)
Addended by: Joie Bimler on: 12/09/2015 05:03 PM   Modules accepted: Orders

## 2015-12-09 NOTE — Patient Instructions (Signed)
[facility] at [facility] Discharge Instructions  RECOMMENDATIONS MADE BY THE CONSULTANT AND ANY TEST RESULTS WILL BE SENT TO YOUR REFERRING PHYSICIAN.  Return to clinic in 1 week for labs & chemo (same day through port) as scheduled  Your Oxycontin is ready to be picked up @ [facility]  Your 2 nausea medications and your numbing cream are at [facility]. You just need to request them if you want them.        Thank you for choosing [place] at [facility] to provide your oncology and hematology care.  To afford each patient quality time with our provider, please arrive at least 15 minutes before your scheduled appointment time.   Beginning January 23rd 2017 lab work for the [facility] will be done in the  Main lab at [facility] on 1st floor. If you have a lab appointment with the [facility] please come in thru the  Main Entrance and check in at the main information desk  You need to re-schedule your appointment should you arrive 10 or more minutes late.  We strive to give you quality time with our providers, and arriving late affects you and other patients whose appointments are after yours.  Also, if you no show three or more times for appointments you may be dismissed from the clinic at the providers discretion.     Again, thank you for choosing [facility].  Our hope is that these requests will decrease the amount of time that you wait before being seen by our physicians.       _____________________________________________________________  Should you have questions after your visit to [facility], please contact our office at [phone] between the hours of 8:30 a.m. and 4:30 p.m.  Voicemails left after 4:30 p.m. will not be returned until the following business day.  For prescription refill requests, have your pharmacy contact our office.         Resources For Cancer Patients and their  Caregivers ? American Cancer Society: Can assist with transportation, wigs, general needs, runs Look Good Feel Better.        5738705081 ? Cancer Care: Provides financial assistance, online support groups, medication/co-pay assistance.  1-800-813-HOPE 636 595 7577) ? [facility] Assists [place] cancer patients and their families through emotional , educational and financial support.  251-023-8618 ? [place] DSS Where to apply for food stamps, Medicaid and utility assistance. 947-593-5733 ? [facility]: Transportation to medical appointments. (843) 594-4952 ? Social Security Administration: May apply for disability if have a Stage IV cancer. (534)383-2199 340-832-5850 ? [employer], Disability and Transit Services: Assists with nutrition, care and transit needs. [facility] Support Programs: @10RELATIVEDAYS @ > Cancer Support Group  2nd Tuesday of the month 1pm-2pm, Journey Room  > Creative Journey  3rd Tuesday of the month 1130am-1pm, Journey Room  > Look Good Feel Better  1st Wednesday of the month 10am-12 noon, Journey Room (Call Welcome to register [phone])

## 2015-12-09 NOTE — Progress Notes (Signed)
1205:  Tolerated tx w/o adverse reaction; VSS; a&Ox4, in no distress.  Denies pain.  Discharged ambulatory, gait steady.

## 2015-12-10 ENCOUNTER — Encounter (HOSPITAL_COMMUNITY): Payer: Self-pay | Admitting: *Deleted

## 2015-12-10 ENCOUNTER — Encounter (HOSPITAL_COMMUNITY): Payer: Self-pay | Admitting: Hematology & Oncology

## 2015-12-10 NOTE — Progress Notes (Signed)
Contacted pt for 24 hour f/u post chemotherapy - message left to call the clinic with any questions or concerns.

## 2015-12-11 ENCOUNTER — Other Ambulatory Visit (HOSPITAL_COMMUNITY): Payer: Self-pay | Admitting: Oncology

## 2015-12-11 ENCOUNTER — Other Ambulatory Visit (HOSPITAL_COMMUNITY): Payer: Self-pay | Admitting: Radiology

## 2015-12-11 ENCOUNTER — Ambulatory Visit (HOSPITAL_COMMUNITY)
Admission: RE | Admit: 2015-12-11 | Discharge: 2015-12-11 | Disposition: A | Discharge status: Home / Self Care | Payer: Medicaid Other | Source: Ambulatory Visit | Attending: Gastroenterology | Admitting: Gastroenterology

## 2015-12-11 ENCOUNTER — Ambulatory Visit (HOSPITAL_COMMUNITY)
Admission: RE | Admit: 2015-12-11 | Disposition: A | Discharge status: Home / Self Care | Payer: Medicaid Other | Source: Ambulatory Visit | Admitting: Gastroenterology

## 2015-12-11 DIAGNOSIS — K869 Disease of pancreas, unspecified: Secondary | ICD-10-CM | POA: Diagnosis not present

## 2015-12-11 DIAGNOSIS — K8689 Other specified diseases of pancreas: Secondary | ICD-10-CM

## 2015-12-11 DIAGNOSIS — Z8507 Personal history of malignant neoplasm of pancreas: Secondary | ICD-10-CM

## 2015-12-11 HISTORY — DX: Other specified diseases of pancreas: K86.89

## 2015-12-11 SURGERY — UPPER ENDOSCOPIC ULTRASOUND (EUS) LINEAR
Anesthesia: Monitor Anesthesia Care

## 2015-12-11 MED ORDER — GADOBENATE DIMEGLUMINE 529 MG/ML IV SOLN: 10.0000 mL | Freq: Once | INTRAVENOUS | Status: DC | PRN

## 2015-12-15 ENCOUNTER — Other Ambulatory Visit (HOSPITAL_COMMUNITY): Payer: Self-pay | Admitting: Oncology

## 2015-12-16 ENCOUNTER — Encounter (HOSPITAL_COMMUNITY): Payer: Self-pay | Admitting: Oncology

## 2015-12-16 ENCOUNTER — Encounter (HOSPITAL_BASED_OUTPATIENT_CLINIC_OR_DEPARTMENT_OTHER): Payer: Medicaid Other

## 2015-12-16 ENCOUNTER — Encounter (HOSPITAL_BASED_OUTPATIENT_CLINIC_OR_DEPARTMENT_OTHER): Payer: Medicaid Other | Admitting: Oncology

## 2015-12-16 VITALS — BP 157/87 | HR 82 | Temp 97.9°F | Resp 18

## 2015-12-16 VITALS — BP 122/76 | HR 112 | Resp 16 | Wt 115.0 lb

## 2015-12-16 DIAGNOSIS — E876 Hypokalemia: Secondary | ICD-10-CM

## 2015-12-16 DIAGNOSIS — Z5111 Encounter for antineoplastic chemotherapy: Secondary | ICD-10-CM

## 2015-12-16 DIAGNOSIS — R143 Flatulence: Secondary | ICD-10-CM | POA: Diagnosis not present

## 2015-12-16 DIAGNOSIS — C258 Malignant neoplasm of overlapping sites of pancreas: Secondary | ICD-10-CM

## 2015-12-16 DIAGNOSIS — K869 Disease of pancreas, unspecified: Secondary | ICD-10-CM | POA: Diagnosis not present

## 2015-12-16 DIAGNOSIS — K5900 Constipation, unspecified: Secondary | ICD-10-CM | POA: Diagnosis not present

## 2015-12-16 LAB — CBC WITH DIFFERENTIAL/PLATELET
BASOS ABS: 0 10*3/uL (ref 0.0–0.1)
Basophils Relative: 1 %
EOS PCT: 3 %
Eosinophils Absolute: 0.2 10*3/uL (ref 0.0–0.7)
HEMATOCRIT: 36.3 % — AB (ref 39.0–52.0)
HEMOGLOBIN: 12.6 g/dL — AB (ref 13.0–17.0)
LYMPHS PCT: 26 %
Lymphs Abs: 1.4 10*3/uL (ref 0.7–4.0)
MCH: 33.3 pg (ref 26.0–34.0)
MCHC: 34.7 g/dL (ref 30.0–36.0)
MCV: 96 fL (ref 78.0–100.0)
Monocytes Absolute: 0.3 10*3/uL (ref 0.1–1.0)
Monocytes Relative: 6 %
NEUTROS ABS: 3.5 10*3/uL (ref 1.7–7.7)
NEUTROS PCT: 64 %
PLATELETS: 141 10*3/uL — AB (ref 150–400)
RBC: 3.78 MIL/uL — AB (ref 4.22–5.81)
RDW: 12.1 % (ref 11.5–15.5)
WBC: 5.4 10*3/uL (ref 4.0–10.5)

## 2015-12-16 LAB — COMPREHENSIVE METABOLIC PANEL
ALT: 17 U/L (ref 17–63)
AST: 19 U/L (ref 15–41)
Albumin: 3.6 g/dL (ref 3.5–5.0)
Alkaline Phosphatase: 66 U/L (ref 38–126)
Anion gap: 9 (ref 5–15)
BUN: 8 mg/dL (ref 6–20)
CHLORIDE: 99 mmol/L — AB (ref 101–111)
CO2: 25 mmol/L (ref 22–32)
CREATININE: 0.46 mg/dL — AB (ref 0.61–1.24)
Calcium: 9 mg/dL (ref 8.9–10.3)
GFR calc Af Amer: >60 mL/min (ref >60)
Glucose, Bld: 145 mg/dL — ABNORMAL HIGH (ref 65–99)
Potassium: 3.4 mmol/L — ABNORMAL LOW (ref 3.5–5.1)
Sodium: 133 mmol/L — ABNORMAL LOW (ref 135–145)
Total Bilirubin: 0.3 mg/dL (ref 0.3–1.2)
Total Protein: 7.2 g/dL (ref 6.5–8.1)

## 2015-12-16 MED ORDER — MAGNESIUM HYDROXIDE 400 MG/5ML PO SUSP
30.0000 mL | Freq: Once | ORAL | Status: AC
  Administered 2015-12-16: 30 mL via ORAL
  Filled 2015-12-16: qty 30

## 2015-12-16 MED ORDER — POTASSIUM CHLORIDE CRYS ER 20 MEQ PO TBCR
20.0000 meq | EXTENDED_RELEASE_TABLET | Freq: Once | ORAL | Status: AC
  Administered 2015-12-16: 20 meq via ORAL
  Filled 2015-12-16: qty 1

## 2015-12-16 MED ORDER — SODIUM CHLORIDE 0.9% FLUSH
10.0000 mL | INTRAVENOUS | Status: DC | PRN
  Administered 2015-12-16: 10 mL
  Filled 2015-12-16: qty 10

## 2015-12-16 MED ORDER — SODIUM CHLORIDE 0.9 % IV SOLN
1000.0000 mg/m2 | Freq: Once | INTRAVENOUS | Status: AC
  Administered 2015-12-16: 1634 mg via INTRAVENOUS
  Filled 2015-12-16: qty 42.98

## 2015-12-16 MED ORDER — PACLITAXEL PROTEIN-BOUND CHEMO INJECTION 100 MG
125.0000 mg/m2 | Freq: Once | INTRAVENOUS | Status: AC
  Administered 2015-12-16: 200 mg via INTRAVENOUS
  Filled 2015-12-16: qty 40

## 2015-12-16 MED ORDER — HEPARIN SOD (PORK) LOCK FLUSH 100 UNIT/ML IV SOLN
500.0000 [IU] | Freq: Once | INTRAVENOUS | Status: AC | PRN
  Administered 2015-12-16: 500 [IU]
  Filled 2015-12-16: qty 5

## 2015-12-16 MED ORDER — SIMETHICONE 80 MG PO CHEW
80.0000 mg | CHEWABLE_TABLET | Freq: Once | ORAL | Status: AC
  Administered 2015-12-16: 80 mg via ORAL
  Filled 2015-12-16: qty 1

## 2015-12-16 MED ORDER — SODIUM CHLORIDE 0.9 % IV SOLN
Freq: Once | INTRAVENOUS | Status: AC
  Administered 2015-12-16: 11:00:00 via INTRAVENOUS
  Filled 2015-12-16: qty 4

## 2015-12-16 MED ORDER — SODIUM CHLORIDE 0.9 % IV SOLN
Freq: Once | INTRAVENOUS | Status: AC
  Administered 2015-12-16: 10:00:00 via INTRAVENOUS

## 2015-12-16 NOTE — Progress Notes (Addendum)
PROVIDER NOT IN SYSTEM No address on file  Overlapping malignant neoplasm of pancreas ([facility])  Hypokalemia - Plan: DISCONTINUED: potassium chloride SA (K-DUR,KLOR-CON) CR tablet 20 mEq  Flatulence - Plan: DISCONTINUED: simethicone (MYLICON) chewable tablet 80 mg  Constipation, unspecified constipation type - Plan: DISCONTINUED: magnesium hydroxide (MILK OF MAGNESIA) suspension 30 mL  CURRENT THERAPY: Abraxane/Gemcitabine days 1, 8, and 15 every 28 days beginning on 12/09/2015.  INTERVAL HISTORY: Nicholas [name] 65 y.o. 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 AND History of TB, treated by [facility] and history of MAC.    Pancreatic cancer ([place])   11/11/2015 - 11/13/2015 Hospital Admission abdominal pain, cramping, diarrhea, weight loss, hyponatremia CT pancreatic body mass   11/12/2015 Imaging 4.7 cm mass of pancreas to L of midline, involvement of several vascular structures also possibly the distal duodenum   11/21/2015 - 11/25/2015 Hospital Admission EUS on 5/23, hyponatremia   11/22/2015 Imaging Diffuse mesenteric edema, large hypo enhancing pancreatic mass, abutment of celiac axis, SMA, encasement of splenic artery, occludes splenic vein, compresses porta splenic confluence   11/25/2015 Procedure EUS Dr. Ardis Hughs, irreg mass in pancreatic body/tail. 4.2 cm, involvement of major blood vessels not appreciated due to size of mass   11/25/2015 Pathology Results malignant cells c/w adenocarcinoma   12/04/2015 Miscellaneous Acid Fast Smear negative   12/04/2015 Imaging No convincing evidence of pulm mets, improvement in LLL, RUL angular nodular thickening seen on comparison CT from 2013, residual cavitation remains at the L lung base, one nodule oblique fissue on R 24m new   12/08/2015 Procedure Port a cath   12/09/2015 -  Chemotherapy Gemzar/Abraxane days 1, 8, and 15 every 28 days.   12/12/2015 Imaging MRI abd- Limited MRI, D/C prior to completion at patient request. Large infiltrative 8.1 x 4.7 x 5.8 cm pancreatic mass centered at the junction of the pancreatic body and tail, consistent with known primary pancreatic adenocarc   He has has a great week post-treatment except for yesterday when he started to experience some abdominal cramping.  He notes constipation associated with the abdominal cramping.  He notes passing of flatulence. Monday p.m. he did have bowel movements that were small and black/sticky/"gummy" in nature. He considered reporting to the emergency department, but fell asleep and upon awakening, symptoms resolved. He denies a history of this in the past. His hemoglobin is very stable today. On further questioning, he does admit to consuming a whole bottle of Pepto-Bismol for abdominal cramping prior to these bowel movements. This is likely the cause of his dark sticky stools.  Otherwise, he denies any complaints. He denies any nausea or vomiting. He tolerated treatment well. He notes that posttreatment every single day was great except for Monday as mentioned above.  Review of Systems  Constitutional: Positive for weight loss (Weight is down a few lbs.) and malaise/fatigue (Stable). Negative for fever and chills.  HENT: Negative.   Eyes: Negative.   Respiratory: Negative.   Cardiovascular: Negative.   Gastrointestinal: Positive for abdominal pain ("abdominal cramping") and constipation. Negative for nausea and vomiting.  Genitourinary: Negative.   Musculoskeletal: Negative.   Skin: Negative.   Neurological: Negative.   Endo/Heme/Allergies: Negative.   Psychiatric/Behavioral: Negative.     Past Medical History  Diagnosis Date  . Abdominal pain in male     "  was told he has a Mass near pancreas"  . Cancer ([facility])     Skin cancer-ear, skin cancer  -leeg,cancer of leg muscle- left., basal cell   . Pancreatic mass april 2017  . TB (pulmonary tuberculosis)     3 yrs ago-tx" positive TB test aslso" was told he is not contagious.treatment done for regulat TB.  Marland Kitchen Pancreatic cancer ([place]) 12/02/2015    Past Surgical History  Procedure Laterality Date  . Leg surgery Left     " cancer removed from muscle"  . Colonoscopy w/ polypectomy    . Eus N/A 11/25/2015    Procedure: ESOPHAGEAL ENDOSCOPIC ULTRASOUND (EUS) RADIAL;  Surgeon: Milus Banister, MD;  Location: [facility];  Service: Endoscopy;  Laterality: N/A;    Family History  Problem Relation Age of Onset  . Pneumonia Father   . Stroke Mother     Social History   Social History  . Marital Status: Single    Spouse Name: N/A  . Number of Children: N/A  . Years of Education: N/A   Social History Main Topics  . Smoking status: Current Every Day Smoker -- 0.50 packs/day for 30 years    Types: Cigarettes  . Smokeless tobacco: Never Used  . Alcohol Use: No     Comment: 1-2 beers at night - pt states he can't remember the last time he drank any alcohol  . Drug Use: No  . Sexual Activity: No   Other Topics Concern  . Not on file   Social History Narrative     PHYSICAL EXAMINATION  ECOG PERFORMANCE STATUS: 1 - Symptomatic but completely ambulatory  Filed Vitals:   12/16/15 0911  BP: 122/76  Pulse: 112  Resp: 16     GENERAL:alert, no distress, well nourished, well developed, cooperative, smiling and unaccompanied SKIN: skin color, texture, turgor are normal, no rashes or significant lesions HEAD: Normocephalic, No masses, lesions, tenderness or abnormalities EYES: normal, Conjunctiva are pink and non-injected EARS: External ears normal OROPHARYNX:lips, buccal mucosa, and tongue normal and mucous membranes are moist  NECK: supple, no adenopathy, trachea midline LYMPH:  no palpable lymphadenopathy BREAST:not examined LUNGS: clear to auscultation  HEART: regular  rate & rhythm, no murmurs, no gallops, S1 normal and S2 normal ABDOMEN:abdomen soft, non-tender, normal bowel sounds and no masses or organomegaly BACK: Back symmetric, no curvature., No CVA tenderness EXTREMITIES:less then 2 second capillary refill, no joint deformities, effusion, or inflammation, no skin discoloration, no cyanosis  NEURO: alert & oriented x 3 with fluent speech, gait normal   LABORATORY DATA: CBC    Component Value Date/Time   WBC 5.4 12/16/2015 0923   RBC 3.78* 12/16/2015 0923   HGB 12.6* 12/16/2015 0923   HCT 36.3* 12/16/2015 0923   PLT 141* 12/16/2015 0923   MCV 96.0 12/16/2015 0923   MCH 33.3 12/16/2015 0923   MCHC 34.7 12/16/2015 0923   RDW 12.1 12/16/2015 0923   LYMPHSABS 1.4 12/16/2015 0923   MONOABS 0.3 12/16/2015 0923   EOSABS 0.2 12/16/2015 0923   BASOSABS 0.0 12/16/2015 0923      Chemistry      Component Value Date/Time   NA 133* 12/16/2015 0923   K 3.4* 12/16/2015 0923   CL 99* 12/16/2015 0923   CO2 25 12/16/2015 0923   BUN 8 12/16/2015 0923   CREATININE 0.46* 12/16/2015 0923      Component Value Date/Time   CALCIUM 9.0 12/16/2015 0923   ALKPHOS 66 12/16/2015 0923   AST 19 12/16/2015 1308  ALT 17 12/16/2015 0923   BILITOT 0.3 12/16/2015 0923        PENDING LABS:   RADIOGRAPHIC STUDIES:  Ct Chest W Contrast  12/04/2015  CLINICAL DATA:  History of chronic cough. New diagnosis of pancreatic cancer. EXAM: CT CHEST WITH CONTRAST TECHNIQUE: Multidetector CT imaging of the chest was performed during intravenous contrast administration. CONTRAST:  78m ISOVUE-300 IOPAMIDOL (ISOVUE-300) INJECTION 61% COMPARISON:  CT abdomen 11/22/2015, CT thorax 03/23/2012 FINDINGS: Mediastinum/Nodes: No axillary or supraclavicular adenopathy. No mediastinal hilar lymphadenopathy. No pericardial fluid. Esophagus is normal. Lungs/Pleura: In comparison to CT exam of 03/23/2012 there is improvement in the left lower lobe peribronchial nodular thickening as well  as endobronchial opacification. Peripheral cystic / cavitary change persists in the LEFT lower lobe. One cavitary lesion measuring 20 mm (image 57, series 3) at site of previously seen 3.5 cm thin-walled cavitary lesion. Smaller cavitary focus measuring 1.2 cm image 70, series 3 is new from 1.2 cm nodule of same site. New nodule along the oblique fissure measuring 8 mm in the RIGHT lung (image 78, series 3. On comparison exam there are multiple irregular nodules in the RIGHT upper lobe similar to the LEFT lower lobe. There is mild residual nodularity in the RIGHT upper lobe remaining (image 70, series 3. Upper abdomen: Large pancreatic mass again demonstrated and partially imaged measuring up to 7.7 cm. Musculoskeletal: Degenerative changes of the spine without aggressive osseous lesion. IMPRESSION: 1. No convincing evidence of pulmonary metastasis. 2. Improvement in LEFT lower lobe and RIGHT upper lobe angular nodular thickening seen on comparison CT from 2013. Residual cavitation remains at the LEFT lung base. 3. One nodule along the oblique fissure on the RIGHT measuring 8 mm is new from prior. Electronically Signed   By: SSuzy BouchardM.D.   On: 12/04/2015 15:32   Ct Abdomen Pelvis W Contrast  11/22/2015  CLINICAL DATA:  65year old male with abdominal pain. EXAM: CT ABDOMEN AND PELVIS WITH CONTRAST TECHNIQUE: Multidetector CT imaging of the abdomen and pelvis was performed using the standard protocol following bolus administration of intravenous contrast. CONTRAST:  1056mISOVUE-300 IOPAMIDOL (ISOVUE-300) INJECTION 61% COMPARISON:  CT dated 11/11/2015 FINDINGS: There is emphysematous changes of the lung bases. Focal area of scarring and calcification noted at the left lung base. No intra-abdominal free air. There is diffuse mesenteric stranding and edema as well as small ascites, increased compared to the most recent study of 11/11/2015. There are two subcentimeter hypodense densities in the right lobe of  the liver inferiorly adjacent the gallbladder which are incompletely characterized. Metastatic disease is not excluded. MRI without and with contrast may provide better characterisation. A faint subcentimeter hypodensity is also noted in the left lobe of the liver is superiorly (series 2, image 12). There is mild irregularity of the hepatic contour. The gallbladder appears unremarkable. The head of the pancreas appears unremarkable. There is a large hypo enhancing lesion in the body of the pancreas measuring approximately 4.3 x 4.0 cm grossly similar to prior study. Difference in measurement between the 2 studies is likely related to measurement technique. There is atrophy of the distal gland with dilatation of the duct. This lesion most likely represents a primary pancreatic malignancy. The spleen, adrenal glands, kidneys, visualized ureters, and urinary bladder appear unremarkable. The prostate and seminal vesicles are grossly unremarkable. There is abutment of the distal duodenum at the ligament of Treitz by the pancreatic mass. There is no evidence of bowel obstruction or active inflammation. Moderate stool noted throughout the  colon. The appendix appears unremarkable. 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 Focal area of calcified atherosclerotic plaque inferior to the renal arteries with moderate narrowing of the lumen of the aorta (series 8, image 17). The origins of the celiac axis, SMA, and IMA appear patent. There is abutment of the proximal aspect of the celiac artery as well as the SMA by the pancreatic mass without definite evidence of encasement. There is encasement of the splenic artery. There is compression of the porta splenic confluence by the pancreatic mass as well as narrowing of the central SMV. The SMV, and main portal vein however remain patent. No the splenic vein is not visualized and appears to be occluded by the mass. No portal venous gas identified. Multiple small upper  abdominal and mesenteric lymph nodes noted. A nodular density posterior to the pancreatic mass and anterior to the left renal vein noted measuring 7 mm in short axis (series 2, image 27). There is osteopenia with degenerative changes of the spine. No acute fracture. Probable small bone island and in the left acetabulum anteriorly IMPRESSION: Diffuse mesenteric edema and small ascites with interval increase in the size of the free fluid compared to prior study. Large hypo enhancing pancreatic mass as described most compatible with primary pancreatic neoplasm. There is abutment of the celiac axis, SMA and encasement of the splenic artery. The mass occludes the splenic vein and compresses the porta splenic confluence Abutment of the distal duodenum at the ligament of Treitz peripancreatic mass. No bowel obstruction. Faint subcentimeter hepatic hypodense lesions, not characterized. Metastatic disease is not excluded. Further evaluation with MRI without and with contrast recommended. Electronically Signed   By: Anner Crete M.D.   On: 11/22/2015 03:56   Mr Abdomen Limited  12/12/2015  CLINICAL DATA:  Recent diagnosis of pancreatic adenocarcinoma on 11/25/2015. Low-attenuation lesions in the liver on recent CT. EXAM: MRI ABDOMEN LIMITED WITHOUT CONTRAST; MR 3D RECON AT SCANNER TECHNIQUE: Multiplanar, multisequence MR imaging was performed. No intravenous contrast was administered.; Multiplanar multisequence MR imaging of the abdomen was performed, including heavily T2-weighted images of the biliary and pancreatic ducts. Three-dimensional MR images were rendered by post processing of the original MR data. CONTRAST:  None. COMPARISON:  11/22/2015 CT abdomen/ pelvis. FINDINGS: This limited MRI study was discontinued at patient request. Only a limited number of sequences were obtained. Postcontrast imaging was not obtained. The MRCP sequence is significantly motion degraded. Lower chest: At least 3 scattered pulmonary  nodules are seen at the right lung base measuring up to 5 mm. Hepatobiliary: Normal liver size and configuration. Chemical shift imaging was not obtained; cannot assess for hepatic steatosis. There are numerous (greater than 15) T2 hyperintense liver masses scattered throughout the liver with signal intensity similar to the spleen. The largest T2 hyperintense liver masses measure 2.0 x 1.7 cm in segment 5 of the right liver lobe (series 4/ image 32), 1.7 x 1.3 cm in the far inferior segment 5 right liver lobe (series 4/ image 37) and 0.9 x 0.8 cm and segment 4B of the left liver lobe (series 4/ image 24). Normal gallbladder with no cholelithiasis. No biliary ductal dilatation. Common bile duct diameter 5 mm. No choledocholithiasis. No biliary strictures. Pancreas: There is a large infiltrative 8.1 x 4.7 x 5.8 cm pancreatic mass centered at the junction of the pancreatic body and tail, demonstrating mild heterogeneous T2 hyperintensity and restricted diffusion, consistent with primary pancreatic adenocarcinoma. The pancreatic mass replaces the entire pancreatic tail and much of the  pancreatic body. The celiac trunk, proximal common hepatic artery and proximal splenic artery are completely encased by tumor. Portions of the proximal superior mesenteric artery are completely encased by tumor. The splenic vein appears occluded by extrinsic tumor. The confluence of the superior mesenteric vein and main portal vein is narrowed by extrinsic tumor, which involves at least 50% of the circumference of the SMV - main portal vein confluence. There is mild dilatation of the ventral pancreatic duct in the pancreatic head (5 mm diameter). The main pancreatic duct in the pancreatic body and tail is obliterated by tumor. No convincing evidence of pancreas divisum. Spleen: Normal size. No mass. Adrenals/Urinary Tract: Normal right adrenal. Inferior left adrenal 2.0 x 1.7 cm mass (series 3/ image 19), incompletely evaluated on this  limited MRI. No hydronephrosis. Normal kidneys with no renal mass. Stomach/Bowel: Grossly normal stomach. Visualized small and large bowel is normal caliber, with no bowel wall thickening. Vascular/Lymphatic: Normal caliber abdominal aorta. There is a mildly enlarged 1.1 cm gastrohepatic ligament node (series 7/ image 55). There is a mildly enlarged 1.2 cm left paraceliac node (series 7/ image 61). Other: There is small volume perihepatic and perisplenic ascites. There are at least 3 soft tissue masses in the perihepatic space measuring up to 2.2 x 1.5 cm (series 3/ image 33), suspicious for peritoneal tumor implants. Musculoskeletal: No aggressive appearing focal osseous lesions. Mild levocurvature of the lumbar spine. Moderate degenerative disc disease throughout the visualized thoracolumbar spine. IMPRESSION: 1. Limited unenhanced MRI, discontinued prior to completion at patient request. 2. Large infiltrative 8.1 x 4.7 x 5.8 cm pancreatic mass centered at the junction of the pancreatic body and tail, consistent with known primary pancreatic adenocarcinoma. Extensive vascular encasement by the tumor as described. 3. Numerous (greater than 15) T2 hyperintense liver masses scattered throughout the liver, incompletely evaluated, suspicious for liver metastases. 4. Mild upper retroperitoneal lymphadenopathy, suspicious for nodal metastases. 5. Left adrenal mass, incompletely evaluated, suspicious for left adrenal metastasis. 6. New small volume perihepatic and perisplenic ascites. At least 3 soft tissue masses in the perihepatic space suspicious for peritoneal tumor implants. 7. Right lung base pulmonary nodules, correlate with 12/04/2015 chest CT report for further details. 8. No biliary ductal dilatation. Electronically Signed   By: Ilona Sorrel M.D.   On: 12/12/2015 07:39   Mr 3d Recon At Scanner  12/12/2015  CLINICAL DATA:  Recent diagnosis of pancreatic adenocarcinoma on 11/25/2015. Low-attenuation lesions in  the liver on recent CT. EXAM: MRI ABDOMEN LIMITED WITHOUT CONTRAST; MR 3D RECON AT SCANNER TECHNIQUE: Multiplanar, multisequence MR imaging was performed. No intravenous contrast was administered.; Multiplanar multisequence MR imaging of the abdomen was performed, including heavily T2-weighted images of the biliary and pancreatic ducts. Three-dimensional MR images were rendered by post processing of the original MR data. CONTRAST:  None. COMPARISON:  11/22/2015 CT abdomen/ pelvis. FINDINGS: This limited MRI study was discontinued at patient request. Only a limited number of sequences were obtained. Postcontrast imaging was not obtained. The MRCP sequence is significantly motion degraded. Lower chest: At least 3 scattered pulmonary nodules are seen at the right lung base measuring up to 5 mm. Hepatobiliary: Normal liver size and configuration. Chemical shift imaging was not obtained; cannot assess for hepatic steatosis. There are numerous (greater than 15) T2 hyperintense liver masses scattered throughout the liver with signal intensity similar to the spleen. The largest T2 hyperintense liver masses measure 2.0 x 1.7 cm in segment 5 of the right liver lobe (series 4/ image 32), 1.7  x 1.3 cm in the far inferior segment 5 right liver lobe (series 4/ image 37) and 0.9 x 0.8 cm and segment 4B of the left liver lobe (series 4/ image 24). Normal gallbladder with no cholelithiasis. No biliary ductal dilatation. Common bile duct diameter 5 mm. No choledocholithiasis. No biliary strictures. Pancreas: There is a large infiltrative 8.1 x 4.7 x 5.8 cm pancreatic mass centered at the junction of the pancreatic body and tail, demonstrating mild heterogeneous T2 hyperintensity and restricted diffusion, consistent with primary pancreatic adenocarcinoma. The pancreatic mass replaces the entire pancreatic tail and much of the pancreatic body. The celiac trunk, proximal common hepatic artery and proximal splenic artery are completely  encased by tumor. Portions of the proximal superior mesenteric artery are completely encased by tumor. The splenic vein appears occluded by extrinsic tumor. The confluence of the superior mesenteric vein and main portal vein is narrowed by extrinsic tumor, which involves at least 50% of the circumference of the SMV - main portal vein confluence. There is mild dilatation of the ventral pancreatic duct in the pancreatic head (5 mm diameter). The main pancreatic duct in the pancreatic body and tail is obliterated by tumor. No convincing evidence of pancreas divisum. Spleen: Normal size. No mass. Adrenals/Urinary Tract: Normal right adrenal. Inferior left adrenal 2.0 x 1.7 cm mass (series 3/ image 19), incompletely evaluated on this limited MRI. No hydronephrosis. Normal kidneys with no renal mass. Stomach/Bowel: Grossly normal stomach. Visualized small and large bowel is normal caliber, with no bowel wall thickening. Vascular/Lymphatic: Normal caliber abdominal aorta. There is a mildly enlarged 1.1 cm gastrohepatic ligament node (series 7/ image 55). There is a mildly enlarged 1.2 cm left paraceliac node (series 7/ image 61). Other: There is small volume perihepatic and perisplenic ascites. There are at least 3 soft tissue masses in the perihepatic space measuring up to 2.2 x 1.5 cm (series 3/ image 33), suspicious for peritoneal tumor implants. Musculoskeletal: No aggressive appearing focal osseous lesions. Mild levocurvature of the lumbar spine. Moderate degenerative disc disease throughout the visualized thoracolumbar spine. IMPRESSION: 1. Limited unenhanced MRI, discontinued prior to completion at patient request. 2. Large infiltrative 8.1 x 4.7 x 5.8 cm pancreatic mass centered at the junction of the pancreatic body and tail, consistent with known primary pancreatic adenocarcinoma. Extensive vascular encasement by the tumor as described. 3. Numerous (greater than 15) T2 hyperintense liver masses scattered  throughout the liver, incompletely evaluated, suspicious for liver metastases. 4. Mild upper retroperitoneal lymphadenopathy, suspicious for nodal metastases. 5. Left adrenal mass, incompletely evaluated, suspicious for left adrenal metastasis. 6. New small volume perihepatic and perisplenic ascites. At least 3 soft tissue masses in the perihepatic space suspicious for peritoneal tumor implants. 7. Right lung base pulmonary nodules, correlate with 12/04/2015 chest CT report for further details. 8. No biliary ductal dilatation. Electronically Signed   By: Ilona Sorrel M.D.   On: 12/12/2015 07:39   Ir Fluoro Guide Cv Line Right  12/08/2015  INDICATION: History of pancreatic cancer. In need of durable intravenous access for chemotherapy administration. EXAM: IMPLANTED PORT A CATH PLACEMENT WITH ULTRASOUND AND FLUOROSCOPIC GUIDANCE COMPARISON:  Chest CT - 12/04/2015 MEDICATIONS: Ancef 2 gm IV; The antibiotic was administered within an appropriate time interval prior to skin puncture. ANESTHESIA/SEDATION: Moderate (conscious) sedation was employed during this procedure. A total of Versed 2 mg and Fentanyl 50 mcg was administered intravenously. Moderate Sedation Time: 24 minutes. The patient's level of consciousness and vital signs were monitored continuously by radiology nursing throughout  the procedure under my direct supervision. CONTRAST:  None FLUOROSCOPY TIME:  42 seconds (2.7 MGy) COMPLICATIONS: None immediate. PROCEDURE: The procedure, risks, benefits, and alternatives were explained to the patient. Questions regarding the procedure were encouraged and answered. The patient understands and consents to the procedure. The right neck and chest were prepped with chlorhexidine in a sterile fashion, and a sterile drape was applied covering the operative field. Maximum barrier sterile technique with sterile gowns and gloves were used for the procedure. A timeout was performed prior to the initiation of the procedure.  Local anesthesia was provided with 1% lidocaine with epinephrine. After creating a small venotomy incision, a micropuncture kit was utilized to access the internal jugular vein under direct, real-time ultrasound guidance. Ultrasound image documentation was performed. The microwire was kinked to measure appropriate catheter length. A subcutaneous port pocket was then created along the upper chest wall utilizing a combination of sharp and blunt dissection. The pocket was irrigated with sterile saline. A single lumen thin power injectable port was chosen for placement. The 8 Fr catheter was tunneled from the port pocket site to the venotomy incision. The port was placed in the pocket. The external catheter was trimmed to appropriate length. At the venotomy, an 8 Fr peel-away sheath was placed over a guidewire under fluoroscopic guidance. The catheter was then placed through the sheath and the sheath was removed. Final catheter positioning was confirmed and documented with a fluoroscopic spot radiograph. The port was accessed with a Huber needle, aspirated and flushed with heparinized saline. The venotomy site was closed with an interrupted 4-0 Vicryl suture. The port pocket incision was closed with interrupted 2-0 Vicryl suture and the skin was opposed with a running subcuticular 4-0 Vicryl suture. Dermabond and Steri-strips were applied to both incisions. Dressings were placed. The patient tolerated the procedure well without immediate post procedural complication. FINDINGS: After catheter placement, the tip lies within the superior cavoatrial junction. The catheter aspirates and flushes normally and is ready for immediate use. IMPRESSION: Successful placement of a right internal jugular approach power injectable Port-A-Cath. The catheter is ready for immediate use. Electronically Signed   By: Sandi Mariscal M.D.   On: 12/08/2015 10:49   Ir US Guide Vasc Access Right  12/08/2015  INDICATION: History of pancreatic  cancer. In need of durable intravenous access for chemotherapy administration. EXAM: IMPLANTED PORT A CATH PLACEMENT WITH ULTRASOUND AND FLUOROSCOPIC GUIDANCE COMPARISON:  Chest CT - 12/04/2015 MEDICATIONS: Ancef 2 gm IV; The antibiotic was administered within an appropriate time interval prior to skin puncture. ANESTHESIA/SEDATION: Moderate (conscious) sedation was employed during this procedure. A total of Versed 2 mg and Fentanyl 50 mcg was administered intravenously. Moderate Sedation Time: 24 minutes. The patient's level of consciousness and vital signs were monitored continuously by radiology nursing throughout the procedure under my direct supervision. CONTRAST:  None FLUOROSCOPY TIME:  42 seconds (2.7 MGy) COMPLICATIONS: None immediate. PROCEDURE: The procedure, risks, benefits, and alternatives were explained to the patient. Questions regarding the procedure were encouraged and answered. The patient understands and consents to the procedure. The right neck and chest were prepped with chlorhexidine in a sterile fashion, and a sterile drape was applied covering the operative field. Maximum barrier sterile technique with sterile gowns and gloves were used for the procedure. A timeout was performed prior to the initiation of the procedure. Local anesthesia was provided with 1% lidocaine with epinephrine. After creating a small venotomy incision, a micropuncture kit was utilized to access the internal jugular  vein under direct, real-time ultrasound guidance. Ultrasound image documentation was performed. The microwire was kinked to measure appropriate catheter length. A subcutaneous port pocket was then created along the upper chest wall utilizing a combination of sharp and blunt dissection. The pocket was irrigated with sterile saline. A single lumen thin power injectable port was chosen for placement. The 8 Fr catheter was tunneled from the port pocket site to the venotomy incision. The port was placed in the  pocket. The external catheter was trimmed to appropriate length. At the venotomy, an 8 Fr peel-away sheath was placed over a guidewire under fluoroscopic guidance. The catheter was then placed through the sheath and the sheath was removed. Final catheter positioning was confirmed and documented with a fluoroscopic spot radiograph. The port was accessed with a Huber needle, aspirated and flushed with heparinized saline. The venotomy site was closed with an interrupted 4-0 Vicryl suture. The port pocket incision was closed with interrupted 2-0 Vicryl suture and the skin was opposed with a running subcuticular 4-0 Vicryl suture. Dermabond and Steri-strips were applied to both incisions. Dressings were placed. The patient tolerated the procedure well without immediate post procedural complication. FINDINGS: After catheter placement, the tip lies within the superior cavoatrial junction. The catheter aspirates and flushes normally and is ready for immediate use. IMPRESSION: Successful placement of a right internal jugular approach power injectable Port-A-Cath. The catheter is ready for immediate use. Electronically Signed   By: Sandi Mariscal M.D.   On: 12/08/2015 10:49   Dg Abd Acute W/chest  12/03/2015  CLINICAL DATA:  Acute onset of generalized abdominal pain. Initial encounter. EXAM: DG ABDOMEN ACUTE W/ 1V CHEST COMPARISON:  Chest radiograph performed 12/25/2014, and CT of the abdomen and pelvis from 11/22/2015 FINDINGS: The lungs are well-aerated. Left apical scarring and multiple nodular opacities are seen scattered about both lungs, significantly improved at the left lung apex and perhaps mildly worsened at the left midlung zone. This is compatible with the patient's history of tuberculosis. Would correlate with the patient's symptoms to help exclude acute tuberculosis. There is no evidence of pleural effusion or pneumothorax. The cardiomediastinal silhouette is within normal limits. The visualized bowel gas  pattern is unremarkable. Scattered stool and air are seen within the colon; there is no evidence of small bowel dilatation to suggest obstruction. No free intra-abdominal air is identified on the provided upright view. No acute osseous abnormalities are seen; the sacroiliac joints are unremarkable in appearance. Chronic right-sided rib deformities are noted. IMPRESSION: 1. Unremarkable bowel gas pattern; no free intra-abdominal air seen. Small to moderate amount of stool noted in the colon. 2. Left apical scarring and multiple nodular opacities about both lungs, significantly improved at the left lung apex and perhaps mildly worse at the left midlung zone, in comparison to 2016. This is compatible with the patient's history of tuberculosis. Would correlate with the patient's symptoms to help exclude acute tuberculosis. Electronically Signed   By: Garald Balding M.D.   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  Began systemic chemotherapy consisting of Abraxane/Gemzar days 1, 8, 15 every 28 days on 12/09/2015.  He also has a history of TB, treated by [facility] and history of MAC.  Oncology history is updated.  Staging in [facility]  problem list updated with most recent information (MRI results).  Pre-treatment labs today as ordered.  Labs satisfy treatment parameters.  Hypokalemia is noted.  20 mEq Kdur is prescribed for today in the clinic. HGB is stable.  He notes constipation yesterday.  He will be provided a constipation sheet.    He notes a recent 1 day history of dark/black/stickly/tarry/"gummy" stools (Monday).  He notes abdominal cramping Sunday into Monday and therefore he consumed an entire bottle of Pepto-Bismol.  This is likely the  cause of his dark stools.  We will order stool cards x 3 to verify and if he has a BM during clinic today, this can be tested.    I will order some simethicone today in the clinic for his flatulence and an order for MOM for constipation.  Return in 3 weeks for follow-up and the embark on cycle #2 of treatment knowing that he returns next week for day 15 of treatment.    ORDERS PLACED FOR THIS ENCOUNTER: No orders of the defined types were placed in this encounter.    MEDICATIONS PRESCRIBED THIS ENCOUNTER: No orders of the defined types were placed in this encounter.    THERAPY PLAN:  Continue with treatment as planned.  All questions were answered. The patient knows to call the clinic with any problems, questions or concerns. We can certainly see the patient much sooner if necessary.  Patient and plan discussed with Dr. Ancil Linsey and she is in agreement with the aforementioned.   This note is electronically signed by: Doy Mince 12/16/2015 1:43 PM

## 2015-12-16 NOTE — Progress Notes (Signed)
Tolerated tx w/o adverse reaction. A&Ox4; in no distress; VSS.  Discharged ambulatory.

## 2015-12-16 NOTE — Assessment & Plan Note (Addendum)
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  Began systemic chemotherapy consisting of Abraxane/Gemzar days 1, 8, 15 every 28 days on 12/09/2015.  He also has a history of TB, treated by [facility] and history of MAC.  Oncology history is updated.  Staging in [facility] problem list updated with most recent information (MRI results).  Pre-treatment labs today as ordered.  Labs satisfy treatment parameters.  Hypokalemia is noted.  20 mEq Kdur is prescribed for today in the clinic. HGB is stable.  He notes constipation yesterday.  He will be provided a constipation sheet.    He notes a recent 1 day history of dark/black/stickly/tarry/"gummy" stools (Monday).  He notes abdominal cramping Sunday into Monday and therefore he consumed an entire bottle of Pepto-Bismol.  This is likely the cause of his dark stools.  We will order stool cards x 3 to verify and if he has a BM during clinic today, this can be tested.    I will order some simethicone today in the clinic for his flatulence and an order for MOM for constipation.  Return in 3 weeks for follow-up and the embark on cycle #2 of treatment knowing that he returns next week for day 15 of treatment.

## 2015-12-16 NOTE — Patient Instructions (Signed)
[facility] Discharge Instructions for Patients Receiving Chemotherapy   Beginning January 23rd 2017 lab work for the Cancer Center will be done in the  Main lab at [facility] on 1st floor. If you have a lab appointment with the Cancer Center please come in thru the  Main Entrance and check in at the main information desk   Today you received the following chemotherapy agents:  Abraxane and Gemzar.  If you develop nausea and vomiting, or diarrhea that is not controlled by your medication, call the clinic.  The clinic phone number is [phone]. Office hours are Monday-Friday 8:30am-5:00pm.  BELOW ARE SYMPTOMS THAT SHOULD BE REPORTED IMMEDIATELY:  *FEVER GREATER THAN 101.0 F  *CHILLS WITH OR WITHOUT FEVER  NAUSEA AND VOMITING THAT IS NOT CONTROLLED WITH YOUR NAUSEA MEDICATION  *UNUSUAL SHORTNESS OF BREATH  *UNUSUAL BRUISING OR BLEEDING  TENDERNESS IN MOUTH AND THROAT WITH OR WITHOUT PRESENCE OF ULCERS  *URINARY PROBLEMS  *BOWEL PROBLEMS  UNUSUAL RASH Items with * indicate a potential emergency and should be followed up as soon as possible. If you have an emergency after office hours please contact your primary care physician or go to the nearest emergency department.  Please call the clinic during office hours if you have any questions or concerns.   You may also contact the Patient Navigator at [phone] should you have any questions or need assistance in obtaining follow up care.      Resources For Cancer Patients and their Caregivers ? American Cancer Society: Can assist with transportation, wigs, general needs, runs Look Good Feel Better.        1-888-227-6333 ? Cancer Care: Provides financial assistance, online support groups, medication/co-pay assistance.  1-800-813-HOPE (4673) ? [facility] Assists [place] cancer patients and their families through emotional , educational and financial support.   336-427-4357 ? [place] DSS Where to apply for food stamps, Medicaid and utility assistance. 336-342-1394 ? RCATS: Transportation to medical appointments. 336-347-2287 ? Social Security Administration: May apply for disability if have a Stage IV cancer. 336-342-7796 1-800-772-1213 ? [place] Aging, Disability and Transit Services: Assists with nutrition, care and transit needs. 336-349-2343         

## 2015-12-16 NOTE — Patient Instructions (Signed)
[place] at [facility] Discharge Instructions  RECOMMENDATIONS MADE BY THE CONSULTANT AND ANY TEST RESULTS WILL BE SENT TO YOUR REFERRING PHYSICIAN.  Exam done and seen today by Kirby Crigler We need you to do 3 stool cards. Treatment as scheduled today. We gave you a constipation sheet. Return to see the Doctor in 3 weeks for treatment and follow up Call the clinic for any concerns or questions.  Thank you for choosing [place] at [facility] to provide your oncology and hematology care.  To afford each patient quality time with our provider, please arrive at least 15 minutes before your scheduled appointment time.   Beginning January 23rd 2017 lab work for the [facility] will be done in the  Main lab at [facility] on 1st floor. If you have a lab appointment with the [place] please come in thru the  Main Entrance and check in at the main information desk  You need to re-schedule your appointment should you arrive 10 or more minutes late.  We strive to give you quality time with our providers, and arriving late affects you and other patients whose appointments are after yours.  Also, if you no show three or more times for appointments you may be dismissed from the clinic at the providers discretion.     Again, thank you for choosing [facility].  Our hope is that these requests will decrease the amount of time that you wait before being seen by our physicians.       _____________________________________________________________  Should you have questions after your visit to [facility], please contact our office at [phone] between the hours of 8:30 a.m. and 4:30 p.m.  Voicemails left after 4:30 p.m. will not be returned until the following business day.  For prescription refill requests, have your pharmacy contact our office.         Resources For Cancer Patients and their Caregivers ? American  Cancer Society: Can assist with transportation, wigs, general needs, runs Look Good Feel Better.        (216)397-1124 ? Cancer Care: Provides financial assistance, online support groups, medication/co-pay assistance.  1-800-813-HOPE (210)416-1433) ? [facility] Assists [place] cancer patients and their families through emotional , educational and financial support.  (938)170-5005 ? [place] DSS Where to apply for food stamps, Medicaid and utility assistance. 279-193-1806 ? RCATS: Transportation to medical appointments. (860)523-8862 ? Social Security Administration: May apply for disability if have a Stage IV cancer. 743-826-4658 636-348-4382 ? [employer], Disability and Transit Services: Assists with nutrition, care and transit needs. [place] Support Programs: @10RELATIVEDAYS @ > Cancer Support Group  2nd Tuesday of the month 1pm-2pm, Journey Room  > Creative Journey  3rd Tuesday of the month 1130am-1pm, Journey Room  > Look Good Feel Better  1st Wednesday of the month 10am-12 noon, Journey Room (Call [place] to register [phone])

## 2015-12-17 LAB — CANCER ANTIGEN 19-9: CA 19 9: 2255 U/mL — AB (ref 0–35)

## 2015-12-21 ENCOUNTER — Emergency Department (HOSPITAL_COMMUNITY)
Admission: EM | Admit: 2015-12-21 | Discharge: 2015-12-22 | Disposition: A | Discharge status: Home / Self Care | Payer: Medicaid Other | Attending: Emergency Medicine | Admitting: Emergency Medicine

## 2015-12-21 ENCOUNTER — Encounter (HOSPITAL_COMMUNITY): Payer: Self-pay | Admitting: *Deleted

## 2015-12-21 DIAGNOSIS — Z7982 Long term (current) use of aspirin: Secondary | ICD-10-CM | POA: Diagnosis not present

## 2015-12-21 DIAGNOSIS — F17210 Nicotine dependence, cigarettes, uncomplicated: Secondary | ICD-10-CM | POA: Insufficient documentation

## 2015-12-21 DIAGNOSIS — K5901 Slow transit constipation: Secondary | ICD-10-CM

## 2015-12-21 DIAGNOSIS — Z79899 Other long term (current) drug therapy: Secondary | ICD-10-CM | POA: Insufficient documentation

## 2015-12-21 DIAGNOSIS — K5641 Fecal impaction: Secondary | ICD-10-CM | POA: Insufficient documentation

## 2015-12-21 DIAGNOSIS — R109 Unspecified abdominal pain: Secondary | ICD-10-CM | POA: Diagnosis present

## 2015-12-21 LAB — POC OCCULT BLOOD, ED: Fecal Occult Bld: POSITIVE — AB

## 2015-12-21 NOTE — ED Provider Notes (Signed)
CSN: [id number]     Arrival date & time 12/21/15  1948 History  By signing my name below, I, Hansel Feinstein, attest that this documentation has been prepared under the direction and in the presence of Daleen Bo, MD. Electronically Signed: Hansel Feinstein, ED Scribe. 12/21/2015. 9:05 PM.     Chief Complaint  Patient presents with  . Abdominal Pain   The history is provided by the patient. No language interpreter was used.   HPI Comments: Nicholas [name] is a 65 y.o. male with h/o pancreatic cancer actively receiving chemotherapy, who presents to the Emergency Department complaining of intermittent, cramping abdominal pain onset 3 days ago and worsened today. Pt also complains of black tarry stools onset 6 days ago. Per pt, he saw his PCP 11 days ago for complaints of constipation, and was told they saw a small amount of blood in his stool at this time. Pt states he began taking Miralax and Senecot 11 days ago as prescribed by his PCP, which relieved his constipation until 3 days ago. He reports he has been able to tolerate food and fluids with his symptoms. He denies fever, increased cough from baseline. Pt is a current smoker, 0.5 ppd. Last chemo treatment was 5 days ago.   Past Medical History  Diagnosis Date  . Abdominal pain in male     "was told he has a Mass near pancreas"  . Cancer (HCC)     Skin cancer-ear, skin cancer -leeg,cancer of leg muscle- left., basal cell   . Pancreatic mass april 2017  . TB (pulmonary tuberculosis)     3 yrs ago-tx" positive TB test aslso" was told he is not contagious.treatment done for regulat TB.  Marland Kitchen Pancreatic cancer ([place]) 12/02/2015   Past Surgical History  Procedure Laterality Date  . Leg surgery Left     " cancer removed from muscle"  . Colonoscopy w/ polypectomy    . Eus N/A 11/25/2015    Procedure: ESOPHAGEAL ENDOSCOPIC ULTRASOUND (EUS) RADIAL;  Surgeon: Milus Banister, MD;  Location: [facility] ENDOSCOPY;  Service: Endoscopy;  Laterality: N/A;   Family  History  Problem Relation Age of Onset  . Pneumonia Father   . Stroke Mother    Social History  Substance Use Topics  . Smoking status: Current Every Day Smoker -- 0.50 packs/day for 30 years    Types: Cigarettes  . Smokeless tobacco: Never Used  . Alcohol Use: No     Comment: 1-2 beers at night - pt states he can't remember the last time he drank any alcohol    Review of Systems  All other systems reviewed and are negative.   Allergies  Review of patient's allergies indicates no known allergies.  Home Medications   Prior to Admission medications   Medication Sig Start Date End Date Taking? Authorizing Provider  aspirin EC 81 MG tablet Take 81 mg by mouth daily.   Yes Historical Provider, MD  Gemcitabine HCl (GEMZAR IV) Inject into the vein. To begin June 6   Yes Historical Provider, MD  lidocaine-prilocaine (EMLA) cream Apply a quarter size amount to port site 1 hour prior to chemo. Do not rub in. Cover with plastic wrap. 12/02/15  Yes Patrici Ranks, MD  Multiple Vitamins-Minerals (MULTIVITAMINS THER. W/MINERALS) TABS tablet Take 1 tablet by mouth daily. Reported on 12/09/2015   Yes Historical Provider, MD  ondansetron (ZOFRAN) 8 MG tablet Take 1 tablet (8 mg total) by mouth every 8 (eight) hours as needed for nausea  or vomiting. 12/02/15  Yes Patrici Ranks, MD  Oxycodone HCl 10 MG TABS Take 1 tablet (10 mg total) by mouth every 4 (four) hours as needed. Patient taking differently: Take 10 mg by mouth every 4 (four) hours as needed (for pain).  12/03/15  Yes Baird Cancer, PA-C  PACLitaxel Protein-Bound Part (ABRAXANE IV) Inject into the vein. To begin June 6   Yes Historical Provider, MD  polyethylene glycol (MIRALAX / GLYCOLAX) packet Take 17 g by mouth daily. Patient taking differently: Take 17 g by mouth every morning.  11/25/15  Yes Shanker Kristeen Mans, MD  prochlorperazine (COMPAZINE) 10 MG tablet Take 1 tablet (10 mg total) by mouth every 6 (six) hours as needed for  nausea or vomiting. 12/02/15  Yes Patrici Ranks, MD  senna-docusate (SENOKOT S) 8.6-50 MG tablet Take 2 tablets by mouth daily.   Yes Historical Provider, MD  feeding supplement, ENSURE ENLIVE, (ENSURE ENLIVE) LIQD Take 237 mLs by mouth 2 (two) times daily between meals. 11/25/15   Shanker Kristeen Mans, MD  hydrocerin (EUCERIN) CREA Apply 1 application topically 2 (two) times daily as needed (For dry skin.).    Historical Provider, MD  Menthol-Methyl Salicylate (THERA-GESIC) 1-15 % CREA Apply 1 application topically 4 (four) times daily as needed (For pain.).    Historical Provider, MD  oxyCODONE (OXYCONTIN) 15 mg 12 hr tablet Take 1 tablet (15 mg total) by mouth every 12 (twelve) hours. 12/02/15   Patrici Ranks, MD   BP 127/94 mmHg  Pulse 104  Temp(Src) 98.1 F (36.7 C) (Oral)  Resp 32  Ht 5\' 11"  (1.803 m)  Wt 115 lb (52.164 kg)  BMI 16.05 kg/m2  SpO2 96% Physical Exam  Constitutional: He is oriented to person, place, and time. He appears well-developed and well-nourished.  HENT:  Head: Normocephalic and atraumatic.  Right Ear: External ear normal.  Left Ear: External ear normal.  Eyes: Conjunctivae and EOM are normal. Pupils are equal, round, and reactive to light.  Neck: Normal range of motion and phonation normal. Neck supple.  Cardiovascular: Normal rate, regular rhythm and normal heart sounds.   Pulmonary/Chest: Effort normal and breath sounds normal. He exhibits no bony tenderness.  Abdominal: Soft. He exhibits distension. There is tenderness. There is no rebound and no guarding.  Abdomen disended. Mild diffuse tenderness. No rebound, no guarding.   Genitourinary:  Chaperone present throughout entire exam. Large fecal impaction with dark stool.   Musculoskeletal: Normal range of motion.  Neurological: He is alert and oriented to person, place, and time. No cranial nerve deficit or sensory deficit. He exhibits normal muscle tone. Coordination normal.  Skin: Skin is warm, dry  and intact. Rash noted.  Flat red rash 3-5 mm of the face and torso.   Psychiatric: He has a normal mood and affect. His behavior is normal. Judgment and thought content normal.  Nursing note and vitals reviewed.   ED Course  Procedures (including critical care time) DIAGNOSTIC STUDIES: Oxygen Saturation is 96% on RA, adequate by my interpretation.    COORDINATION OF CARE: 8:52 PM Discussed treatment plan with pt at bedside which includes enema and pt agreed to plan.   Medications - No data to display   Patient Vitals for the past 24 hrs:  BP Temp Temp src Pulse Resp SpO2 Height Weight  12/21/15 1951 127/94 mmHg 98.1 F (36.7 C) Oral 104 (!) 32 96 % 5\' 11"  (1.803 m) 115 lb (52.164 kg)    1:40 AM  Reevaluation with update and discussion. After initial assessment and treatment, an updated evaluation reveals He states he feels some better but still having some cramping. Repeat digital rectal examination review feels less fecal impaction but still moderate amount of stool in the distal rectum. Additional feculent disimpaction done by me, about 5 ounces of stool obtained. [place] Review Labs Reviewed  POC OCCULT BLOOD, ED - Abnormal; Notable for the following:    Fecal Occult Bld POSITIVE (*)    All other components within normal limits    Imaging Review No results found. I have personally reviewed and evaluated these images and lab results as part of my medical decision-making.   EKG Interpretation None      MDM   Final diagnoses:  Fecal impaction (HCC)  Slow transit constipation    Constipation related to narcotic use. Recent diagnosis of pancreatic cancer. Hemodynamically stable. Anticipate patient can be discharged to his disimpaction and constipation improves.  Nursing Notes Reviewed/ Care Coordinated Applicable Imaging Reviewed Interpretation of Laboratory Data incorporated into ED treatment  Care to Dr. Nevada Crane to disposition after second enema-  0145  I personally performed the services described in this documentation, which was scribed in my presence. The recorded information has been reviewed and is accurate.        Daleen Bo, MD 12/22/15 [phone]

## 2015-12-21 NOTE — ED Notes (Signed)
Pt brought in by [facility] for c/o abominal pain; pt states he has been having fewer BM's and they have been tarry

## 2015-12-22 ENCOUNTER — Telehealth (HOSPITAL_COMMUNITY): Payer: Self-pay

## 2015-12-22 MED ORDER — PEG 3350-KCL-NABCB-NACL-NASULF 236 G PO SOLR: 4000.0000 mL | Freq: Once | ORAL | Status: DC

## 2015-12-22 NOTE — Telephone Encounter (Signed)
Returned patient's phone call from yesterday. Left message to take 40ml of Milk of Mag this am, repeat dose at 1 pm if no BM. If ineffective, call us by 3 pm today.

## 2015-12-22 NOTE — ED Notes (Signed)
Patient was given full soap enema, without success, patient cleaned, vital signs stable, currently resting, no distress noted.

## 2015-12-22 NOTE — ED Provider Notes (Signed)
Patient signed out by Dr. Eulis Foster.  On recheck, he has had a few additional small bowel movements. He reports some improvement of his discomfort. I offered the patient admission versus discharge home with GoLYTELY for continued bowel cleanout. Patient will like to go home. His abdomen is soft and exam is reassuring.   Merryl Hacker, MD 12/22/15 [phone]

## 2015-12-22 NOTE — Discharge Instructions (Signed)
Constipation, Adult Constipation is when a person has fewer than three bowel movements a week, has difficulty having a bowel movement, or has stools that are dry, hard, or larger than normal. As people grow older, constipation is more common. A low-fiber diet, not taking in enough fluids, and taking certain medicines may make constipation worse.  CAUSES   Certain medicines, such as antidepressants, pain medicine, iron supplements, antacids, and water pills.   Certain diseases, such as diabetes, irritable bowel syndrome (IBS), thyroid disease, or depression.   Not drinking enough water.   Not eating enough fiber-rich foods.   Stress or travel.   Lack of physical activity or exercise.   Ignoring the urge to have a bowel movement.   Using laxatives too much.  SIGNS AND SYMPTOMS   Having fewer than three bowel movements a week.   Straining to have a bowel movement.   Having stools that are hard, dry, or larger than normal.   Feeling full or bloated.   Pain in the lower abdomen.   Not feeling relief after having a bowel movement.  DIAGNOSIS  Your health care provider will take a medical history and perform a physical exam. Further testing may be done for severe constipation. Some tests may include:  A barium enema X-ray to examine your rectum, colon, and, sometimes, your small intestine.   A sigmoidoscopy to examine your lower colon.   A colonoscopy to examine your entire colon. TREATMENT  Treatment will depend on the severity of your constipation and what is causing it. Some dietary treatments include drinking more fluids and eating more fiber-rich foods. Lifestyle treatments may include regular exercise. If these diet and lifestyle recommendations do not help, your health care provider may recommend taking over-the-counter laxative medicines to help you have bowel movements. Prescription medicines may be prescribed if over-the-counter medicines do not work.    HOME CARE INSTRUCTIONS   Eat foods that have a lot of fiber, such as fruits, vegetables, whole grains, and beans.  Limit foods high in fat and processed sugars, such as french fries, hamburgers, cookies, candies, and soda.   A fiber supplement may be added to your diet if you cannot get enough fiber from foods.   Drink enough fluids to keep your urine clear or pale yellow.   Exercise regularly or as directed by your health care provider.   Go to the restroom when you have the urge to go. Do not hold it.   Only take over-the-counter or prescription medicines as directed by your health care provider. Do not take other medicines for constipation without talking to your health care provider first.  [place] IF:   You have bright red blood in your stool.   Your constipation lasts for more than 4 days or gets worse.   You have abdominal or rectal pain.   You have thin, pencil-like stools.   You have unexplained weight loss. MAKE SURE YOU:   Understand these instructions.  Will watch your condition.  Will get help right away if you are not doing well or get worse.   This information is not intended to replace advice given to you by your health care provider. Make sure you discuss any questions you have with your health care provider.   Document Released: 03/19/2004 Document Revised: 07/12/2014 Document Reviewed: 04/02/2013 Elsevier Interactive Patient Education 2016 [place].  Fecal Impaction A fecal impaction happens when there is a large, firm amount of stool (or feces) that cannot  be passed. The impacted stool is usually in the rectum, which is the lowest part of the large bowel. The impacted stool can block the colon and cause significant problems. CAUSES  The longer stool stays in the rectum, the harder it gets. Anything that slows down your bowel movements can lead to fecal impaction, such as:  Constipation. This can be a long-standing  (chronic) problem or can happen suddenly (acute).  Painful conditions of the rectum, such as hemorrhoids or anal fissures. The pain of these conditions can make you try to avoid having bowel movements.  Narcotic pain-relieving medicines, such as methadone, morphine, or codeine.  Not drinking enough fluids.  Inactivity and bed rest over long periods of time.  Diseases of the brain or nervous system that damage the nerves controlling the muscles of the intestines. SIGNS AND SYMPTOMS   Lack of normal bowel movements or changes in bowel patterns.  Sense of fullness in the rectum but unable to pass stool.  Pain or cramps in the abdominal area (often after meals).  Thin, watery discharge from the rectum. DIAGNOSIS  Your health care provider may suspect that you have a fecal impaction based on your symptoms and a physical exam. This will include an exam of your rectum. Sometimes X-rays or lab testing may be needed to confirm the diagnosis and to be sure there are no other problems.  TREATMENT   Initially an impaction can be removed manually. Using a gloved finger, your health care provider can remove hard stool from your rectum.  Medicine is sometimes needed. A suppository or enema can be given in the rectum to soften the stool, which can stimulate a bowel movement. Medicines can also be given by mouth (orally).  Though rare, surgery may be needed if the colon has torn (perforated) due to blockage. HOME CARE INSTRUCTIONS   Develop regular bowel habits. This could include getting in the habit of having a bowel movement after your morning cup of coffee or after eating. Be sure to allow yourself enough time on the toilet.  Maintain a high-fiber diet.  Drink enough fluids to keep your urine clear or pale yellow as directed by your health care provider.  Exercise regularly.  If you begin to get constipated, increase the amount of fiber in your diet. Eat plenty of fruits, vegetables, whole  wheat breads, bran, oatmeal, and similar products.  Take natural fiber laxatives or other laxatives only as directed by your health care provider. SEEK MEDICAL CARE IF:   You have ongoing rectal pain.  You require enemas or suppositories more than twice a week.  You have rectal bleeding.  You have continued problems, or you develop abdominal pain.  You have thin, pencil-like stools. SEEK IMMEDIATE MEDICAL CARE IF:  You have black or tarry stools. MAKE SURE YOU:   Understand these instructions.  Will watch your condition.  Will get help right away if you are not doing well or get worse.   This information is not intended to replace advice given to you by your health care provider. Make sure you discuss any questions you have with your health care provider.   Document Released: 03/13/2004 Document Revised: 04/11/2013 Document Reviewed: 12/26/2012 Elsevier Interactive Patient Education [employer].

## 2015-12-23 ENCOUNTER — Other Ambulatory Visit (HOSPITAL_COMMUNITY): Payer: Self-pay | Admitting: *Deleted

## 2015-12-23 ENCOUNTER — Encounter (HOSPITAL_COMMUNITY): Payer: Self-pay

## 2015-12-23 ENCOUNTER — Encounter (HOSPITAL_BASED_OUTPATIENT_CLINIC_OR_DEPARTMENT_OTHER): Payer: Medicaid Other

## 2015-12-23 DIAGNOSIS — C258 Malignant neoplasm of overlapping sites of pancreas: Secondary | ICD-10-CM

## 2015-12-23 DIAGNOSIS — Z452 Encounter for adjustment and management of vascular access device: Secondary | ICD-10-CM

## 2015-12-23 DIAGNOSIS — K869 Disease of pancreas, unspecified: Secondary | ICD-10-CM | POA: Diagnosis not present

## 2015-12-23 DIAGNOSIS — K921 Melena: Secondary | ICD-10-CM | POA: Insufficient documentation

## 2015-12-23 LAB — CBC WITH DIFFERENTIAL/PLATELET
Basophils Absolute: 0 10*3/uL (ref 0.0–0.1)
Basophils Relative: 0 %
EOS PCT: 1 %
Eosinophils Absolute: 0.1 10*3/uL (ref 0.0–0.7)
HCT: 35.2 % — ABNORMAL LOW (ref 39.0–52.0)
Hemoglobin: 12.4 g/dL — ABNORMAL LOW (ref 13.0–17.0)
LYMPHS ABS: 1.7 10*3/uL (ref 0.7–4.0)
Lymphocytes Relative: 32 %
MCH: 33.6 pg (ref 26.0–34.0)
MCHC: 35.2 g/dL (ref 30.0–36.0)
MCV: 95.4 fL (ref 78.0–100.0)
Monocytes Absolute: 0.6 10*3/uL (ref 0.1–1.0)
Monocytes Relative: 12 %
NEUTROS PCT: 55 %
Neutro Abs: 3 10*3/uL (ref 1.7–7.7)
PLATELETS: 83 10*3/uL — AB (ref 150–400)
RBC: 3.69 MIL/uL — AB (ref 4.22–5.81)
RDW: 12.2 % (ref 11.5–15.5)
WBC: 5.4 10*3/uL (ref 4.0–10.5)

## 2015-12-23 LAB — COMPREHENSIVE METABOLIC PANEL
ALK PHOS: 73 U/L (ref 38–126)
ALT: 21 U/L (ref 17–63)
AST: 21 U/L (ref 15–41)
Albumin: 3.7 g/dL (ref 3.5–5.0)
Anion gap: 10 (ref 5–15)
BUN: 9 mg/dL (ref 6–20)
CALCIUM: 8.6 mg/dL — AB (ref 8.9–10.3)
CHLORIDE: 98 mmol/L — AB (ref 101–111)
CO2: 24 mmol/L (ref 22–32)
CREATININE: 0.54 mg/dL — AB (ref 0.61–1.24)
Glucose, Bld: 128 mg/dL — ABNORMAL HIGH (ref 65–99)
Potassium: 3.5 mmol/L (ref 3.5–5.1)
Sodium: 132 mmol/L — ABNORMAL LOW (ref 135–145)
Total Bilirubin: 0.6 mg/dL (ref 0.3–1.2)
Total Protein: 7.2 g/dL (ref 6.5–8.1)

## 2015-12-23 LAB — OCCULT BLOOD X 1 CARD TO LAB, STOOL
FECAL OCCULT BLD: NEGATIVE
FECAL OCCULT BLD: NEGATIVE
Fecal Occult Bld: NEGATIVE

## 2015-12-23 MED ORDER — SODIUM CHLORIDE 0.9% FLUSH
10.0000 mL | INTRAVENOUS | Status: DC | PRN
  Administered 2015-12-23: 10 mL via INTRAVENOUS
  Filled 2015-12-23: qty 10

## 2015-12-23 MED ORDER — HEPARIN SOD (PORK) LOCK FLUSH 100 UNIT/ML IV SOLN
500.0000 [IU] | Freq: Once | INTRAVENOUS | Status: AC
  Administered 2015-12-23: 500 [IU] via INTRAVENOUS

## 2015-12-23 NOTE — Patient Instructions (Signed)
[place] at [facility] Discharge Instructions  RECOMMENDATIONS MADE BY THE CONSULTANT AND ANY TEST RESULTS WILL BE SENT TO YOUR REFERRING PHYSICIAN.  Your treatment will be deferred until next week due to your low platelet count (83,000). Watch for any unusual bruising or bleeding. If you have bleeding that cannot be stopped, go to the Emergency Department. Call the clinic should you have any questions or concerns prior to your next visit.   Thank you for choosing [place] at [facility] to provide your oncology and hematology care.  To afford each patient quality time with our provider, please arrive at least 15 minutes before your scheduled appointment time.   Beginning January 23rd 2017 lab work for the [facility] will be done in the  Main lab at [facility] on 1st floor. If you have a lab appointment with the [place] please come in thru the  Main Entrance and check in at the main information desk  You need to re-schedule your appointment should you arrive 10 or more minutes late.  We strive to give you quality time with our providers, and arriving late affects you and other patients whose appointments are after yours.  Also, if you no show three or more times for appointments you may be dismissed from the clinic at the providers discretion.     Again, thank you for choosing [facility].  Our hope is that these requests will decrease the amount of time that you wait before being seen by our physicians.       _____________________________________________________________  Should you have questions after your visit to [facility] please contact our office at [phone] between the hours of 8:30 a.m. and 4:30 p.m.  Voicemails left after 4:30 p.m. will not be returned until the following business day.  For prescription refill requests, have your pharmacy contact our office.         Resources For Cancer  Patients and their Caregivers ? American Cancer Society: Can assist with transportation, wigs, general needs, runs Look Good Feel Better.        931 451 2157 ? Cancer Care: Provides financial assistance, online support groups, medication/co-pay assistance.  1-800-813-HOPE 313-319-6849) ? [facility] Assists [place] cancer patients and their families through emotional , educational and financial support.  (714)634-0019 ? [place] DSS Where to apply for food stamps, Medicaid and utility assistance. 6078583880 ? RCATS: Transportation to medical appointments. 437-803-6326 ? Social Security Administration: May apply for disability if have a Stage IV cancer. (747) [phone] 703 2302 ? [employer], Disability and Transit Services: Assists with nutrition, care and transit needs. [place] Support Programs: @10RELATIVEDAYS @ > Cancer Support Group  2nd Tuesday of the month 1pm-2pm, Journey Room  > Creative Journey  3rd Tuesday of the month 1130am-1pm, Journey Room  > Look Good Feel Better  1st Wednesday of the month 10am-12 noon, Journey Room (Call [place] to register [phone])

## 2015-12-23 NOTE — Progress Notes (Signed)
Platelet count 83,000.  T. Kefalas, PA made aware - will hold tx today.

## 2015-12-30 ENCOUNTER — Ambulatory Visit (HOSPITAL_COMMUNITY): Payer: Medicaid Other

## 2015-12-30 ENCOUNTER — Encounter (HOSPITAL_BASED_OUTPATIENT_CLINIC_OR_DEPARTMENT_OTHER): Payer: Medicaid Other

## 2015-12-30 VITALS — BP 132/67 | HR 87 | Temp 98.1°F | Resp 18 | Wt 111.9 lb

## 2015-12-30 DIAGNOSIS — Z5111 Encounter for antineoplastic chemotherapy: Secondary | ICD-10-CM

## 2015-12-30 DIAGNOSIS — K8689 Other specified diseases of pancreas: Secondary | ICD-10-CM

## 2015-12-30 DIAGNOSIS — C258 Malignant neoplasm of overlapping sites of pancreas: Secondary | ICD-10-CM | POA: Diagnosis not present

## 2015-12-30 DIAGNOSIS — C251 Malignant neoplasm of body of pancreas: Secondary | ICD-10-CM

## 2015-12-30 DIAGNOSIS — K869 Disease of pancreas, unspecified: Secondary | ICD-10-CM | POA: Diagnosis not present

## 2015-12-30 DIAGNOSIS — K769 Liver disease, unspecified: Secondary | ICD-10-CM

## 2015-12-30 LAB — COMPREHENSIVE METABOLIC PANEL
ALK PHOS: 67 U/L (ref 38–126)
ALT: 12 U/L — AB (ref 17–63)
AST: 15 U/L (ref 15–41)
Albumin: 3.4 g/dL — ABNORMAL LOW (ref 3.5–5.0)
Anion gap: 7 (ref 5–15)
BUN: 12 mg/dL (ref 6–20)
CALCIUM: 8.8 mg/dL — AB (ref 8.9–10.3)
CHLORIDE: 99 mmol/L — AB (ref 101–111)
CO2: 26 mmol/L (ref 22–32)
CREATININE: 0.41 mg/dL — AB (ref 0.61–1.24)
Glucose, Bld: 161 mg/dL — ABNORMAL HIGH (ref 65–99)
Potassium: 4.2 mmol/L (ref 3.5–5.1)
Sodium: 132 mmol/L — ABNORMAL LOW (ref 135–145)
Total Bilirubin: 0.3 mg/dL (ref 0.3–1.2)
Total Protein: 6.9 g/dL (ref 6.5–8.1)

## 2015-12-30 LAB — CBC WITH DIFFERENTIAL/PLATELET
BASOS ABS: 0 10*3/uL (ref 0.0–0.1)
Basophils Relative: 0 %
EOS PCT: 1 %
Eosinophils Absolute: 0.1 10*3/uL (ref 0.0–0.7)
HEMATOCRIT: 32.7 % — AB (ref 39.0–52.0)
HEMOGLOBIN: 11.4 g/dL — AB (ref 13.0–17.0)
LYMPHS ABS: 1.1 10*3/uL (ref 0.7–4.0)
LYMPHS PCT: 12 %
MCH: 33.5 pg (ref 26.0–34.0)
MCHC: 34.9 g/dL (ref 30.0–36.0)
MCV: 96.2 fL (ref 78.0–100.0)
Monocytes Absolute: 1.2 10*3/uL — ABNORMAL HIGH (ref 0.1–1.0)
Monocytes Relative: 12 %
NEUTROS ABS: 7.1 10*3/uL (ref 1.7–7.7)
NEUTROS PCT: 75 %
PLATELETS: 392 10*3/uL (ref 150–400)
RBC: 3.4 MIL/uL — AB (ref 4.22–5.81)
RDW: 12.9 % (ref 11.5–15.5)
WBC: 9.4 10*3/uL (ref 4.0–10.5)

## 2015-12-30 MED ORDER — HEPARIN SOD (PORK) LOCK FLUSH 100 UNIT/ML IV SOLN
500.0000 [IU] | Freq: Once | INTRAVENOUS | Status: AC | PRN
  Administered 2015-12-30: 500 [IU]

## 2015-12-30 MED ORDER — SODIUM CHLORIDE 0.9 % IV SOLN
1000.0000 mg/m2 | Freq: Once | INTRAVENOUS | Status: AC
  Administered 2015-12-30: 1634 mg via INTRAVENOUS
  Filled 2015-12-30: qty 42.98

## 2015-12-30 MED ORDER — SODIUM CHLORIDE 0.9 % IV SOLN
Freq: Once | INTRAVENOUS | Status: AC
  Administered 2015-12-30: 12:00:00 via INTRAVENOUS
  Filled 2015-12-30: qty 4

## 2015-12-30 MED ORDER — SODIUM CHLORIDE 0.9% FLUSH: 10.0000 mL | INTRAVENOUS | Status: DC | PRN

## 2015-12-30 MED ORDER — SODIUM CHLORIDE 0.9 % IV SOLN
Freq: Once | INTRAVENOUS | Status: AC
  Administered 2015-12-30: 12:00:00 via INTRAVENOUS

## 2015-12-30 MED ORDER — PACLITAXEL PROTEIN-BOUND CHEMO INJECTION 100 MG
125.0000 mg/m2 | Freq: Once | INTRAVENOUS | Status: AC
  Administered 2015-12-30: 200 mg via INTRAVENOUS
  Filled 2015-12-30: qty 40

## 2015-12-30 MED ORDER — OXYCODONE HCL 10 MG PO TABS: 10.0000 mg | ORAL_TABLET | ORAL | Status: DC | PRN

## 2015-12-31 ENCOUNTER — Encounter (HOSPITAL_COMMUNITY): Payer: Self-pay

## 2015-12-31 NOTE — Patient Instructions (Signed)
[facility] Discharge Instructions for Patients Receiving Chemotherapy   Beginning January 23rd 2017 lab work for the Cancer Center will be done in the  Main lab at [place] on 1st floor. If you have a lab appointment with the Cancer Center please come in thru the  Main Entrance and check in at the main information desk   Today you received the following chemotherapy agents:  Abraxane and Gemzar.  If you develop nausea and vomiting, or diarrhea that is not controlled by your medication, call the clinic.  The clinic phone number is [phone]. Office hours are Monday-Friday 8:30am-5:00pm.  BELOW ARE SYMPTOMS THAT SHOULD BE REPORTED IMMEDIATELY:  *FEVER GREATER THAN 101.0 F  *CHILLS WITH OR WITHOUT FEVER  NAUSEA AND VOMITING THAT IS NOT CONTROLLED WITH YOUR NAUSEA MEDICATION  *UNUSUAL SHORTNESS OF BREATH  *UNUSUAL BRUISING OR BLEEDING  TENDERNESS IN MOUTH AND THROAT WITH OR WITHOUT PRESENCE OF ULCERS  *URINARY PROBLEMS  *BOWEL PROBLEMS  UNUSUAL RASH Items with * indicate a potential emergency and should be followed up as soon as possible. If you have an emergency after office hours please contact your primary care physician or go to the nearest emergency department.  Please call the clinic during office hours if you have any questions or concerns.   You may also contact the Patient Navigator at [phone] should you have any questions or need assistance in obtaining follow up care.      Resources For Cancer Patients and their Caregivers ? American Cancer Society: Can assist with transportation, wigs, general needs, runs Look Good Feel Better.        1-888-227-6333 ? Cancer Care: Provides financial assistance, online support groups, medication/co-pay assistance.  1-800-813-HOPE (4673) ? [facility] Assists [place] cancer patients and their families through emotional , educational and financial support.   336-427-4357 ? [place] DSS Where to apply for food stamps, Medicaid and utility assistance. 336-342-1394 ? RCATS: Transportation to medical appointments. 336-347-2287 ? Social Security Administration: May apply for disability if have a Stage IV cancer. 336-342-7796 1-800-772-1213 ? [place] Aging, Disability and Transit Services: Assists with nutrition, care and transit needs. 336-349-2343         

## 2016-01-02 LAB — MAC SUSCEPTIBILITY BROTH
Amikacin: 16
Ciprofloxacin: >16
Clarithromycin: 2
Moxifloxacin: 8

## 2016-01-02 LAB — ACID FAST CULTURE WITH REFLEXED SENSITIVITIES (MYCOBACTERIA): Acid Fast Culture: POSITIVE — AB

## 2016-01-02 LAB — AFB ORGANISM ID BY DNA PROBE
M TUBERCULOSIS COMPLEX: NEGATIVE
M avium complex: POSITIVE — AB

## 2016-01-07 ENCOUNTER — Ambulatory Visit (HOSPITAL_COMMUNITY): Payer: Medicaid Other | Admitting: Hematology & Oncology

## 2016-01-07 ENCOUNTER — Encounter (HOSPITAL_BASED_OUTPATIENT_CLINIC_OR_DEPARTMENT_OTHER): Payer: Medicaid Other

## 2016-01-07 ENCOUNTER — Ambulatory Visit (HOSPITAL_COMMUNITY): Payer: Medicaid Other

## 2016-01-07 ENCOUNTER — Inpatient Hospital Stay (HOSPITAL_COMMUNITY): Payer: Medicaid Other

## 2016-01-07 ENCOUNTER — Encounter (HOSPITAL_COMMUNITY): Payer: Medicaid Other | Attending: Hematology & Oncology | Admitting: Hematology & Oncology

## 2016-01-07 ENCOUNTER — Other Ambulatory Visit (HOSPITAL_COMMUNITY): Payer: Medicaid Other

## 2016-01-07 ENCOUNTER — Encounter (HOSPITAL_COMMUNITY): Payer: Self-pay | Admitting: Hematology & Oncology

## 2016-01-07 VITALS — BP 121/72 | HR 108 | Temp 98.0°F | Resp 20 | Wt 110.3 lb

## 2016-01-07 VITALS — BP 153/75 | HR 86 | Temp 98.0°F | Resp 20

## 2016-01-07 DIAGNOSIS — K7689 Other specified diseases of liver: Secondary | ICD-10-CM

## 2016-01-07 DIAGNOSIS — C258 Malignant neoplasm of overlapping sites of pancreas: Secondary | ICD-10-CM | POA: Insufficient documentation

## 2016-01-07 DIAGNOSIS — K769 Liver disease, unspecified: Secondary | ICD-10-CM

## 2016-01-07 DIAGNOSIS — K5900 Constipation, unspecified: Secondary | ICD-10-CM | POA: Diagnosis present

## 2016-01-07 DIAGNOSIS — E876 Hypokalemia: Secondary | ICD-10-CM | POA: Insufficient documentation

## 2016-01-07 DIAGNOSIS — K869 Disease of pancreas, unspecified: Secondary | ICD-10-CM | POA: Diagnosis not present

## 2016-01-07 DIAGNOSIS — G893 Neoplasm related pain (acute) (chronic): Secondary | ICD-10-CM | POA: Diagnosis not present

## 2016-01-07 DIAGNOSIS — Z8611 Personal history of tuberculosis: Secondary | ICD-10-CM | POA: Insufficient documentation

## 2016-01-07 DIAGNOSIS — K8689 Other specified diseases of pancreas: Secondary | ICD-10-CM

## 2016-01-07 DIAGNOSIS — R143 Flatulence: Secondary | ICD-10-CM | POA: Diagnosis present

## 2016-01-07 DIAGNOSIS — Z5111 Encounter for antineoplastic chemotherapy: Secondary | ICD-10-CM | POA: Diagnosis not present

## 2016-01-07 DIAGNOSIS — C251 Malignant neoplasm of body of pancreas: Secondary | ICD-10-CM

## 2016-01-07 LAB — COMPREHENSIVE METABOLIC PANEL
ALBUMIN: 3.4 g/dL — AB (ref 3.5–5.0)
ALT: 19 U/L (ref 17–63)
AST: 19 U/L (ref 15–41)
Alkaline Phosphatase: 68 U/L (ref 38–126)
Anion gap: 6 (ref 5–15)
BUN: 9 mg/dL (ref 6–20)
CHLORIDE: 101 mmol/L (ref 101–111)
CO2: 25 mmol/L (ref 22–32)
Calcium: 8.6 mg/dL — ABNORMAL LOW (ref 8.9–10.3)
Creatinine, Ser: 0.48 mg/dL — ABNORMAL LOW (ref 0.61–1.24)
GFR calc Af Amer: >60 mL/min (ref >60)
GFR calc non Af Amer: >60 mL/min (ref >60)
GLUCOSE: 159 mg/dL — AB (ref 65–99)
POTASSIUM: 4.1 mmol/L (ref 3.5–5.1)
SODIUM: 132 mmol/L — AB (ref 135–145)
Total Bilirubin: 0.4 mg/dL (ref 0.3–1.2)
Total Protein: 6.8 g/dL (ref 6.5–8.1)

## 2016-01-07 LAB — CBC WITH DIFFERENTIAL/PLATELET
BASOS ABS: 0 10*3/uL (ref 0.0–0.1)
BASOS PCT: 0 %
EOS PCT: 1 %
Eosinophils Absolute: 0.1 10*3/uL (ref 0.0–0.7)
HCT: 32.1 % — ABNORMAL LOW (ref 39.0–52.0)
Hemoglobin: 11 g/dL — ABNORMAL LOW (ref 13.0–17.0)
LYMPHS PCT: 16 %
Lymphs Abs: 1.3 10*3/uL (ref 0.7–4.0)
MCH: 32.8 pg (ref 26.0–34.0)
MCHC: 34.3 g/dL (ref 30.0–36.0)
MCV: 95.8 fL (ref 78.0–100.0)
Monocytes Absolute: 0.6 10*3/uL (ref 0.1–1.0)
Monocytes Relative: 8 %
Neutro Abs: 6.2 10*3/uL (ref 1.7–7.7)
Neutrophils Relative %: 75 %
PLATELETS: 184 10*3/uL (ref 150–400)
RBC: 3.35 MIL/uL — AB (ref 4.22–5.81)
RDW: 12.9 % (ref 11.5–15.5)
WBC: 8.3 10*3/uL (ref 4.0–10.5)

## 2016-01-07 MED ORDER — OXYCODONE HCL ER 15 MG PO T12A: 15.0000 mg | EXTENDED_RELEASE_TABLET | Freq: Two times a day (BID) | ORAL | Status: DC

## 2016-01-07 MED ORDER — HEPARIN SOD (PORK) LOCK FLUSH 100 UNIT/ML IV SOLN
500.0000 [IU] | Freq: Once | INTRAVENOUS | Status: AC | PRN
  Administered 2016-01-07: 500 [IU]
  Filled 2016-01-07: qty 5

## 2016-01-07 MED ORDER — SODIUM CHLORIDE 0.9 % IV SOLN
Freq: Once | INTRAVENOUS | Status: AC
  Administered 2016-01-07: 11:00:00 via INTRAVENOUS
  Filled 2016-01-07: qty 4

## 2016-01-07 MED ORDER — SODIUM CHLORIDE 0.9 % IV SOLN
Freq: Once | INTRAVENOUS | Status: AC
  Administered 2016-01-07: 11:00:00 via INTRAVENOUS

## 2016-01-07 MED ORDER — PACLITAXEL PROTEIN-BOUND CHEMO INJECTION 100 MG
125.0000 mg/m2 | Freq: Once | INTRAVENOUS | Status: AC
  Administered 2016-01-07: 200 mg via INTRAVENOUS
  Filled 2016-01-07: qty 40

## 2016-01-07 MED ORDER — MIRTAZAPINE 15 MG PO TABS: ORAL_TABLET | ORAL | Status: DC

## 2016-01-07 MED ORDER — SODIUM CHLORIDE 0.9 % IV SOLN
1000.0000 mg/m2 | Freq: Once | INTRAVENOUS | Status: AC
  Administered 2016-01-07: 1634 mg via INTRAVENOUS
  Filled 2016-01-07: qty 42.98

## 2016-01-07 MED ORDER — PANCRELIPASE (LIP-PROT-AMYL) 36000-114000 UNITS PO CPEP: 36000.0000 [IU] | ORAL_CAPSULE | Freq: Three times a day (TID) | ORAL | Status: DC

## 2016-01-07 MED ORDER — SODIUM CHLORIDE 0.9% FLUSH: 10.0000 mL | INTRAVENOUS | Status: DC | PRN

## 2016-01-07 NOTE — Patient Instructions (Signed)
[facility] Discharge Instructions for Patients Receiving Chemotherapy   Beginning January 23rd 2017 lab work for the Cancer Center will be done in the  Main lab at [place] on 1st floor. If you have a lab appointment with the Cancer Center please come in thru the  Main Entrance and check in at the main information desk   Today you received the following chemotherapy agents:  Abraxane and Gemzar.  If you develop nausea and vomiting, or diarrhea that is not controlled by your medication, call the clinic.  The clinic phone number is [phone]. Office hours are Monday-Friday 8:30am-5:00pm.  BELOW ARE SYMPTOMS THAT SHOULD BE REPORTED IMMEDIATELY:  *FEVER GREATER THAN 101.0 F  *CHILLS WITH OR WITHOUT FEVER  NAUSEA AND VOMITING THAT IS NOT CONTROLLED WITH YOUR NAUSEA MEDICATION  *UNUSUAL SHORTNESS OF BREATH  *UNUSUAL BRUISING OR BLEEDING  TENDERNESS IN MOUTH AND THROAT WITH OR WITHOUT PRESENCE OF ULCERS  *URINARY PROBLEMS  *BOWEL PROBLEMS  UNUSUAL RASH Items with * indicate a potential emergency and should be followed up as soon as possible. If you have an emergency after office hours please contact your primary care physician or go to the nearest emergency department.  Please call the clinic during office hours if you have any questions or concerns.   You may also contact the Patient Navigator at [phone] should you have any questions or need assistance in obtaining follow up care.      Resources For Cancer Patients and their Caregivers ? American Cancer Society: Can assist with transportation, wigs, general needs, runs Look Good Feel Better.        1-888-227-6333 ? Cancer Care: Provides financial assistance, online support groups, medication/co-pay assistance.  1-800-813-HOPE (4673) ? [facility] Assists [place] cancer patients and their families through emotional , educational and financial support.   336-427-4357 ? [place] DSS Where to apply for food stamps, Medicaid and utility assistance. 336-342-1394 ? RCATS: Transportation to medical appointments. 336-347-2287 ? Social Security Administration: May apply for disability if have a Stage IV cancer. 336-342-7796 1-800-772-1213 ? [place] Aging, Disability and Transit Services: Assists with nutrition, care and transit needs. 336-349-2343         

## 2016-01-07 NOTE — Progress Notes (Signed)
[name]  Progress Note  Patient Care Team: Provider Not In System as PCP - General  CHIEF COMPLAINTS/PURPOSE OF CONSULTATION:  Adenocarcinoma of body/tail of pancreas, T4N0Mx History of TB, treated by [facility] History of MAC Weight Loss History Hyponatremia Tobacco Abuse    Pancreatic cancer ([place])   11/11/2015 - 11/13/2015 Hospital Admission abdominal pain, cramping, diarrhea, weight loss, hyponatremia CT pancreatic body mass   11/12/2015 Imaging 4.7 cm mass of pancreas to L of midline, involvement of several vascular structures also possibly the distal duodenum   11/21/2015 - 11/25/2015 Hospital Admission EUS on 5/23, hyponatremia   11/22/2015 Imaging Diffuse mesenteric edema, large hypo enhancing pancreatic mass, abutment of celiac axis, SMA, encasement of splenic artery, occludes splenic vein, compresses porta splenic confluence   11/25/2015 Procedure EUS Dr. Ardis Hughs, irreg mass in pancreatic body/tail. 4.2 cm, involvement of major blood vessels not appreciated due to size of mass   11/25/2015 Pathology Results malignant cells c/w adenocarcinoma   12/04/2015 Miscellaneous Acid Fast Smear negative   12/04/2015 Imaging No convincing evidence of pulm mets, improvement in LLL, RUL angular nodular thickening seen on comparison CT from 2013, residual cavitation remains at the L lung base, one nodule oblique fissue on R 30m new   12/08/2015 Procedure Port a cath   12/09/2015 -  Chemotherapy Gemzar/Abraxane days 1, 8, and 15 every 28 days.   12/12/2015 Imaging MRI abd- Limited MRI, D/C prior to completion at patient request. Large infiltrative 8.1 x 4.7 x 5.8 cm pancreatic mass centered at the junction of the pancreatic body and tail, consistent with known primary pancreatic adenocarc   12/23/2015 Treatment Plan Change Treatment deferred x 7 days due to thrombocytopenia    HISTORY OF PRESENTING ILLNESS:  Nicholas Crumble89y.o. male is here for additional follow-up of  locally advanced pancreatic cancer. Not felt to be a surgical candidate.   Mr. LLipariis unaccompanied. He is here for treatment.  He has been eating "like a horse" and still has not gained weight. His taste buds have come back. He has been drinking milk, Propel, Gatorade, and Boost. He has been eating ice cream, steak, chicken, meatloaf, oatmeal, and shrimp. "I don't understand how I'm not gaining weight". He takes his multivitamins every morning along with a baby aspirin. He tries to drink 2 Boost before every meal.  His pain has improved, it is now a nagging ache and similar to cramps. These cramps happen most right before he has a bowel movement. After using the bathroom, the pain will come back though not as much. He takes the oxycontin at night, though he has not been sleeping well still. This lack of sleep has been making him feel weak.   He walked 2 miles this morning and was out of energy. He would like to get back to walking 10 miles daily again. He wants to go back to work and gain weight. He would like more energy so he can return to work.   Reports his stool looks mucous-like at times. Denies blood in his stool.    Reports he becomes "swimmy-headed" at times, especially when he bends down.   He is curious when he will be able to drink a glass of wine before bed.  He has been smoking more while being at home and bored. He is back to 1 ppd. Chronic cough is unchanged.   MEDICAL HISTORY:  Past Medical History  Diagnosis Date  . Abdominal pain in  male     "was told he has a Mass near pancreas"  . Cancer (HCC)     Skin cancer-ear, skin cancer -leeg,cancer of leg muscle- left., basal cell   . Pancreatic mass april 2017  . TB (pulmonary tuberculosis)     3 yrs ago-tx" positive TB test aslso" was told he is not contagious.treatment done for regulat TB.  Marland Kitchen Pancreatic cancer ([place]) 12/02/2015    SURGICAL HISTORY: Past Surgical History  Procedure Laterality Date  . Leg surgery  Left     " cancer removed from muscle"  . Colonoscopy w/ polypectomy    . Eus N/A 11/25/2015    Procedure: ESOPHAGEAL ENDOSCOPIC ULTRASOUND (EUS) RADIAL;  Surgeon: Milus Banister, MD;  Location: [facility];  Service: Endoscopy;  Laterality: N/A;    SOCIAL HISTORY: Social History   Social History  . Marital Status: Single    Spouse Name: N/A  . Number of Children: N/A  . Years of Education: N/A   Occupational History  . Not on file.   Social History Main Topics  . Smoking status: Current Every Day Smoker -- 0.50 packs/day for 30 years    Types: Cigarettes  . Smokeless tobacco: Never Used  . Alcohol Use: No     Comment: 1-2 beers at night - pt states he can't remember the last time he drank any alcohol  . Drug Use: No  . Sexual Activity: No   Other Topics Concern  . Not on file   Social History Narrative   Single. He has been engaged 4 times. No children He has cut his smoking back since he had tuberculosis. He has smoked more with recent pain. Sometimes 1 ppd, "it varies". Since last Thursday, he has had 2 beers. It does not taste right.  He was in the [employer]. Worked in Office manager, [place], and the post office.   FAMILY HISTORY: Family History  Problem Relation Age of Onset  . Pneumonia Father   . Stroke Mother    Mother living 38 years old at [employer] and rehabilitation center.  Father deceased at 75 years old of pneumonia. He drank and drank a lot. [employer] English as a second language teacher. Had a massive heart attack at 76 yo 1 sister, lives in [place]:  has No Known Allergies.  MEDICATIONS:  Current Outpatient Prescriptions  Medication Sig Dispense Refill  . aspirin EC 81 MG tablet Take 81 mg by mouth daily.    . feeding supplement, ENSURE ENLIVE, (ENSURE ENLIVE) LIQD Take 237 mLs by mouth 2 (two) times daily between meals. 60 Bottle 0  . Gemcitabine HCl (GEMZAR IV) Inject into the vein. To begin June 6    . hydrocerin (EUCERIN) CREA Apply 1  application topically 2 (two) times daily as needed (For dry skin.).    Marland Kitchen lidocaine-prilocaine (EMLA) cream Apply a quarter size amount to port site 1 hour prior to chemo. Do not rub in. Cover with plastic wrap. 30 g 3  . Menthol-Methyl Salicylate (THERA-GESIC) 1-15 % CREA Apply 1 application topically 4 (four) times daily as needed (For pain.).    Marland Kitchen Multiple Vitamins-Minerals (MULTIVITAMINS THER. W/MINERALS) TABS tablet Take 1 tablet by mouth daily. Reported on 12/09/2015    . ondansetron (ZOFRAN) 8 MG tablet Take 1 tablet (8 mg total) by mouth every 8 (eight) hours as needed for nausea or vomiting. 30 tablet 2  . oxyCODONE (OXYCONTIN) 15 mg 12 hr tablet Take 1 tablet (15 mg total) by mouth every 12 (  twelve) hours. 60 tablet 0  . Oxycodone HCl 10 MG TABS Take 1 tablet (10 mg total) by mouth every 4 (four) hours as needed. 90 tablet 0  . PACLitaxel Protein-Bound Part (ABRAXANE IV) Inject into the vein. To begin June 6    . polyethylene glycol (GOLYTELY) 236 g solution Take 4,000 mLs by mouth once. 4000 mL 0  . polyethylene glycol (MIRALAX / GLYCOLAX) packet Take 17 g by mouth daily. (Patient taking differently: Take 17 g by mouth every morning. ) 14 each 0  . prochlorperazine (COMPAZINE) 10 MG tablet Take 1 tablet (10 mg total) by mouth every 6 (six) hours as needed for nausea or vomiting. 30 tablet 2  . senna-docusate (SENOKOT S) 8.6-50 MG tablet Take 2 tablets by mouth daily.     No current facility-administered medications for this visit.    Review of Systems  HENT: Negative.   Eyes: Negative.   Respiratory: Positive for shortness of breath.        Chronic SOB   Cardiovascular: Negative.   Gastrointestinal: Positive for abdominal pain and constipation.       Constipation secondary to pain medication. Abdominal pain improved. Mucous-like stool  Genitourinary: Negative.   Musculoskeletal: Negative.   Skin: Positive for rash.       Rash secondary to chemotherapy  Neurological: Negative.     Endo/Heme/Allergies: Negative.   Psychiatric/Behavioral: The patient has insomnia. The patient is not nervous/anxious.        Insomnia secondary to abdominal pain/cramping.  All other systems reviewed and are negative.  14 point ROS was done and is otherwise as detailed above or in HPI   PHYSICAL EXAMINATION: ECOG PERFORMANCE STATUS: 2 - Symptomatic, <50% confined to bed  Filed Vitals:   01/07/16 0905  BP: 121/72  Pulse: 108  Temp: 98 F (36.7 C)  Resp: 20   Filed Weights   01/07/16 0905  Weight: 110 lb 4.8 oz (50.032 kg)    Physical Exam  Constitutional: He is oriented to person, place, and time.  Thin, cachexic, pleasant and appropriate.  Cigarettes in upper pocket  HENT:  Head: Normocephalic and atraumatic.  Mouth/Throat: Oropharynx is clear and moist.  Eyes: Conjunctivae and EOM are normal. Pupils are equal, round, and reactive to light. Right eye exhibits no discharge. Left eye exhibits no discharge. No scleral icterus.  Neck: Normal range of motion. Neck supple. No JVD present. No tracheal deviation present. No thyromegaly present.  Cardiovascular: Normal rate and regular rhythm.   Murmur heard. Pulmonary/Chest: Effort normal. No stridor.  Coarse breath sounds throughout  Abdominal: Soft. Bowel sounds are normal. He exhibits no distension and no mass. There is no tenderness. There is no rebound and no guarding.  Musculoskeletal: Normal range of motion. He exhibits no edema or tenderness.  Lymphadenopathy:    He has no cervical adenopathy.  Neurological: He is alert and oriented to person, place, and time. Gait normal.  Skin: Skin is warm and dry. No rash noted.  Psychiatric: Mood, memory, affect and judgment normal.  Nursing note and vitals reviewed.   LABORATORY DATA:  I have reviewed the data as listed Results for TARAN, HAYNESWORTH (MRN [id number])   Ref. Range 01/07/2016 09:35  Sodium Latest Ref Range: 135 - 145 mmol/L 132 (L)  Potassium Latest Ref Range:  3.5 - 5.1 mmol/L 4.1  Chloride Latest Ref Range: 101 - 111 mmol/L 101  CO2 Latest Ref Range: 22 - 32 mmol/L 25  BUN Latest Ref Range: 6 -  20 mg/dL 9  Creatinine Latest Ref Range: 0.61 - 1.24 mg/dL 0.48 (L)  Calcium Latest Ref Range: 8.9 - 10.3 mg/dL 8.6 (L)  EGFR (Non-African Amer.) Latest Ref Range: >60 mL/min >60  EGFR (African American) Latest Ref Range: >60 mL/min >60  Glucose Latest Ref Range: 65 - 99 mg/dL 159 (H)  Anion gap Latest Ref Range: 5 - 15  6  Alkaline Phosphatase Latest Ref Range: 38 - 126 U/L 68  Albumin Latest Ref Range: 3.5 - 5.0 g/dL 3.4 (L)  AST Latest Ref Range: 15 - 41 U/L 19  ALT Latest Ref Range: 17 - 63 U/L 19  Total Protein Latest Ref Range: 6.5 - 8.1 g/dL 6.8  Total Bilirubin Latest Ref Range: 0.3 - 1.2 mg/dL 0.4  WBC Latest Ref Range: 4.0 - 10.5 K/uL 8.3  RBC Latest Ref Range: 4.22 - 5.81 MIL/uL 3.35 (L)  Hemoglobin Latest Ref Range: 13.0 - 17.0 g/dL 11.0 (L)  HCT Latest Ref Range: 39.0 - 52.0 % 32.1 (L)  MCV Latest Ref Range: 78.0 - 100.0 fL 95.8  MCH Latest Ref Range: 26.0 - 34.0 pg 32.8  MCHC Latest Ref Range: 30.0 - 36.0 g/dL 34.3  RDW Latest Ref Range: 11.5 - 15.5 % 12.9  Platelets Latest Ref Range: 150 - 400 K/uL 184  Neutrophils Latest Units: % 75  Lymphocytes Latest Units: % 16  Monocytes Relative Latest Units: % 8  Eosinophil Latest Units: % 1  Basophil Latest Units: % 0  NEUT# Latest Ref Range: 1.7 - 7.7 K/uL 6.2  Lymphocyte # Latest Ref Range: 0.7 - 4.0 K/uL 1.3  Monocyte # Latest Ref Range: 0.1 - 1.0 K/uL 0.6  Eosinophils Absolute Latest Ref Range: 0.0 - 0.7 K/uL 0.1  Basophils Absolute Latest Ref Range: 0.0 - 0.1 K/uL 0.0   PATHOLOGY:     RADIOGRAPHIC STUDIES: I have personally reviewed the radiological images as listed and agreed with the findings in the report.  Study Result     CLINICAL DATA: History of chronic cough. New diagnosis of pancreatic cancer.  EXAM: CT CHEST WITH CONTRAST  TECHNIQUE: Multidetector CT  imaging of the chest was performed during intravenous contrast administration.  CONTRAST: 39m ISOVUE-300 IOPAMIDOL (ISOVUE-300) INJECTION 61%  COMPARISON: CT abdomen 11/22/2015, CT thorax 03/23/2012  FINDINGS: Mediastinum/Nodes: No axillary or supraclavicular adenopathy. No mediastinal hilar lymphadenopathy. No pericardial fluid. Esophagus is normal.  Lungs/Pleura: In comparison to CT exam of 03/23/2012 there is improvement in the left lower lobe peribronchial nodular thickening as well as endobronchial opacification. Peripheral cystic / cavitary change persists in the LEFT lower lobe. One cavitary lesion measuring 20 mm (image 57, series 3) at site of previously seen 3.5 cm thin-walled cavitary lesion. Smaller cavitary focus measuring 1.2 cm image 70, series 3 is new from 1.2 cm nodule of same site.  New nodule along the oblique fissure measuring 8 mm in the RIGHT lung (image 78, series 3.  On comparison exam there are multiple irregular nodules in the RIGHT upper lobe similar to the LEFT lower lobe. There is mild residual nodularity in the RIGHT upper lobe remaining (image 70, series 3.  Upper abdomen: Large pancreatic mass again demonstrated and partially imaged measuring up to 7.7 cm.  Musculoskeletal: Degenerative changes of the spine without aggressive osseous lesion.  IMPRESSION: 1. No convincing evidence of pulmonary metastasis. 2. Improvement in LEFT lower lobe and RIGHT upper lobe angular nodular thickening seen on comparison CT from 2013. Residual cavitation remains at the LEFT lung base. 3.  One nodule along the oblique fissure on the RIGHT measuring 8 mm is new from prior.   Electronically Signed  By: Suzy Bouchard M.D.  On: 12/04/2015 15:32    EUS:        ASSESSMENT & PLAN:  Adenocarcinoma of body/tail of pancreas, T4N0Mx History of TB, treated by [facility] History of MAC Weight Loss History  Hyponatremia Tobacco Abuse Cancer related pain Cancer Cachexia Hypokalemia  He presents today for D1C2 of ongoing gemzar/abraxane.  Weight is fairly stable, I am not convinced he eats as well as he says.  I have added remeron for sleep and hopefully it will help with appetite as well. Will also add pancreatic enzymes.   I have written for his oxycontin to be refilled this Friday, 7/7.  We will order repeat CT scans in about 6-8 weeks to assess response. Will also follow-up with CA 19-9.   He will return for treatment next week and follow up in 2 weeks.   All questions were answered. The patient knows to call the clinic with any problems, questions or concerns.  This document serves as a record of services personally performed by Ancil Linsey, MD. It was created on her behalf by Arlyce Harman, a trained medical scribe. The creation of this record is based on the scribe's personal observations and the provider's statements to them. This document has been checked and approved by the attending provider.  I have reviewed the above documentation for accuracy and completeness, and I agree with the above.  This note was electronically signed.    Molli Hazard, MD  01/07/2016 9:18 AM

## 2016-01-07 NOTE — Progress Notes (Signed)
1310:  Tolerated tx w/o adverse reaction.  A&Ox4, in no distress.  VSS.  Discharged ambulatory.

## 2016-01-07 NOTE — Patient Instructions (Signed)
[place] at [facility] Discharge Instructions  RECOMMENDATIONS MADE BY THE CONSULTANT AND ANY TEST RESULTS WILL BE SENT TO YOUR REFERRING PHYSICIAN.  You were seen by Dr Whitney Muse today. Return to clinic next week for chemo treatment and lab work. You will have lab work done today after your visit as well. Return to clinic in 2 weeks for follow-up visit, chemo treatment and lab work. Call clinic with any questions or concerns.   Thank you for choosing [place] at [facility] to provide your oncology and hematology care.  To afford each patient quality time with our provider, please arrive at least 15 minutes before your scheduled appointment time.   Beginning January 23rd 2017 lab work for the [facility] will be done in the  Main lab at [facility] on 1st floor. If you have a lab appointment with the [facility] please come in thru the  Main Entrance and check in at the main information desk  You need to re-schedule your appointment should you arrive 10 or more minutes late.  We strive to give you quality time with our providers, and arriving late affects you and other patients whose appointments are after yours.  Also, if you no show three or more times for appointments you may be dismissed from the clinic at the providers discretion.     Again, thank you for choosing [facility].  Our hope is that these requests will decrease the amount of time that you wait before being seen by our physicians.       _____________________________________________________________  Should you have questions after your visit to [facility], please contact our office at [phone] between the hours of 8:30 a.m. and 4:30 p.m.  Voicemails left after 4:30 p.m. will not be returned until the following business day.  For prescription refill requests, have your pharmacy contact our office.         Resources For Cancer Patients and  their Caregivers ? American Cancer Society: Can assist with transportation, wigs, general needs, runs Look Good Feel Better.        630 795 9559 ? Cancer Care: Provides financial assistance, online support groups, medication/co-pay assistance.  1-800-813-HOPE (573)571-2846) ? [facility] Assists [place] cancer patients and their families through emotional , educational and financial support.  (530) 237-7257 ? [place] DSS Where to apply for food stamps, Medicaid and utility assistance. 504-448-1523 ? [facility]: Transportation to medical appointments. 207-146-6333 ? Social Security Administration: May apply for disability if have a Stage IV cancer. 760-727-3692 725-057-3558 ? [employer], Disability and Transit Services: Assists with nutrition, care and transit needs. [place] Support Programs: @10RELATIVEDAYS @ > Cancer Support Group  2nd Tuesday of the month 1pm-2pm, Journey Room  > Creative Journey  3rd Tuesday of the month 1130am-1pm, Journey Room  > Look Good Feel Better  1st Wednesday of the month 10am-12 noon, Journey Room (Call [place] to register [phone])

## 2016-01-10 ENCOUNTER — Other Ambulatory Visit (HOSPITAL_COMMUNITY): Payer: Self-pay | Admitting: *Deleted

## 2016-01-14 ENCOUNTER — Telehealth (HOSPITAL_COMMUNITY): Payer: Self-pay | Admitting: *Deleted

## 2016-01-14 ENCOUNTER — Emergency Department (HOSPITAL_COMMUNITY): Payer: Medicaid Other

## 2016-01-14 ENCOUNTER — Ambulatory Visit (HOSPITAL_COMMUNITY): Payer: Medicaid Other

## 2016-01-14 ENCOUNTER — Encounter (HOSPITAL_COMMUNITY): Payer: Self-pay | Admitting: Emergency Medicine

## 2016-01-14 ENCOUNTER — Inpatient Hospital Stay (HOSPITAL_COMMUNITY)
Admission: EM | Admit: 2016-01-14 | Discharge: 2016-01-14 | DRG: 389 | Discharge status: Against Medical Advice | Payer: Medicaid Other | Attending: Family Medicine | Admitting: Family Medicine

## 2016-01-14 ENCOUNTER — Inpatient Hospital Stay (HOSPITAL_COMMUNITY): Payer: Medicaid Other

## 2016-01-14 ENCOUNTER — Other Ambulatory Visit (HOSPITAL_COMMUNITY): Payer: Medicaid Other

## 2016-01-14 DIAGNOSIS — C252 Malignant neoplasm of tail of pancreas: Secondary | ICD-10-CM | POA: Diagnosis present

## 2016-01-14 DIAGNOSIS — Z823 Family history of stroke: Secondary | ICD-10-CM | POA: Diagnosis not present

## 2016-01-14 DIAGNOSIS — R1084 Generalized abdominal pain: Secondary | ICD-10-CM | POA: Diagnosis present

## 2016-01-14 DIAGNOSIS — Z8611 Personal history of tuberculosis: Secondary | ICD-10-CM

## 2016-01-14 DIAGNOSIS — K5660 Unspecified intestinal obstruction: Principal | ICD-10-CM | POA: Diagnosis present

## 2016-01-14 DIAGNOSIS — K5669 Other intestinal obstruction: Secondary | ICD-10-CM

## 2016-01-14 DIAGNOSIS — Z85828 Personal history of other malignant neoplasm of skin: Secondary | ICD-10-CM | POA: Diagnosis not present

## 2016-01-14 DIAGNOSIS — Z9221 Personal history of antineoplastic chemotherapy: Secondary | ICD-10-CM

## 2016-01-14 DIAGNOSIS — G8929 Other chronic pain: Secondary | ICD-10-CM | POA: Diagnosis present

## 2016-01-14 DIAGNOSIS — E871 Hypo-osmolality and hyponatremia: Secondary | ICD-10-CM | POA: Diagnosis present

## 2016-01-14 DIAGNOSIS — K56609 Unspecified intestinal obstruction, unspecified as to partial versus complete obstruction: Secondary | ICD-10-CM

## 2016-01-14 DIAGNOSIS — R112 Nausea with vomiting, unspecified: Secondary | ICD-10-CM | POA: Diagnosis present

## 2016-01-14 DIAGNOSIS — C259 Malignant neoplasm of pancreas, unspecified: Secondary | ICD-10-CM

## 2016-01-14 DIAGNOSIS — Z7982 Long term (current) use of aspirin: Secondary | ICD-10-CM

## 2016-01-14 DIAGNOSIS — E860 Dehydration: Secondary | ICD-10-CM | POA: Diagnosis present

## 2016-01-14 DIAGNOSIS — F17210 Nicotine dependence, cigarettes, uncomplicated: Secondary | ICD-10-CM | POA: Diagnosis present

## 2016-01-14 LAB — COMPREHENSIVE METABOLIC PANEL
ALK PHOS: 70 U/L (ref 38–126)
ALT: 31 U/L (ref 17–63)
AST: 25 U/L (ref 15–41)
Albumin: 3.7 g/dL (ref 3.5–5.0)
Anion gap: 9 (ref 5–15)
BILIRUBIN TOTAL: 0.4 mg/dL (ref 0.3–1.2)
BUN: 8 mg/dL (ref 6–20)
CALCIUM: 9 mg/dL (ref 8.9–10.3)
CO2: 25 mmol/L (ref 22–32)
CREATININE: 0.43 mg/dL — AB (ref 0.61–1.24)
Chloride: 95 mmol/L — ABNORMAL LOW (ref 101–111)
GFR calc non Af Amer: >60 mL/min (ref >60)
Glucose, Bld: 122 mg/dL — ABNORMAL HIGH (ref 65–99)
Potassium: 3.8 mmol/L (ref 3.5–5.1)
SODIUM: 129 mmol/L — AB (ref 135–145)
TOTAL PROTEIN: 7.3 g/dL (ref 6.5–8.1)

## 2016-01-14 LAB — LACTIC ACID, PLASMA
Lactic Acid, Venous: 0.6 mmol/L (ref 0.5–1.9)
Lactic Acid, Venous: 0.6 mmol/L (ref 0.5–1.9)

## 2016-01-14 LAB — CBC WITH DIFFERENTIAL/PLATELET
Basophils Absolute: 0 10*3/uL (ref 0.0–0.1)
Basophils Relative: 0 %
EOS PCT: 1 %
Eosinophils Absolute: 0.1 10*3/uL (ref 0.0–0.7)
HEMATOCRIT: 32.9 % — AB (ref 39.0–52.0)
HEMOGLOBIN: 11.8 g/dL — AB (ref 13.0–17.0)
LYMPHS PCT: 32 %
Lymphs Abs: 2 10*3/uL (ref 0.7–4.0)
MCH: 33.4 pg (ref 26.0–34.0)
MCHC: 35.9 g/dL (ref 30.0–36.0)
MCV: 93.2 fL (ref 78.0–100.0)
Monocytes Absolute: 0.4 10*3/uL (ref 0.1–1.0)
Monocytes Relative: 6 %
Neutro Abs: 3.8 10*3/uL (ref 1.7–7.7)
Neutrophils Relative %: 61 %
Platelets: 96 10*3/uL — ABNORMAL LOW (ref 150–400)
RBC: 3.53 MIL/uL — ABNORMAL LOW (ref 4.22–5.81)
RDW: 12.6 % (ref 11.5–15.5)
SMEAR REVIEW: DECREASED
WBC: 6.2 10*3/uL (ref 4.0–10.5)

## 2016-01-14 MED ORDER — POLYETHYLENE GLYCOL 3350 17 G PO PACK
17.0000 g | PACK | Freq: Every day | ORAL | Status: DC
  Filled 2016-01-14: qty 1

## 2016-01-14 MED ORDER — HYDROMORPHONE HCL 1 MG/ML IJ SOLN
1.0000 mg | INTRAMUSCULAR | Status: DC | PRN
  Administered 2016-01-14: 1 mg via INTRAVENOUS
  Filled 2016-01-14: qty 1

## 2016-01-14 MED ORDER — HEPARIN SOD (PORK) LOCK FLUSH 100 UNIT/ML IV SOLN
500.0000 [IU] | INTRAVENOUS | Status: DC
  Administered 2016-01-14: 500 [IU]
  Filled 2016-01-14: qty 5

## 2016-01-14 MED ORDER — HYDROMORPHONE HCL 1 MG/ML IJ SOLN
1.0000 mg | Freq: Once | INTRAMUSCULAR | Status: AC
Start: 2016-01-14 — End: 2016-01-14
  Administered 2016-01-14: 1 mg via INTRAVENOUS

## 2016-01-14 MED ORDER — ONDANSETRON HCL 4 MG PO TABS: 4.0000 mg | ORAL_TABLET | Freq: Four times a day (QID) | ORAL | Status: DC | PRN

## 2016-01-14 MED ORDER — ONDANSETRON HCL 4 MG/2ML IJ SOLN
8.0000 mg | Freq: Once | INTRAMUSCULAR | Status: AC
  Administered 2016-01-14: 8 mg via INTRAVENOUS

## 2016-01-14 MED ORDER — ONDANSETRON HCL 4 MG/2ML IJ SOLN: 4.0000 mg | Freq: Four times a day (QID) | INTRAMUSCULAR | Status: DC | PRN

## 2016-01-14 MED ORDER — IOPAMIDOL (ISOVUE-300) INJECTION 61%
100.0000 mL | Freq: Once | INTRAVENOUS | Status: AC | PRN
  Administered 2016-01-14: 100 mL via INTRAVENOUS

## 2016-01-14 MED ORDER — HYDROMORPHONE HCL 2 MG/ML IJ SOLN
INTRAMUSCULAR | Status: AC
  Filled 2016-01-14: qty 1

## 2016-01-14 MED ORDER — MORPHINE SULFATE (PF) 2 MG/ML IV SOLN
4.0000 mg | Freq: Once | INTRAVENOUS | Status: AC
Start: 2016-01-14 — End: 2016-01-14
  Administered 2016-01-14: 4 mg via INTRAVENOUS
  Filled 2016-01-14: qty 2

## 2016-01-14 MED ORDER — PEG 3350-KCL-NA BICARB-NACL 420 G PO SOLR
4000.0000 mL | Freq: Once | ORAL | Status: AC
  Administered 2016-01-14: 4000 mL via ORAL
  Filled 2016-01-14: qty 4000

## 2016-01-14 MED ORDER — MORPHINE SULFATE (PF) 2 MG/ML IV SOLN
4.0000 mg | Freq: Once | INTRAVENOUS | Status: AC
  Administered 2016-01-14: 4 mg via INTRAVENOUS
  Filled 2016-01-14: qty 2

## 2016-01-14 MED ORDER — ONDANSETRON HCL 4 MG/2ML IJ SOLN
INTRAMUSCULAR | Status: AC
  Filled 2016-01-14: qty 4

## 2016-01-14 MED ORDER — BISACODYL 10 MG RE SUPP
10.0000 mg | Freq: Once | RECTAL | Status: AC
  Administered 2016-01-14: 10 mg via RECTAL
  Filled 2016-01-14: qty 1

## 2016-01-14 MED ORDER — HEPARIN SOD (PORK) LOCK FLUSH 100 UNIT/ML IV SOLN: 500.0000 [IU] | INTRAVENOUS | Status: DC | PRN

## 2016-01-14 MED ORDER — SODIUM CHLORIDE 0.9 % IV BOLUS (SEPSIS)
1000.0000 mL | Freq: Once | INTRAVENOUS | Status: AC
  Administered 2016-01-14: 1000 mL via INTRAVENOUS

## 2016-01-14 MED ORDER — HYDROMORPHONE HCL 2 MG/ML IJ SOLN
2.0000 mg | Freq: Once | INTRAMUSCULAR | Status: AC
  Administered 2016-01-14: 2 mg via INTRAVENOUS

## 2016-01-14 MED ORDER — DIATRIZOATE MEGLUMINE & SODIUM 66-10 % PO SOLN
ORAL | Status: AC
  Administered 2016-01-14: 06:00:00
  Filled 2016-01-14: qty 30

## 2016-01-14 MED ORDER — SODIUM CHLORIDE 0.9 % IV SOLN
INTRAVENOUS | Status: DC
  Administered 2016-01-14: 07:00:00 via INTRAVENOUS

## 2016-01-14 MED ORDER — HYDROMORPHONE HCL 1 MG/ML IJ SOLN
INTRAMUSCULAR | Status: AC
  Filled 2016-01-14: qty 1

## 2016-01-14 NOTE — Care Management Note (Signed)
Case Management Note  Patient Details  Name: Nicholas [name] MRN: [id number] Date of Birth: April 06, 1951  Subjective/Objective: Patient admitted from home with SBO. Ind with ADL's. Sister drives him to appointments. He reports going to [facility] for PCP, but switching to [facility]. Reports no issues obtaining medications. [facility] notified of patient status.               Action/Plan: Anticipate d/c home with self care. Will follow.  Expected Discharge Date:        01/15/2016          Expected Discharge Plan:  Home/Self Care  In-House Referral:  Clinical Social Work, NA  Discharge planning Services  CM Consult  Post Acute Care Choice:    Choice offered to:     DME Arranged:    DME Agency:     HH Arranged:    [place] Agency:     Status of Service:  Completed, signed off  If discussed at [employer], dates discussed:    Additional Comments:  Eva Vallee, Chauncey [place], RN 01/14/2016, 12:48 PM

## 2016-01-14 NOTE — Progress Notes (Signed)
Pt wants to leave AMA. Explained AMA to the patient and informed MD.

## 2016-01-14 NOTE — Progress Notes (Addendum)
Patient seen and examined PCP [facility] in [place] Dr Whitney Muse  65 y.o. male with medical history significant of pancreatic cancer diagnosed last couple of months on chemo comes in with sudden worsening generalized abdominal pain around 10pm with associated nausea and now vomiting in the ED. Pt has been constipated and thought this was due to his constipation. No flatus in hours, no BM in a day. No fevers. Has been loosing a lot of weight since his diagnosis. Has chronic abdominal pain since diagnosed but this is much worse than his usual. Pt found to have psbo on ct scan and referred for admission for treatment  Assessment and plan Small bowel obstruction-patient refused NG tube Continue IV fluids, conservative management Minimize narcotics if possible KUB today shows patient's distention of the small bowel with air-fluid levels consistent with small bowel obstruction CT scan shows transition zone in the right lower quadrant We'll consult surgery to get their input Lactic acid  0.6 Will try dulcolax supp, had a small BM this morning   Pancreatic cancer-Adenocarcinoma of body/tail of pancreas (T4N1M1) Stage IV, not felt to be a surgical candidate, with MRI imaging on 6//03/2016 demonstrating liver metastases, left renal mass, and retroperitoneal lymphadenopathy Currently on chemotherapy since 12/09/15. Platelets 96, white count 6.2,  Hyponatremia-likely secondary to dehydration/metastatic disease

## 2016-01-14 NOTE — Telephone Encounter (Signed)
Discussed with Anderson Malta.  She is advised that she is to come to the patient's follow-up appointment.  Robynn Pane, PA-C 01/14/2016 5:12 PM

## 2016-01-14 NOTE — Progress Notes (Unsigned)
Pt contacted re: he has yet to show for his 1000 appt for chemotherapy today.  Left message on answering machine notifying of his appt, and to call the clinic ASAP to let us know what is going on with him.   Pt was admitted to hospital with SBO today.

## 2016-01-14 NOTE — Telephone Encounter (Signed)
Spoke with sister brenda about the pt. Wanted an update.  Told sister to come to the next appt and she could meet with the Dr and have everything addressed at the appt

## 2016-01-14 NOTE — H&P (Signed)
History and Physical    Nicholas [name] [id number] DOB: 09/11/1950 DOA: 01/14/2016  PCP: PROVIDER NOT IN SYSTEM  Patient coming from: home  Chief Complaint: abdominal pain  HPI: Nicholas [name] is a 65 y.o. male with medical history significant of pancreatic cancer diagnosed last couple of months on chemo comes in with sudden worsening generalized abdominal pain around 10pm with associated nausea and now vomiting in the ED.  Pt has been constipated and thought this was due to his constipation.  No flatus in hours, no BM in a day.  No fevers.  Has been loosing a lot of weight since his diagnosis.  Has chronic abdominal pain since diagnosed but this is much worse than his usual.  Pt found to have psbo on ct scan and referred for admission for treatment.  Pt has been not found to be a surgical candidate thus far for his cancer per oncology notes.   He is suppose to get his chemo today.    ED Course: multiple doses of iv morphine with not much help  Review of Systems: As per HPI otherwise 10 point review of systems negative.   Past Medical History  Diagnosis Date  . Abdominal pain in male     "was told he has a Mass near pancreas"  . Cancer (HCC)     Skin cancer-ear, skin cancer -leeg,cancer of leg muscle- left., basal cell   . Pancreatic mass april 2017  . TB (pulmonary tuberculosis)     3 yrs ago-tx" positive TB test aslso" was told he is not contagious.treatment done for regulat TB.  Marland Kitchen Pancreatic cancer ([place]) 12/02/2015    Past Surgical History  Procedure Laterality Date  . Leg surgery Left     " cancer removed from muscle"  . Colonoscopy w/ polypectomy    . Eus N/A 11/25/2015    Procedure: ESOPHAGEAL ENDOSCOPIC ULTRASOUND (EUS) RADIAL;  Surgeon: Milus Banister, MD;  Location: [facility];  Service: Endoscopy;  Laterality: N/A;     reports that he has been smoking Cigarettes.  He has a 15 pack-year smoking history. He has never used smokeless tobacco. He reports that he  does not drink alcohol or use illicit drugs.  No Known Allergies  Family History  Problem Relation Age of Onset  . Pneumonia Father   . Stroke Mother     Prior to Admission medications   Medication Sig Start Date End Date Taking? Authorizing Provider  aspirin EC 81 MG tablet Take 81 mg by mouth daily.    Historical Provider, MD  feeding supplement, ENSURE ENLIVE, (ENSURE ENLIVE) LIQD Take 237 mLs by mouth 2 (two) times daily between meals. 11/25/15   Shanker Kristeen Mans, MD  Gemcitabine HCl (GEMZAR IV) Inject into the vein. To begin June 6    Historical Provider, MD  hydrocerin (EUCERIN) CREA Apply 1 application topically 2 (two) times daily as needed (For dry skin.).    Historical Provider, MD  lidocaine-prilocaine (EMLA) cream Apply a quarter size amount to port site 1 hour prior to chemo. Do not rub in. Cover with plastic wrap. 12/02/15   Patrici Ranks, MD  lipase/protease/amylase (CREON) 36000 UNITS CPEP capsule Take 1 capsule (36,000 Units total) by mouth 3 (three) times daily before meals. 01/07/16   Patrici Ranks, MD  Menthol-Methyl Salicylate (THERA-GESIC) 1-15 % CREA Apply 1 application topically 4 (four) times daily as needed (For pain.).    Historical Provider, MD  mirtazapine (REMERON) 15 MG tablet Take  on tablet po qhs for 5 days then increase to two tablets po qhs 01/07/16   Patrici Ranks, MD  Multiple Vitamins-Minerals (MULTIVITAMINS THER. W/MINERALS) TABS tablet Take 1 tablet by mouth daily. Reported on 12/09/2015    Historical Provider, MD  ondansetron (ZOFRAN) 8 MG tablet Take 1 tablet (8 mg total) by mouth every 8 (eight) hours as needed for nausea or vomiting. 12/02/15   Patrici Ranks, MD  oxyCODONE (OXYCONTIN) 15 mg 12 hr tablet Take 1 tablet (15 mg total) by mouth every 12 (twelve) hours. 01/07/16   Patrici Ranks, MD  Oxycodone HCl 10 MG TABS Take 1 tablet (10 mg total) by mouth every 4 (four) hours as needed. 12/30/15   Baird Cancer, PA-C  PACLitaxel  Protein-Bound Part (ABRAXANE IV) Inject into the vein. To begin June 6    Historical Provider, MD  polyethylene glycol (GOLYTELY) 236 g solution Take 4,000 mLs by mouth once. 12/22/15   Merryl Hacker, MD  polyethylene glycol (MIRALAX / GLYCOLAX) packet Take 17 g by mouth daily. Patient taking differently: Take 17 g by mouth every morning.  11/25/15   Jonetta Osgood, MD  prochlorperazine (COMPAZINE) 10 MG tablet Take 1 tablet (10 mg total) by mouth every 6 (six) hours as needed for nausea or vomiting. 12/02/15   Patrici Ranks, MD  senna-docusate (SENOKOT S) 8.6-50 MG tablet Take 2 tablets by mouth daily.    Historical Provider, MD    Physical Exam: Filed Vitals:   01/14/16 0130 01/14/16 0314 01/14/16 0317 01/14/16 0400  BP:  147/95  146/78  Pulse: 91 97  90  Temp:      Resp:   20   Height:      Weight:      SpO2: 92% 99%  93%      Constitutional: NAD, calm, comfortable Filed Vitals:   01/14/16 0130 01/14/16 0314 01/14/16 0317 01/14/16 0400  BP:  147/95  146/78  Pulse: 91 97  90  Temp:      Resp:   20   Height:      Weight:      SpO2: 92% 99%  93%   Eyes: PERRL, lids and conjunctivae normal ENMT: Mucous membranes are moist. Posterior pharynx clear of any exudate or lesions.Normal dentition.  Neck: normal, supple, no masses, no thyromegaly Respiratory: clear to auscultation bilaterally, no wheezing, no crackles. Normal respiratory effort. No accessory muscle use.  Cardiovascular: Regular rate and rhythm, no murmurs / rubs / gallops. No extremity edema. 2+ pedal pulses. No carotid bruits.  Abdomen:  Tenderness diffusely, no masses palpated. No hepatosplenomegaly. Bowel sounds positive. nondistended Musculoskeletal: no clubbing / cyanosis. No joint deformity upper and lower extremities. Good ROM, no contractures. Normal muscle tone.  Skin: no rashes, lesions, ulcers. No induration Neurologic: CN 2-12 grossly intact. Sensation intact, DTR normal. Strength 5/5 in all 4.    Psychiatric: Normal judgment and insight. Alert and oriented x 3. Normal mood.    Labs on Admission: I have personally reviewed following labs and imaging studies  CBC:  Recent Labs Lab 01/07/16 0935 01/14/16 0107  WBC 8.3 6.2  NEUTROABS 6.2 3.8  HGB 11.0* 11.8*  HCT 32.1* 32.9*  MCV 95.8 93.2  PLT 184 96*   Basic Metabolic Panel:  Recent Labs Lab 01/07/16 0935 01/14/16 0107  NA 132* 129*  K 4.1 3.8  CL 101 95*  CO2 25 25  GLUCOSE 159* 122*  BUN 9 8  CREATININE 0.48* 0.43*  CALCIUM 8.6* 9.0   GFR: Estimated Creatinine Clearance: 67.6 mL/min (by C-G formula based on Cr of 0.43). Liver Function Tests:  Recent Labs Lab 01/07/16 0935 01/14/16 0107  AST 19 25  ALT 19 31  ALKPHOS 68 70  BILITOT 0.4 0.4  PROT 6.8 7.3  ALBUMIN 3.4* 3.7   No results for input(s): LIPASE, AMYLASE in the last 168 hours. No results for input(s): AMMONIA in the last 168 hours. Coagulation Profile: No results for input(s): INR, PROTIME in the last 168 hours. Cardiac Enzymes: No results for input(s): CKTOTAL, CKMB, CKMBINDEX, TROPONINI in the last 168 hours. BNP (last 3 results) No results for input(s): PROBNP in the last 8760 hours. HbA1C: No results for input(s): HGBA1C in the last 72 hours. CBG: No results for input(s): GLUCAP in the last 168 hours. Lipid Profile: No results for input(s): CHOL, HDL, LDLCALC, TRIG, CHOLHDL, LDLDIRECT in the last 72 hours. Thyroid Function Tests: No results for input(s): TSH, T4TOTAL, FREET4, T3FREE, THYROIDAB in the last 72 hours. Anemia Panel: No results for input(s): VITAMINB12, FOLATE, FERRITIN, TIBC, IRON, RETICCTPCT in the last 72 hours. Urine analysis:    Component Value Date/Time   COLORURINE YELLOW 12/03/2015 0235   APPEARANCEUR HAZY* 12/03/2015 0235   LABSPEC <1.005* 12/03/2015 0235   PHURINE 7.0 12/03/2015 0235   GLUCOSEU NEGATIVE 12/03/2015 0235   HGBUR SMALL* 12/03/2015 0235   BILIRUBINUR NEGATIVE 12/03/2015 0235    KETONESUR NEGATIVE 12/03/2015 0235   PROTEINUR NEGATIVE 12/03/2015 0235   NITRITE NEGATIVE 12/03/2015 0235   LEUKOCYTESUR NEGATIVE 12/03/2015 0235   Sepsis Labs: !!!!!!!!!!!!!!!!!!!!!!!!!!!!!!!!!!!!!!!!!!!! @LABRCNTIP (procalcitonin:4,lacticidven:4) )No results found for this or any previous visit (from the past 240 hour(s)).   Radiological Exams on Admission: Ct Abdomen Pelvis W Contrast  01/14/2016  CLINICAL DATA:  Abdominal pain. Last bowel movement yesterday. History of pancreatic cancer. EXAM: CT ABDOMEN AND PELVIS WITH CONTRAST TECHNIQUE: Multidetector CT imaging of the abdomen and pelvis was performed using the standard protocol following bolus administration of intravenous contrast. CONTRAST:  174mL ISOVUE-300 IOPAMIDOL (ISOVUE-300) INJECTION 61% COMPARISON:  11/22/2015 FINDINGS: Lower esophageal wall thickening suggesting reflux disease. Calcified nodules in the left lung base again demonstrated. Diffuse emphysematous changes in the lungs. There is a hypo enhancing mass involving the body and tail of the pancreas, measuring about 3 x 6.7 cm. Margins of the lesion partially encircle the superior mesenteric artery with infiltration into the celiac axis. Adjacent lymph nodes are present. Changes are consistent with known pancreatic carcinoma and are similar to prior study. Focal hypo enhancing lesions demonstrated in the liver, largest measuring about 10 mm diameter. This is suspicious for hepatic metastasis. There is slight progression since previous study. Small amount of free fluid throughout the upper abdomen, extending along the pericolic gutters to the pelvis. This is likely reactive fluid or ascites. Fluid and gaseous distention of small bowel with decompressed terminal ileum. Transition zone is not well defined but appears to be in the right lower quadrant. Cause is not determined although mesenteric lymph nodes are present which may contribute. Calcification of the abdominal aorta. No  aneurysm. Suggestion of focal areas of stenosis demonstrated in the infrarenal abdominal aorta and in the proximal iliac arteries bilaterally. Adrenal glands, spleen, kidneys, and inferior vena cava are unremarkable. Pelvis: Bladder wall is mildly thickened, possibly due to under distention or cystitis. Appendix is not identified. No significant pelvic lymphadenopathy. Degenerative changes in the spine. No destructive bone lesions IMPRESSION: Mass in the body/ tail of the pancreas consistent with known pancreatic  carcinoma. Likely metastases to the liver demonstrating some progression since previous study. Diffuse free fluid throughout the abdomen and pelvis. Changes of small bowel obstruction with transition zone in the right lower quadrant. Etiology is not determined. Electronically Signed   By: Lucienne Capers M.D.   On: 01/14/2016 04:33   Dg Abd 2 Views  01/14/2016  CLINICAL DATA:  Severe abdominal pain since yesterday. History of TB, pancreatic cancer, smoker. EXAM: ABDOMEN - 2 VIEW COMPARISON:  MRI abdomen 12/11/2015.  Abdomen 12/03/2015. FINDINGS: Gaseous distention of multiple small bowel loops with air-fluid levels on the upright view. Changes consistent with small bowel obstruction. No free intra-abdominal air. No radiopaque stones. Degenerative changes in the spine. IMPRESSION: Gaseous distention of small bowel with air-fluid levels consistent with small bowel obstruction. Electronically Signed   By: Lucienne Capers M.D.   On: 01/14/2016 02:25    Assessment/Plan 65 yo male with pancreatic cancer now with SBO  Principal Problem:   SBO (small bowel obstruction) ([place])- pt is refusing NGT placement.  i have explained to him with ER nurse in room that this will likely help him with his pain and nausea, but he is still refusing.  Will give another dilaudid 2mg  iv now and zofran 8mg  iv now.  Keep npo.  Obtain surgical consult in the am.  Active Problems:   Abdominal pain, acute, generalized- due  to above, prn dilaudid and zofran ordered   Pancreatic cancer ([place])- noted   Nausea & vomiting- prn zofran   Pt is very cachectic compared to when i admitted his 2 months ago, has poor insight into the severity of his illness.  Wishes to be full code.  May benefit from palliative care consult prior to his discharge.  DVT prophylaxis: scds, holding lovenox in case surgical intervention is deemed necessary Code Status: full code  Halston Fairclough A MD [facility]  If 7PM-7AM, please contact night-coverage [email or web address] Password [facility]  01/14/2016, 5:38 AM

## 2016-01-14 NOTE — ED Notes (Signed)
Pt c/o abd pain and states his last bm was yesterday. Pt states he has taken his pain meds tonight with little relief.

## 2016-01-14 NOTE — Progress Notes (Signed)
Patient signed AMA papers and left with sister. Port deaccessed.

## 2016-01-14 NOTE — Consult Note (Signed)
Reason for Consult: Small bowel obstruction Referring Physician: Dr. Crista Curb is an 65 y.o. male.  HPI: Patient is a 65 year old white male with metastatic pancreatic cancer who presents with a 24-hour history of worsening abdominal pain and nausea. He states he has had similar episodes of partial bowel obstruction, which have resolved when taking GoLYTELY. He presented emergency room this morning as his abdominal cramping worsened. Since he has been admitted to the hospital, his had 3 small bowel movements. He states he is passing flatus frequently. He did have one episode of emesis but does not feel nauseated at the present time. His abdominal pain has resolved on its own. CT scan of the abdomen showed nonspecific bowel dilatation with possible transition zone in the distal small bowel. He does have feces and air in the colon. He has a large pancreatic mass without evidence of metastatic disease to the liver. He also has lymphadenopathy of the mesentery. He received chemotherapy 1 week ago and was due to have chemotherapy today, but that was canceled.  Past Medical History  Diagnosis Date  . Abdominal pain in male     "was told he has a Mass near pancreas"  . Cancer (HCC)     Skin cancer-ear, skin cancer -leeg,cancer of leg muscle- left., basal cell   . Pancreatic mass april 2017  . TB (pulmonary tuberculosis)     3 yrs ago-tx" positive TB test aslso" was told he is not contagious.treatment done for regulat TB.  Marland Kitchen Pancreatic cancer ([place]) 12/02/2015    Past Surgical History  Procedure Laterality Date  . Leg surgery Left     " cancer removed from muscle"  . Colonoscopy w/ polypectomy    . Eus N/A 11/25/2015    Procedure: ESOPHAGEAL ENDOSCOPIC ULTRASOUND (EUS) RADIAL;  Surgeon: Milus Banister, MD;  Location: [facility];  Service: Endoscopy;  Laterality: N/A;    Family History  Problem Relation Age of Onset  . Pneumonia Father   . Stroke Mother     Social History:   reports that he has been smoking Cigarettes.  He has a 15 pack-year smoking history. He has never used smokeless tobacco. He reports that he does not drink alcohol or use illicit drugs.  Allergies: No Known Allergies  Medications:  Prior to Admission:  Prescriptions prior to admission  Medication Sig Dispense Refill Last Dose  . aspirin EC 81 MG tablet Take 81 mg by mouth daily.   Taking  . feeding supplement, ENSURE ENLIVE, (ENSURE ENLIVE) LIQD Take 237 mLs by mouth 2 (two) times daily between meals. 60 Bottle 0 Taking  . Gemcitabine HCl (GEMZAR IV) Inject into the vein. To begin June 6   Taking  . hydrocerin (EUCERIN) CREA Apply 1 application topically 2 (two) times daily as needed (For dry skin.).   Taking  . lidocaine-prilocaine (EMLA) cream Apply a quarter size amount to port site 1 hour prior to chemo. Do not rub in. Cover with plastic wrap. 30 g 3 Taking  . lipase/protease/amylase (CREON) 36000 UNITS CPEP capsule Take 1 capsule (36,000 Units total) by mouth 3 (three) times daily before meals. 180 capsule 3   . Menthol-Methyl Salicylate (THERA-GESIC) 1-15 % CREA Apply 1 application topically 4 (four) times daily as needed (For pain.).   Taking  . mirtazapine (REMERON) 15 MG tablet Take on tablet po qhs for 5 days then increase to two tablets po qhs 60 tablet 3   . Multiple Vitamins-Minerals (MULTIVITAMINS THER. W/MINERALS) TABS  tablet Take 1 tablet by mouth daily. Reported on 12/09/2015   Taking  . ondansetron (ZOFRAN) 8 MG tablet Take 1 tablet (8 mg total) by mouth every 8 (eight) hours as needed for nausea or vomiting. 30 tablet 2 Taking  . oxyCODONE (OXYCONTIN) 15 mg 12 hr tablet Take 1 tablet (15 mg total) by mouth every 12 (twelve) hours. 60 tablet 0   . Oxycodone HCl 10 MG TABS Take 1 tablet (10 mg total) by mouth every 4 (four) hours as needed. 90 tablet 0 Taking  . PACLitaxel Protein-Bound Part (ABRAXANE IV) Inject into the vein. To begin June 6   Taking  . polyethylene glycol  (GOLYTELY) 236 g solution Take 4,000 mLs by mouth once. 4000 mL 0 Taking  . polyethylene glycol (MIRALAX / GLYCOLAX) packet Take 17 g by mouth daily. (Patient taking differently: Take 17 g by mouth every morning. ) 14 each 0 Taking  . prochlorperazine (COMPAZINE) 10 MG tablet Take 1 tablet (10 mg total) by mouth every 6 (six) hours as needed for nausea or vomiting. 30 tablet 2 Taking  . senna-docusate (SENOKOT S) 8.6-50 MG tablet Take 2 tablets by mouth daily.   Taking   Scheduled:   Results for orders placed or performed during the hospital encounter of 01/14/16 (from the past 48 hour(s))  CBC with Differential     Status: Abnormal   Collection Time: 01/14/16  1:07 AM  Result Value Ref Range   WBC 6.2 4.0 - 10.5 K/uL   RBC 3.53 (L) 4.22 - 5.81 MIL/uL   Hemoglobin 11.8 (L) 13.0 - 17.0 g/dL   HCT 32.9 (L) 39.0 - 52.0 %   MCV 93.2 78.0 - 100.0 fL   MCH 33.4 26.0 - 34.0 pg   MCHC 35.9 30.0 - 36.0 g/dL   RDW 12.6 11.5 - 15.5 %   Platelets 96 (L) 150 - 400 K/uL    Comment: REPEATED TO VERIFY SPECIMEN CHECKED FOR CLOTS    Neutrophils Relative % 61 %   Neutro Abs 3.8 1.7 - 7.7 K/uL   Lymphocytes Relative 32 %   Lymphs Abs 2.0 0.7 - 4.0 K/uL   Monocytes Relative 6 %   Monocytes Absolute 0.4 0.1 - 1.0 K/uL   Eosinophils Relative 1 %   Eosinophils Absolute 0.1 0.0 - 0.7 K/uL   Basophils Relative 0 %   Basophils Absolute 0.0 0.0 - 0.1 K/uL   Smear Review PLATELETS APPEAR DECREASED   Comprehensive metabolic panel     Status: Abnormal   Collection Time: 01/14/16  1:07 AM  Result Value Ref Range   Sodium 129 (L) 135 - 145 mmol/L   Potassium 3.8 3.5 - 5.1 mmol/L   Chloride 95 (L) 101 - 111 mmol/L   CO2 25 22 - 32 mmol/L   Glucose, Bld 122 (H) 65 - 99 mg/dL   BUN 8 6 - 20 mg/dL   Creatinine, Ser 0.43 (L) 0.61 - 1.24 mg/dL   Calcium 9.0 8.9 - 10.3 mg/dL   Total Protein 7.3 6.5 - 8.1 g/dL   Albumin 3.7 3.5 - 5.0 g/dL   AST 25 15 - 41 U/L   ALT 31 17 - 63 U/L   Alkaline Phosphatase 70  38 - 126 U/L   Total Bilirubin 0.4 0.3 - 1.2 mg/dL   GFR calc non Af Amer >60 >60 mL/min   GFR calc Af Amer >60 >60 mL/min    Comment: (NOTE) The eGFR has been calculated using the CKD EPI equation.  This calculation has not been validated in all clinical situations. eGFR's persistently <60 mL/min signify possible Chronic Kidney Disease.    Anion gap 9 5 - 15  Lactic acid, plasma     Status: None   Collection Time: 01/14/16  6:08 AM  Result Value Ref Range   Lactic Acid, Venous 0.6 0.5 - 1.9 mmol/L  Lactic acid, plasma     Status: None   Collection Time: 01/14/16  9:46 AM  Result Value Ref Range   Lactic Acid, Venous 0.6 0.5 - 1.9 mmol/L    Ct Abdomen Pelvis W Contrast  01/14/2016  CLINICAL DATA:  Abdominal pain. Last bowel movement yesterday. History of pancreatic cancer. EXAM: CT ABDOMEN AND PELVIS WITH CONTRAST TECHNIQUE: Multidetector CT imaging of the abdomen and pelvis was performed using the standard protocol following bolus administration of intravenous contrast. CONTRAST:  153m ISOVUE-300 IOPAMIDOL (ISOVUE-300) INJECTION 61% COMPARISON:  11/22/2015 FINDINGS: Lower esophageal wall thickening suggesting reflux disease. Calcified nodules in the left lung base again demonstrated. Diffuse emphysematous changes in the lungs. There is a hypo enhancing mass involving the body and tail of the pancreas, measuring about 3 x 6.7 cm. Margins of the lesion partially encircle the superior mesenteric artery with infiltration into the celiac axis. Adjacent lymph nodes are present. Changes are consistent with known pancreatic carcinoma and are similar to prior study. Focal hypo enhancing lesions demonstrated in the liver, largest measuring about 10 mm diameter. This is suspicious for hepatic metastasis. There is slight progression since previous study. Small amount of free fluid throughout the upper abdomen, extending along the pericolic gutters to the pelvis. This is likely reactive fluid or ascites.  Fluid and gaseous distention of small bowel with decompressed terminal ileum. Transition zone is not well defined but appears to be in the right lower quadrant. Cause is not determined although mesenteric lymph nodes are present which may contribute. Calcification of the abdominal aorta. No aneurysm. Suggestion of focal areas of stenosis demonstrated in the infrarenal abdominal aorta and in the proximal iliac arteries bilaterally. Adrenal glands, spleen, kidneys, and inferior vena cava are unremarkable. Pelvis: Bladder wall is mildly thickened, possibly due to under distention or cystitis. Appendix is not identified. No significant pelvic lymphadenopathy. Degenerative changes in the spine. No destructive bone lesions IMPRESSION: Mass in the body/ tail of the pancreas consistent with known pancreatic carcinoma. Likely metastases to the liver demonstrating some progression since previous study. Diffuse free fluid throughout the abdomen and pelvis. Changes of small bowel obstruction with transition zone in the right lower quadrant. Etiology is not determined. Electronically Signed   By: WLucienne CapersM.D.   On: 01/14/2016 04:33   Dg Abd 2 Views  01/14/2016  CLINICAL DATA:  Severe abdominal pain since yesterday. History of TB, pancreatic cancer, smoker. EXAM: ABDOMEN - 2 VIEW COMPARISON:  MRI abdomen 12/11/2015.  Abdomen 12/03/2015. FINDINGS: Gaseous distention of multiple small bowel loops with air-fluid levels on the upright view. Changes consistent with small bowel obstruction. No free intra-abdominal air. No radiopaque stones. Degenerative changes in the spine. IMPRESSION: Gaseous distention of small bowel with air-fluid levels consistent with small bowel obstruction. Electronically Signed   By: WLucienne CapersM.D.   On: 01/14/2016 02:25    ROS:  Pertinent items noted in HPI and remainder of comprehensive ROS otherwise negative.  Blood pressure 159/79, pulse 91, temperature 97.8 F (36.6 C),  temperature source Oral, resp. rate 20, height _0  (1.803 m), weight 50.894 kg (112 lb 3.2 oz), SpO2  97 %. Physical Exam: Pleasant but somewhat cachectic white male in no acute distress. Neck is supple without lymphadenopathy. Head is normocephalic, atraumatic. Lungs clear to auscultation with equal breath sounds bilaterally. Heart examination reveals a regular rate and rhythm without S3, S4, murmurs. Abdomen is soft but somewhat distended. Active bowel sounds are appreciated. No rigidity is noted. I could not appreciate hepatosplenomegaly.  Assessment/Plan: Impression: Partial small bowel obstruction, possible bowel dysfunction versus mechanical in nature. No need for acute surgical intervention. Patient would like to be discharged as he is starting to move his bowels and feels better. I told him that we should start MiraLAX. He does have a bowel regimen that he takes at home. Will follow peripherally with you.  Jermon Chalfant A 01/14/2016, 2:06 PM

## 2016-01-14 NOTE — ED Provider Notes (Signed)
CSN: [id number]     Arrival date & time 01/14/16  0052 History   First MD Initiated Contact with Patient 01/14/16 0135 AM    Chief Complaint  Patient presents with  . Abdominal Pain     (Consider location/radiation/quality/duration/timing/severity/associated sxs/prior Treatment) HPI patient reports he has been having problems of constipation. He states he's been taking MiraLAX and some other laxative tablet. He states he was having normal bowel movements all week however he did not have a bowel movement today. He does report he was having falls last week but earlier this week they were soft. He states about 10:30 tonight he started having abdominal discomfort. He had nausea without vomiting. He states his abdomen swelling. He states he's having reflux. He denies any urinary symptoms such as dysuria frequency. Patient keeps repeating over and over again "this feels like I'm constipated". Patient also demanding "just put me to sleep and when I wake up I'll have a bowel movement". He states he's had a normal appetite up until today. He states the last time they gave him a jug of medicine and that helped his constipation.   PCP [facility] in [place]  Past Medical History  Diagnosis Date  . Abdominal pain in male     "was told he has a Mass near pancreas"  . Cancer (HCC)     Skin cancer-ear, skin cancer -leeg,cancer of leg muscle- left., basal cell   . Pancreatic mass april 2017  . TB (pulmonary tuberculosis)     3 yrs ago-tx" positive TB test aslso" was told he is not contagious.treatment done for regulat TB.  Marland Kitchen Pancreatic cancer ([place]) 12/02/2015   Past Surgical History  Procedure Laterality Date  . Leg surgery Left     " cancer removed from muscle"  . Colonoscopy w/ polypectomy    . Eus N/A 11/25/2015    Procedure: ESOPHAGEAL ENDOSCOPIC ULTRASOUND (EUS) RADIAL;  Surgeon: Milus Banister, MD;  Location: [facility];  Service: Endoscopy;  Laterality: N/A;   Family  History  Problem Relation Age of Onset  . Pneumonia Father   . Stroke Mother    Social History  Substance Use Topics  . Smoking status: Current Every Day Smoker -- 0.50 packs/day for 30 years    Types: Cigarettes  . Smokeless tobacco: Never Used  . Alcohol Use: No     Comment: 1-2 beers at night - pt states he can't remember the last time he drank any alcohol    Review of Systems  All other systems reviewed and are negative.     Allergies  Review of patient's allergies indicates no known allergies.  Home Medications   Prior to Admission medications   Medication Sig Start Date End Date Taking? Authorizing Provider  aspirin EC 81 MG tablet Take 81 mg by mouth daily.    Historical Provider, MD  feeding supplement, ENSURE ENLIVE, (ENSURE ENLIVE) LIQD Take 237 mLs by mouth 2 (two) times daily between meals. 11/25/15   Shanker Kristeen Mans, MD  Gemcitabine HCl (GEMZAR IV) Inject into the vein. To begin June 6    Historical Provider, MD  hydrocerin (EUCERIN) CREA Apply 1 application topically 2 (two) times daily as needed (For dry skin.).    Historical Provider, MD  lidocaine-prilocaine (EMLA) cream Apply a quarter size amount to port site 1 hour prior to chemo. Do not rub in. Cover with plastic wrap. 12/02/15   Patrici Ranks, MD  lipase/protease/amylase (CREON) 36000 UNITS CPEP capsule Take  1 capsule (36,000 Units total) by mouth 3 (three) times daily before meals. 01/07/16   Patrici Ranks, MD  Menthol-Methyl Salicylate (THERA-GESIC) 1-15 % CREA Apply 1 application topically 4 (four) times daily as needed (For pain.).    Historical Provider, MD  mirtazapine (REMERON) 15 MG tablet Take on tablet po qhs for 5 days then increase to two tablets po qhs 01/07/16   Patrici Ranks, MD  Multiple Vitamins-Minerals (MULTIVITAMINS THER. W/MINERALS) TABS tablet Take 1 tablet by mouth daily. Reported on 12/09/2015    Historical Provider, MD  ondansetron (ZOFRAN) 8 MG tablet Take 1 tablet (8 mg total)  by mouth every 8 (eight) hours as needed for nausea or vomiting. 12/02/15   Patrici Ranks, MD  oxyCODONE (OXYCONTIN) 15 mg 12 hr tablet Take 1 tablet (15 mg total) by mouth every 12 (twelve) hours. 01/07/16   Patrici Ranks, MD  Oxycodone HCl 10 MG TABS Take 1 tablet (10 mg total) by mouth every 4 (four) hours as needed. 12/30/15   Baird Cancer, PA-C  PACLitaxel Protein-Bound Part (ABRAXANE IV) Inject into the vein. To begin June 6    Historical Provider, MD  polyethylene glycol (GOLYTELY) 236 g solution Take 4,000 mLs by mouth once. 12/22/15   Merryl Hacker, MD  polyethylene glycol (MIRALAX / GLYCOLAX) packet Take 17 g by mouth daily. Patient taking differently: Take 17 g by mouth every morning.  11/25/15   Jonetta Osgood, MD  prochlorperazine (COMPAZINE) 10 MG tablet Take 1 tablet (10 mg total) by mouth every 6 (six) hours as needed for nausea or vomiting. 12/02/15   Patrici Ranks, MD  senna-docusate (SENOKOT S) 8.6-50 MG tablet Take 2 tablets by mouth daily.    Historical Provider, MD   BP 146/78 mmHg  Pulse 90  Temp(Src) 97.5 F (36.4 C)  Resp 20  Ht 5\' 11"  (1.803 m)  Wt 110 lb (49.896 kg)  BMI 15.35 kg/m2  SpO2 93%  Vital signs normal   Physical Exam  Constitutional: He is oriented to person, place, and time.  Non-toxic appearance. He does not appear ill. He appears distressed.  Frail male who appears much older than his stated age  HENT:  Head: Normocephalic and atraumatic.  Right Ear: External ear normal.  Left Ear: External ear normal.  Nose: Nose normal. No mucosal edema or rhinorrhea.  Mouth/Throat: Mucous membranes are normal. No dental abscesses or uvula swelling.  Eyes: Conjunctivae and EOM are normal. Pupils are equal, round, and reactive to light.  Neck: Normal range of motion and full passive range of motion without pain. Neck supple.  Cardiovascular: Normal rate, regular rhythm and normal heart sounds.  Exam reveals no gallop and no friction rub.   No  murmur heard. Pulmonary/Chest: Effort normal and breath sounds normal. No respiratory distress. He has no wheezes. He has no rhonchi. He has no rales. He exhibits no tenderness and no crepitus.  Abdominal: Soft. Normal appearance and bowel sounds are normal. He exhibits distension. There is generalized tenderness. There is no rebound and no guarding.  Musculoskeletal: Normal range of motion. He exhibits no edema or tenderness.  Moves all extremities well.   Neurological: He is alert and oriented to person, place, and time. He has normal strength. No cranial nerve deficit.  Skin: Skin is warm, dry and intact. No rash noted. No erythema. No pallor.  Psychiatric: He has a normal mood and affect. His speech is normal and behavior is normal. His mood  appears not anxious.  Nursing note and vitals reviewed.   ED Course  Procedures (including critical care time)  Medications  diatrizoate meglumine-sodium (GASTROGRAFIN) 66-10 % solution (not administered)  polyethylene glycol-electrolytes (NuLYTELY/GoLYTELY) solution 4,000 mL (4,000 mLs Oral Given 01/14/16 0152)  morphine 2 MG/ML injection 4 mg (4 mg Intravenous Given 01/14/16 0152)  sodium chloride 0.9 % bolus 1,000 mL (1,000 mLs Intravenous New Bag/Given 01/14/16 0316)  morphine 2 MG/ML injection 4 mg (4 mg Intravenous Given 01/14/16 0316)  iopamidol (ISOVUE-300) 61 % injection 100 mL (100 mLs Intravenous Contrast Given 01/14/16 0413)  HYDROmorphone (DILAUDID) injection 1 mg (1 mg Intravenous Given 01/14/16 0452)   Patient keeps repeating over and over again that he is constipated and to "knock me out" and when he wakes up he'll have a bowel movement. He did finally consent to get x-rays to see if he was constipated. We also discussed that narcotic pain medicine would make constipation worse.  Priya 6 AM after reviewing these x-rays patient was started on morphine which she states has helped in the past. We discussed his x-ray shows he has a blockage.  He is agreeable to getting a CT scan.  Recheck at 5:45 AM patient started having vomiting after drinking the contrast. His pain seems to be worse after having the oral contrast. He was given a dilaudid for his pain. He was given the results of his CT scan and the need for admission.   05:10 AM Dr Shanon Brow, hospitalist, admit to [facility] Review Results for orders placed or performed during the hospital encounter of 01/14/16  CBC with Differential  Result Value Ref Range   WBC 6.2 4.0 - 10.5 K/uL   RBC 3.53 (L) 4.22 - 5.81 MIL/uL   Hemoglobin 11.8 (L) 13.0 - 17.0 g/dL   HCT 32.9 (L) 39.0 - 52.0 %   MCV 93.2 78.0 - 100.0 fL   MCH 33.4 26.0 - 34.0 pg   MCHC 35.9 30.0 - 36.0 g/dL   RDW 12.6 11.5 - 15.5 %   Platelets 96 (L) 150 - 400 K/uL   Neutrophils Relative % 61 %   Neutro Abs 3.8 1.7 - 7.7 K/uL   Lymphocytes Relative 32 %   Lymphs Abs 2.0 0.7 - 4.0 K/uL   Monocytes Relative 6 %   Monocytes Absolute 0.4 0.1 - 1.0 K/uL   Eosinophils Relative 1 %   Eosinophils Absolute 0.1 0.0 - 0.7 K/uL   Basophils Relative 0 %   Basophils Absolute 0.0 0.0 - 0.1 K/uL   Smear Review PLATELETS APPEAR DECREASED   Comprehensive metabolic panel  Result Value Ref Range   Sodium 129 (L) 135 - 145 mmol/L   Potassium 3.8 3.5 - 5.1 mmol/L   Chloride 95 (L) 101 - 111 mmol/L   CO2 25 22 - 32 mmol/L   Glucose, Bld 122 (H) 65 - 99 mg/dL   BUN 8 6 - 20 mg/dL   Creatinine, Ser 0.43 (L) 0.61 - 1.24 mg/dL   Calcium 9.0 8.9 - 10.3 mg/dL   Total Protein 7.3 6.5 - 8.1 g/dL   Albumin 3.7 3.5 - 5.0 g/dL   AST 25 15 - 41 U/L   ALT 31 17 - 63 U/L   Alkaline Phosphatase 70 38 - 126 U/L   Total Bilirubin 0.4 0.3 - 1.2 mg/dL   GFR calc non Af Amer >60 >60 mL/min   GFR calc Af Amer >60 >60 mL/min   Anion gap 9 5 -  15    Laboratory interpretation all normal except Hyponatremia, mild anemia    Imaging Review Ct Abdomen Pelvis W Contrast  01/14/2016  CLINICAL DATA:  Abdominal pain. Last bowel movement  yesterday. History of pancreatic cancer. EXAM: CT ABDOMEN AND PELVIS WITH CONTRAST TECHNIQUE: Multidetector CT imaging of the abdomen and pelvis was performed using the standard protocol following bolus administration of intravenous contrast. CONTRAST:  118mL ISOVUE-300 IOPAMIDOL (ISOVUE-300) INJECTION 61% COMPARISON:  11/22/2015 FINDINGS: Lower esophageal wall thickening suggesting reflux disease. Calcified nodules in the left lung base again demonstrated. Diffuse emphysematous changes in the lungs. There is a hypo enhancing mass involving the body and tail of the pancreas, measuring about 3 x 6.7 cm. Margins of the lesion partially encircle the superior mesenteric artery with infiltration into the celiac axis. Adjacent lymph nodes are present. Changes are consistent with known pancreatic carcinoma and are similar to prior study. Focal hypo enhancing lesions demonstrated in the liver, largest measuring about 10 mm diameter. This is suspicious for hepatic metastasis. There is slight progression since previous study. Small amount of free fluid throughout the upper abdomen, extending along the pericolic gutters to the pelvis. This is likely reactive fluid or ascites. Fluid and gaseous distention of small bowel with decompressed terminal ileum. Transition zone is not well defined but appears to be in the right lower quadrant. Cause is not determined although mesenteric lymph nodes are present which may contribute. Calcification of the abdominal aorta. No aneurysm. Suggestion of focal areas of stenosis demonstrated in the infrarenal abdominal aorta and in the proximal iliac arteries bilaterally. Adrenal glands, spleen, kidneys, and inferior vena cava are unremarkable. Pelvis: Bladder wall is mildly thickened, possibly due to under distention or cystitis. Appendix is not identified. No significant pelvic lymphadenopathy. Degenerative changes in the spine. No destructive bone lesions IMPRESSION: Mass in the body/ tail of  the pancreas consistent with known pancreatic carcinoma. Likely metastases to the liver demonstrating some progression since previous study. Diffuse free fluid throughout the abdomen and pelvis. Changes of small bowel obstruction with transition zone in the right lower quadrant. Etiology is not determined. Electronically Signed   By: Lucienne Capers M.D.   On: 01/14/2016 04:33   Dg Abd 2 Views  01/14/2016  CLINICAL DATA:  Severe abdominal pain since yesterday. History of TB, pancreatic cancer, smoker. EXAM: ABDOMEN - 2 VIEW COMPARISON:  MRI abdomen 12/11/2015.  Abdomen 12/03/2015. FINDINGS: Gaseous distention of multiple small bowel loops with air-fluid levels on the upright view. Changes consistent with small bowel obstruction. No free intra-abdominal air. No radiopaque stones. Degenerative changes in the spine. IMPRESSION: Gaseous distention of small bowel with air-fluid levels consistent with small bowel obstruction. Electronically Signed   By: Lucienne Capers M.D.   On: 01/14/2016 02:25   I have personally reviewed and evaluated these images and lab results as part of my medical decision-making.    MDM   Final diagnoses:  SBO (small bowel obstruction) ([place])    Plan admission   Rolland Porter, MD, Barbette Or, MD 01/14/16 [phone]

## 2016-01-19 ENCOUNTER — Telehealth (HOSPITAL_COMMUNITY): Payer: Self-pay | Admitting: *Deleted

## 2016-01-19 ENCOUNTER — Other Ambulatory Visit (HOSPITAL_COMMUNITY): Payer: Self-pay | Admitting: Oncology

## 2016-01-19 DIAGNOSIS — K8689 Other specified diseases of pancreas: Secondary | ICD-10-CM

## 2016-01-19 DIAGNOSIS — K769 Liver disease, unspecified: Secondary | ICD-10-CM

## 2016-01-19 DIAGNOSIS — C251 Malignant neoplasm of body of pancreas: Secondary | ICD-10-CM

## 2016-01-19 MED ORDER — OXYCODONE HCL 10 MG PO TABS: 10.0000 mg | ORAL_TABLET | ORAL | Status: DC | PRN

## 2016-01-19 NOTE — Telephone Encounter (Signed)
printed

## 2016-01-21 ENCOUNTER — Encounter: Payer: Self-pay | Admitting: Dietician

## 2016-01-21 ENCOUNTER — Encounter (HOSPITAL_BASED_OUTPATIENT_CLINIC_OR_DEPARTMENT_OTHER): Payer: Medicaid Other

## 2016-01-21 ENCOUNTER — Inpatient Hospital Stay (HOSPITAL_COMMUNITY): Payer: Medicaid Other

## 2016-01-21 ENCOUNTER — Encounter (HOSPITAL_BASED_OUTPATIENT_CLINIC_OR_DEPARTMENT_OTHER): Payer: Medicaid Other | Admitting: Hematology & Oncology

## 2016-01-21 ENCOUNTER — Encounter (HOSPITAL_COMMUNITY): Payer: Self-pay | Admitting: Hematology & Oncology

## 2016-01-21 ENCOUNTER — Encounter (HOSPITAL_COMMUNITY): Payer: Medicaid Other

## 2016-01-21 VITALS — BP 139/85 | HR 98 | Temp 98.4°F | Resp 16 | Wt 111.6 lb

## 2016-01-21 VITALS — BP 128/73 | HR 80 | Temp 98.1°F | Resp 18

## 2016-01-21 DIAGNOSIS — E876 Hypokalemia: Secondary | ICD-10-CM | POA: Diagnosis present

## 2016-01-21 DIAGNOSIS — Z8611 Personal history of tuberculosis: Secondary | ICD-10-CM | POA: Diagnosis present

## 2016-01-21 DIAGNOSIS — C259 Malignant neoplasm of pancreas, unspecified: Secondary | ICD-10-CM | POA: Diagnosis not present

## 2016-01-21 DIAGNOSIS — Z720 Tobacco use: Secondary | ICD-10-CM

## 2016-01-21 DIAGNOSIS — C258 Malignant neoplasm of overlapping sites of pancreas: Secondary | ICD-10-CM

## 2016-01-21 DIAGNOSIS — C787 Secondary malignant neoplasm of liver and intrahepatic bile duct: Secondary | ICD-10-CM | POA: Diagnosis not present

## 2016-01-21 DIAGNOSIS — Z5111 Encounter for antineoplastic chemotherapy: Secondary | ICD-10-CM

## 2016-01-21 DIAGNOSIS — K869 Disease of pancreas, unspecified: Secondary | ICD-10-CM | POA: Diagnosis present

## 2016-01-21 DIAGNOSIS — A310 Pulmonary mycobacterial infection: Secondary | ICD-10-CM

## 2016-01-21 DIAGNOSIS — K5900 Constipation, unspecified: Secondary | ICD-10-CM | POA: Diagnosis not present

## 2016-01-21 DIAGNOSIS — K7689 Other specified diseases of liver: Secondary | ICD-10-CM | POA: Diagnosis present

## 2016-01-21 DIAGNOSIS — R143 Flatulence: Secondary | ICD-10-CM | POA: Diagnosis present

## 2016-01-21 DIAGNOSIS — C251 Malignant neoplasm of body of pancreas: Secondary | ICD-10-CM | POA: Diagnosis present

## 2016-01-21 LAB — CBC WITH DIFFERENTIAL/PLATELET
BASOS ABS: 0 10*3/uL (ref 0.0–0.1)
Basophils Relative: 0 %
EOS ABS: 0.2 10*3/uL (ref 0.0–0.7)
EOS PCT: 2 %
HCT: 33.4 % — ABNORMAL LOW (ref 39.0–52.0)
Hemoglobin: 11.6 g/dL — ABNORMAL LOW (ref 13.0–17.0)
LYMPHS PCT: 14 %
Lymphs Abs: 1.6 10*3/uL (ref 0.7–4.0)
MCH: 33.1 pg (ref 26.0–34.0)
MCHC: 34.7 g/dL (ref 30.0–36.0)
MCV: 95.4 fL (ref 78.0–100.0)
Monocytes Absolute: 1.1 10*3/uL — ABNORMAL HIGH (ref 0.1–1.0)
Monocytes Relative: 10 %
Neutro Abs: 8.4 10*3/uL — ABNORMAL HIGH (ref 1.7–7.7)
Neutrophils Relative %: 74 %
PLATELETS: 409 10*3/uL — AB (ref 150–400)
RBC: 3.5 MIL/uL — AB (ref 4.22–5.81)
RDW: 14.8 % (ref 11.5–15.5)
WBC: 11.3 10*3/uL — AB (ref 4.0–10.5)

## 2016-01-21 LAB — COMPREHENSIVE METABOLIC PANEL
ALK PHOS: 67 U/L (ref 38–126)
ALT: 18 U/L (ref 17–63)
AST: 21 U/L (ref 15–41)
Albumin: 3.6 g/dL (ref 3.5–5.0)
Anion gap: 5 (ref 5–15)
BUN: 8 mg/dL (ref 6–20)
CALCIUM: 8.6 mg/dL — AB (ref 8.9–10.3)
CHLORIDE: 99 mmol/L — AB (ref 101–111)
CO2: 27 mmol/L (ref 22–32)
CREATININE: 0.59 mg/dL — AB (ref 0.61–1.24)
GFR calc non Af Amer: >60 mL/min (ref >60)
GLUCOSE: 181 mg/dL — AB (ref 65–99)
Potassium: 4.2 mmol/L (ref 3.5–5.1)
SODIUM: 131 mmol/L — AB (ref 135–145)
Total Bilirubin: 0.3 mg/dL (ref 0.3–1.2)
Total Protein: 6.9 g/dL (ref 6.5–8.1)

## 2016-01-21 MED ORDER — SODIUM CHLORIDE 0.9 % IV SOLN
Freq: Once | INTRAVENOUS | Status: AC
  Administered 2016-01-21: 11:00:00 via INTRAVENOUS
  Filled 2016-01-21: qty 4

## 2016-01-21 MED ORDER — HEPARIN SOD (PORK) LOCK FLUSH 100 UNIT/ML IV SOLN
500.0000 [IU] | Freq: Once | INTRAVENOUS | Status: AC | PRN
  Administered 2016-01-21: 500 [IU]
  Filled 2016-01-21: qty 5

## 2016-01-21 MED ORDER — PACLITAXEL PROTEIN-BOUND CHEMO INJECTION 100 MG
125.0000 mg/m2 | Freq: Once | INTRAVENOUS | Status: AC
  Administered 2016-01-21: 200 mg via INTRAVENOUS
  Filled 2016-01-21: qty 40

## 2016-01-21 MED ORDER — SODIUM CHLORIDE 0.9 % IV SOLN
1000.0000 mg/m2 | Freq: Once | INTRAVENOUS | Status: AC
  Administered 2016-01-21: 1634 mg via INTRAVENOUS
  Filled 2016-01-21: qty 42.98

## 2016-01-21 MED ORDER — SODIUM CHLORIDE 0.9% FLUSH
10.0000 mL | INTRAVENOUS | Status: DC | PRN
  Administered 2016-01-21: 10 mL
  Filled 2016-01-21: qty 10

## 2016-01-21 MED ORDER — SODIUM CHLORIDE 0.9 % IV SOLN
Freq: Once | INTRAVENOUS | Status: AC
  Administered 2016-01-21: 11:00:00 via INTRAVENOUS

## 2016-01-21 NOTE — Patient Instructions (Signed)
[facility] Discharge Instructions for Patients Receiving Chemotherapy   Beginning January 23rd 2017 lab work for the [facility] will be done in the  Main lab at [facility] on 1st floor. If you have a lab appointment with the [facility] please come in thru the  Main Entrance and check in at the main information desk   Today you received the following chemotherapy agents Abraxane and Gemzar Cycle 2 Day 8.  To help prevent nausea and vomiting after your treatment, we encourage you to take your nausea medication as instructed.  If you develop nausea and vomiting, or diarrhea that is not controlled by your medication, call the clinic.  The clinic phone number is [phone]. Office hours are Monday-Friday 8:30am-5:00pm.  BELOW ARE SYMPTOMS THAT SHOULD BE REPORTED IMMEDIATELY:  *FEVER GREATER THAN 101.0 F  *CHILLS WITH OR WITHOUT FEVER  NAUSEA AND VOMITING THAT IS NOT CONTROLLED WITH YOUR NAUSEA MEDICATION  *UNUSUAL SHORTNESS OF BREATH  *UNUSUAL BRUISING OR BLEEDING  TENDERNESS IN MOUTH AND THROAT WITH OR WITHOUT PRESENCE OF ULCERS  *URINARY PROBLEMS  *BOWEL PROBLEMS  UNUSUAL RASH Items with * indicate a potential emergency and should be followed up as soon as possible. If you have an emergency after office hours please contact your primary care physician or go to the nearest emergency department.  Please call the clinic during office hours if you have any questions or concerns.   You may also contact the Patient Navigator at [phone] should you have any questions or need assistance in obtaining follow up care.      Resources For Cancer Patients and their Caregivers ? American Cancer Society: Can assist with transportation, wigs, general needs, runs Look Good Feel Better.        3125176306 ? Cancer Care: Provides financial assistance, online support groups, medication/co-pay assistance.  1-800-813-HOPE 7144051738) ? [place] Assists [place] cancer patients and their families through emotional , educational and financial support.  (671) 428-3501 ? [place] DSS Where to apply for food stamps, Medicaid and utility assistance. 575-560-6013 ? RCATS: Transportation to medical appointments. 505-654-8417 ? Social Security Administration: May apply for disability if have a Stage IV cancer. [phone] 984-248-5099 ? [employer], Disability and Transit Services: Assists with nutrition, care and transit needs. [phone]

## 2016-01-21 NOTE — Progress Notes (Signed)
Initial Consult with Patient. Identified on MST as weight loss and poor PO intake.   Contacted Pt by visiting prior to office visit   Wt Readings from Last 10 Encounters:  01/21/16 111 lb 9.6 oz (50.621 kg)  01/14/16 112 lb 3.2 oz (50.894 kg)  01/07/16 110 lb 4.8 oz (50.032 kg)  12/30/15 111 lb 14.4 oz (50.758 kg)  12/23/15 111 lb 14.4 oz (50.758 kg)  12/21/15 115 lb (52.164 kg)  12/16/15 115 lb (52.164 kg)  12/09/15 118 lb 9.6 oz (53.797 kg)  12/08/15 116 lb 12.8 oz (52.98 kg)  12/03/15 118 lb (53.524 kg)   Patient weight has been relatively stable recently. He is down 7 lbs in the last 2 months  Patient reports oral intake as extremely good. He is suffering from symptoms including a constant "Gripey"/queezy, feeling in his stomach and constipation though notes much improvement in his bowels recently. He has started taking miralax and reports being regular now. He did report one instance of fecal incontinence/diarrhea one afternoon, but this was just a single occurrence.  He is dissapointed that he has not gained weight given how much he has been eating. He says he eats 2-3 meals a day. He went through some of his recent meals and, if accurate, they seem to be extremely large. He has been eating a lot of seafood. Additionally, He drinks 2 bottles of ensure BEFORE each meal.  If these are Ensure Plus supplements, this would be >2100 kcal without even taking into account his meals  meat, no vegetables or fruit.   It is hard to believe he is consuming this many kcals and only maintaining weight unless he is malabsorbing, though he has not had the typical symptoms associated with malabsorption. Alternatively, it is possible that his metabolism could be widely altered due to his tumour and thus is incapable of anabolism.  In case pt is, in fact, malabsorbing, rd reccommended taking creon 5-10 minutes BEFORE each meal instead of eating his food with the enzymes. Additionally, he is instructed to  take creon with his snacks as he says he was only taking it with meals. If his meals are really as large as he reports, he may need an increase in dosage.   He already sounds to be consuming the right foods, but RD reinforced. Went over concept of never eating a food by itself. He should add toppings, condiments, dressings, creams, syrups etc to her meals. RD reccommended protein powder and taught how to effectively use it.  Very slight amounts of Unflavored whey protein powder should be added to  liquids: soups, cereal, drinks, hot cereals. It can even be sprinkled on solids items. The idea is to add small enough amount that he does not notice it is there and thereby not altering her foods. If he tastes the grittiness, this is too much. Explained this may only add 20-30 kcals and a few grams of protein, but this will add up over time.   Finally, due to patients extremely high consumption of Ensure. RD went over the ensure assistance program. There was a case of chocolate available that another pt had declined. This was given to pt today. Eligible for 2 more. He is to contact RD ahead of time when he needs another one   Pt seems to be extremely motivated to follow rd recommendations and really wants to regain his lost weight.   Left my contact info, coupons, case of Ensure, and handouts titled "Making the Most of  Each Bite" and "Constipation"  Burtis Junes RD, LDN, [facility] Pager: [phone] 01/21/2016 2:33 PM

## 2016-01-21 NOTE — Patient Instructions (Signed)
[place] at [facility] Discharge Instructions  RECOMMENDATIONS MADE BY THE CONSULTANT AND ANY TEST RESULTS WILL BE SENT TO YOUR REFERRING PHYSICIAN.  Exam done and seen today by Dr. Whitney Muse Labs done today, added CA19-9 Labs and chemo in one week Return to see the doctor in one week Please call the clinic if you have any questions or concerns   Thank you for choosing [place] at [facility] to provide your oncology and hematology care.  To afford each patient quality time with our provider, please arrive at least 15 minutes before your scheduled appointment time.   Beginning January 23rd 2017 lab work for the [facility] will be done in the  Main lab at [facility] on 1st floor. If you have a lab appointment with the [facility] please come in thru the  Main Entrance and check in at the main information desk  You need to re-schedule your appointment should you arrive 10 or more minutes late.  We strive to give you quality time with our providers, and arriving late affects you and other patients whose appointments are after yours.  Also, if you no show three or more times for appointments you may be dismissed from the clinic at the providers discretion.     Again, thank you for choosing [facility].  Our hope is that these requests will decrease the amount of time that you wait before being seen by our physicians.       _____________________________________________________________  Should you have questions after your visit to [facility], please contact our office at [phone] between the hours of 8:30 a.m. and 4:30 p.m.  Voicemails left after 4:30 p.m. will not be returned until the following business day.  For prescription refill requests, have your pharmacy contact our office.         Resources For Cancer Patients and their Caregivers ? American Cancer Society: Can assist with transportation, wigs,  general needs, runs Look Good Feel Better.        802-806-0260 ? Cancer Care: Provides financial assistance, online support groups, medication/co-pay assistance.  1-800-813-HOPE 604-322-5834) ? [place] Assists [place] cancer patients and their families through emotional , educational and financial support.  818 332 9622 ? [place] DSS Where to apply for food stamps, Medicaid and utility assistance. 252-775-3312 ? RCATS: Transportation to medical appointments. 4182896331 ? Social Security Administration: May apply for disability if have a Stage IV cancer. 705 052 7171 (567) 531-9875 ? [employer], Disability and Transit Services: Assists with nutrition, care and transit needs. [place] Support Programs: @10RELATIVEDAYS @ > Cancer Support Group  2nd Tuesday of the month 1pm-2pm, Journey Room  > Creative Journey  3rd Tuesday of the month 1130am-1pm, Journey Room  > Look Good Feel Better  1st Wednesday of the month 10am-12 noon, Journey Room (Call [place] to register [phone])

## 2016-01-21 NOTE — Progress Notes (Signed)
Tolerated chemo well. Ambulatory on discharge home with family. 

## 2016-01-21 NOTE — Progress Notes (Signed)
Nicholas [name]  Progress Note  Patient Care Team: Provider Not In System as PCP - General  CHIEF COMPLAINTS/PURPOSE OF CONSULTATION:  Adenocarcinoma of body/tail of pancreas, T4N0Mx History of TB, treated by [facility] History of MAC Weight Loss History Hyponatremia Tobacco Abuse    Pancreatic cancer ([place])   11/11/2015 - 11/13/2015 Hospital Admission abdominal pain, cramping, diarrhea, weight loss, hyponatremia CT pancreatic body mass   11/12/2015 Imaging 4.7 cm mass of pancreas to L of midline, involvement of several vascular structures also possibly the distal duodenum   11/21/2015 - 11/25/2015 Hospital Admission EUS on 5/23, hyponatremia   11/22/2015 Imaging Diffuse mesenteric edema, large hypo enhancing pancreatic mass, abutment of celiac axis, SMA, encasement of splenic artery, occludes splenic vein, compresses porta splenic confluence   11/25/2015 Procedure EUS Dr. Ardis Hughs, irreg mass in pancreatic body/tail. 4.2 cm, involvement of major blood vessels not appreciated due to size of mass   11/25/2015 Pathology Results malignant cells c/w adenocarcinoma   12/04/2015 Miscellaneous Acid Fast Smear negative   12/04/2015 Imaging No convincing evidence of pulm mets, improvement in LLL, RUL angular nodular thickening seen on comparison CT from 2013, residual cavitation remains at the L lung base, one nodule oblique fissue on R 56mm new   12/08/2015 Procedure Port [name] cath   12/09/2015 -  Chemotherapy Gemzar/Abraxane days 1, 8, and 15 every 28 days.   12/12/2015 Imaging MRI abd- Limited MRI, D/C prior to completion at patient request. Large infiltrative 8.1 x 4.7 x 5.8 cm pancreatic mass centered at the junction of the pancreatic body and tail, consistent with known primary pancreatic adenocarc   12/23/2015 Treatment Plan Change Treatment deferred x 7 days due to thrombocytopenia    HISTORY OF PRESENTING ILLNESS:  Nicholas [name] 65 y.o. male is here for additional follow-up of  locally advanced pancreatic cancer. Not felt to be [name] surgical candidate.   No vomiting, no fever. C2D8 was delayed secondary to [name] platelet count of 96K.  Nicholas [name] is accompanied by his sister.  I personally reviewed and went over 12/12/2015 MRI results with the patient and his daughter, at the sister's request. The study was stopped early since the patient could not complete it.   Reports intermittent cramping. These cramps typically happen right when he is about to go to bed. The night he called EMS, the cramping began at 10 pm and he got tired of not being able to sleep so he called EMS. When he arrived at the hospital he was told he had [name] blockage. He went home and took Miralax. He has not experienced cramping since this incidence. He has been taking Miralax daily and having [name] bowel movement every morning. He purchases the off brand Miralax since the name brand is expensive.   He presented to the ED and was admitted. Seen by Dr. Arnoldo Morale on 7/12. CT scan of the abdomen showed nonspecific bowel dilatation with possible transition zone in the distal small bowel. He does have feces and air in the colon. Assessment/Plan: Impression: Partial small bowel obstruction, possible bowel dysfunction versus mechanical in nature. No need for acute surgical intervention. Patient would like to be discharged as he is starting to move his bowels and feels better. I told him that we should start MiraLAX. He does have [name] bowel regimen that he takes at home. Will follow peripherally with you.  JENKINS,Nicholas [name] 01/14/2016, 2:06 PM   Note that Nicholas [name] signed out AMA.  Reports his pain  varies day to day and night to night. His stomach constantly feels abnormal, bloated, gripey, and uncomfortable in the mid-abdomen. Reports his back gets to bothering him quite [name] lot.  He has tried over the counter sleeping medication which has helped.  He is back to walking 2 miles [name] day. He would like to get back to restoring the  log [place] he [place].   The patient states that the enzymes have helped him be able to eat better. When he eats two large meals in [name] day, he will drink four Boost. He has been able to eat eggs, country ham, and Raisin Bran. On other days, he reports drinking 6 Boost [name] day. He notes the Boost and Ensure are very expensive. Admits he has turned into an ice cream and chocolate junky.  His friend found on the internet that dandelions help with cancer. [name] cousin gave the patient some tea made with dandelion. He would like to know if he can drink this tea with treatment.    MEDICAL HISTORY:  Past Medical History  Diagnosis Date  . Abdominal pain in male     "was told he has [name] Mass near pancreas"  . Cancer (HCC)     Skin cancer-ear, skin cancer -leeg,cancer of leg muscle- left., basal cell   . Pancreatic mass april 2017  . TB (pulmonary tuberculosis)     3 yrs ago-tx" positive TB test aslso" was told he is not contagious.treatment done for regulat TB.  Marland Kitchen Pancreatic cancer ([place]) 12/02/2015    SURGICAL HISTORY: Past Surgical History  Procedure Laterality Date  . Leg surgery Left     " cancer removed from muscle"  . Colonoscopy w/ polypectomy    . Eus N/[name] 11/25/2015    Procedure: ESOPHAGEAL ENDOSCOPIC ULTRASOUND (EUS) RADIAL;  Surgeon: Milus Banister, MD;  Location: [facility] ENDOSCOPY;  Service: Endoscopy;  Laterality: N/[name];    SOCIAL HISTORY: Social History   Social History  . Marital Status: Single    Spouse Name: N/[name]  . Number of Children: N/[name]  . Years of Education: N/[name]   Occupational History  . Not on file.   Social History Main Topics  . Smoking status: Current Every Day Smoker -- 0.50 packs/day for 30 years    Types: Cigarettes  . Smokeless tobacco: Never Used  . Alcohol Use: No     Comment: 1-2 beers at night - pt states he can't remember the last time he drank any alcohol  . Drug Use: No  . Sexual Activity: No   Other Topics Concern  . Not on file   Social History  Narrative   Single. He has been engaged 4 times. No children He has cut his smoking back since he had tuberculosis. He has smoked more with recent pain. Sometimes 1 ppd, "it varies". Since last Thursday, he has had 2 beers. It does not taste right. He has never had acid reflux before, but now he does. He was in the [employer]. Worked in Office manager, [place], and the post office.   FAMILY HISTORY: Family History  Problem Relation Age of Onset  . Pneumonia Father   . Stroke Mother    Mother living 20 years old at [employer] and rehabilitation center.  Father deceased at 31 years old of pneumonia. He drank and drank [name] lot. [employer] as [name] second language teacher. Had [name] massive heart attack at 63 yo 1 sister, lives in [place]:  has No Known Allergies.  MEDICATIONS:  Current Outpatient Prescriptions  Medication Sig Dispense Refill  . aspirin EC 81 MG tablet Take 81 mg by mouth daily.    . feeding supplement, ENSURE ENLIVE, (ENSURE ENLIVE) LIQD Take 237 mLs by mouth 2 (two) times daily between meals. 60 Bottle 0  . Gemcitabine HCl (GEMZAR IV) Inject into the vein. To begin June 6    . hydrocerin (EUCERIN) CREA Apply 1 application topically 2 (two) times daily as needed (For dry skin.).    Marland Kitchen lidocaine-prilocaine (EMLA) cream Apply [name] quarter size amount to port site 1 hour prior to chemo. Do not rub in. Cover with plastic wrap. 30 g 3  . lipase/protease/amylase (CREON) 36000 UNITS CPEP capsule Take 1 capsule (36,000 Units total) by mouth 3 (three) times daily before meals. 180 capsule 3  . Menthol-Methyl Salicylate (THERA-GESIC) 1-15 % CREA Apply 1 application topically 4 (four) times daily as needed (For pain.).    Marland Kitchen mirtazapine (REMERON) 15 MG tablet Take on tablet po qhs for 5 days then increase to two tablets po qhs 60 tablet 3  . Multiple Vitamins-Minerals (MULTIVITAMINS THER. W/MINERALS) TABS tablet Take 1 tablet by mouth daily. Reported on 12/09/2015    . ondansetron (ZOFRAN) 8 MG tablet  Take 1 tablet (8 mg total) by mouth every 8 (eight) hours as needed for nausea or vomiting. 30 tablet 2  . oxyCODONE (OXYCONTIN) 15 mg 12 hr tablet Take 1 tablet (15 mg total) by mouth every 12 (twelve) hours. 60 tablet 0  . Oxycodone HCl 10 MG TABS Take 1 tablet (10 mg total) by mouth every 4 (four) hours as needed. 90 tablet 0  . PACLitaxel Protein-Bound Part (ABRAXANE IV) Inject into the vein. To begin June 6    . polyethylene glycol (GOLYTELY) 236 g solution Take 4,000 mLs by mouth once. 4000 mL 0  . polyethylene glycol (MIRALAX / GLYCOLAX) packet Take 17 g by mouth daily. (Patient taking differently: Take 17 g by mouth daily as needed for mild constipation. ) 14 each 0  . prochlorperazine (COMPAZINE) 10 MG tablet Take 1 tablet (10 mg total) by mouth every 6 (six) hours as needed for nausea or vomiting. 30 tablet 2  . senna-docusate (SENOKOT S) 8.6-50 MG tablet Take 2 tablets by mouth daily.     No current facility-administered medications for this visit.    Review of Systems  HENT: Negative.   Eyes: Negative.   Respiratory: Positive for shortness of breath.        Chronic SOB   Cardiovascular: Negative.   Gastrointestinal: Positive for heartburn, abdominal pain and constipation.       Constipation secondary to pain medication. Severe abdominal pain and cramping.  Genitourinary: Negative.   Musculoskeletal: Negative.   Skin: Negative.   Neurological: Negative.   Endo/Heme/Allergies: Negative.   Psychiatric/Behavioral: The patient has insomnia. The patient is not nervous/anxious.        Insomnia secondary to abdominal pain/cramping.  All other systems reviewed and are negative.  14 point ROS was done and is otherwise as detailed above or in HPI   PHYSICAL EXAMINATION: ECOG PERFORMANCE STATUS: 2 - Symptomatic, <50% confined to bed  Filed Vitals:   01/21/16 0947  BP: 139/85  Pulse: 98  Temp: 98.4 F (36.9 C)  Resp: 16   Filed Weights   01/21/16 0947  Weight: 111 lb 9.6  oz (50.621 kg)    Physical Exam  Constitutional: He is oriented to person, place, and time.  Thin, cachexic, pleasant and appropriate.  HENT:  Head: Normocephalic and atraumatic.  Mouth/Throat: Oropharynx is clear and moist.  Eyes: Conjunctivae and EOM are normal. Pupils are equal, round, and reactive to light. Right eye exhibits no discharge. Left eye exhibits no discharge. No scleral icterus.  Neck: Normal range of motion. Neck supple. No JVD present. No tracheal deviation present. No thyromegaly present.  Cardiovascular: Normal rate and regular rhythm.   Murmur heard. Pulmonary/Chest: Effort normal. No stridor.  Coarse breath sounds bilaterally  Abdominal: Soft. Bowel sounds are normal. He exhibits no distension and no mass. There is no tenderness. There is no rebound and no guarding.  Musculoskeletal: Normal range of motion. He exhibits no edema or tenderness.  Lymphadenopathy:    He has no cervical adenopathy.  Neurological: He is alert and oriented to person, place, and time. Gait normal.  Skin: Skin is warm and dry.  Psychiatric: Mood, memory, affect and judgment normal.  Nursing note and vitals reviewed.   LABORATORY DATA:  I have reviewed the data as listed Lab Results  Component Value Date   WBC 11.3* 01/21/2016   HGB 11.6* 01/21/2016   HCT 33.4* 01/21/2016   MCV 95.4 01/21/2016   PLT 409* 01/21/2016   CMP     Component Value Date/Time   NA 131* 01/21/2016 0857   K 4.2 01/21/2016 0857   CL 99* 01/21/2016 0857   CO2 27 01/21/2016 0857   GLUCOSE 181* 01/21/2016 0857   BUN 8 01/21/2016 0857   CREATININE 0.59* 01/21/2016 0857   CALCIUM 8.6* 01/21/2016 0857   PROT 6.9 01/21/2016 0857   ALBUMIN 3.6 01/21/2016 0857   AST 21 01/21/2016 0857   ALT 18 01/21/2016 0857   ALKPHOS 67 01/21/2016 0857   BILITOT 0.3 01/21/2016 0857   GFRNONAA >60 01/21/2016 0857   GFRAA >60 01/21/2016 0857   Results for Nicholas [name], Nicholas [name] (MRN [id number]) as of 01/23/2016 07:52  Ref.  Range 11/12/2015 05:13 12/04/2015 15:07 12/16/2015 09:23 01/21/2016 08:57  CA 19-9 Latest Ref Range: 0-35 U/mL 1576 (H) 1814 (H) 2255 (H) 1367 (H)     PATHOLOGY:     RADIOGRAPHIC STUDIES: I have personally reviewed the radiological images as listed and agreed with the findings in the report.  Study Result     CLINICAL DATA: History of chronic cough. New diagnosis of pancreatic cancer.  EXAM: CT CHEST WITH CONTRAST  TECHNIQUE: Multidetector CT imaging of the chest was performed during intravenous contrast administration.  CONTRAST: 21mL ISOVUE-300 IOPAMIDOL (ISOVUE-300) INJECTION 61%  COMPARISON: CT abdomen 11/22/2015, CT thorax 03/23/2012  FINDINGS: Mediastinum/Nodes: No axillary or supraclavicular adenopathy. No mediastinal hilar lymphadenopathy. No pericardial fluid. Esophagus is normal.  Lungs/Pleura: In comparison to CT exam of 03/23/2012 there is improvement in the left lower lobe peribronchial nodular thickening as well as endobronchial opacification. Peripheral cystic / cavitary change persists in the LEFT lower lobe. One cavitary lesion measuring 20 mm (image 57, series 3) at site of previously seen 3.5 cm thin-walled cavitary lesion. Smaller cavitary focus measuring 1.2 cm image 70, series 3 is new from 1.2 cm nodule of same site.  New nodule along the oblique fissure measuring 8 mm in the RIGHT lung (image 78, series 3.  On comparison exam there are multiple irregular nodules in the RIGHT upper lobe similar to the LEFT lower lobe. There is mild residual nodularity in the RIGHT upper lobe remaining (image 70, series 3.  Upper abdomen: Large pancreatic mass again demonstrated and partially imaged measuring up to 7.7 cm.  Musculoskeletal: Degenerative changes  of the spine without aggressive osseous lesion.  IMPRESSION: 1. No convincing evidence of pulmonary metastasis. 2. Improvement in LEFT lower lobe and RIGHT upper lobe angular nodular  thickening seen on comparison CT from 2013. Residual cavitation remains at the LEFT lung base. 3. One nodule along the oblique fissure on the RIGHT measuring 8 mm is new from prior.   Electronically Signed  By: Suzy Bouchard M.D.  On: 12/04/2015 15:32    EUS:        ASSESSMENT & PLAN:  Adenocarcinoma of body/tail of pancreas, T4N0M1, Stage IV disease History of TB, [facility] by [facility] MAC Weight Loss History Hyponatremia Tobacco Abuse Cancer related pain Hypokalemia  He presents for ongoing Abraxane/Gemzar. Ca 19-9 is declining. Weight has stabilized. At this point I do not know what to make of his imaging. Will follow clinically over the next several weeks. It would be best to follow CA 19-9, clinical status and repeat imaging at least 3 months into therapy.   He spoke with Ovid Curd, the nutritionist, today. Ovid Curd will get the patient [name] case of Boost.  End of life issues were addressed with the patient and his sister today.  The patient was [name] little more open to this than his sister. 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 I again reviewed the incurability of his cancer.  He discussed taking chemotherapy for years. I advised him that would be unlikely. We needed to discuss goals of his care and treatment. He does not have [name] living will.  His sister continued to comment that their family is scotch-ir[place] and live "forever."  Will refer him into the "Living with advanced cancer group" when it starts in September and continue to work on End of Life issues moving forward and DNR status. He currently remains full code.  It is very difficult to get [name] clear history from him regarding his bowel habits. Will continue to follow for now.  All questions were answered. The patient knows to call the clinic with any problems, questions or concerns.  This document serves as [name] record  of services personally performed by Ancil Linsey, MD. It was created on her behalf by Arlyce Harman, [name] trained medical scribe. The creation of this record is based on the scribe's personal observations and the provider's statements to them. This document has been checked and approved by the attending provider.  I have reviewed the above documentation for accuracy and completeness, and I agree with the above.  This note was electronically signed.    Molli Hazard, MD  01/21/2016 10:22 AM

## 2016-01-22 LAB — CANCER ANTIGEN 19-9: CA 19 9: 1367 U/mL — AB (ref 0–35)

## 2016-01-23 ENCOUNTER — Encounter (HOSPITAL_COMMUNITY): Payer: Self-pay | Admitting: Hematology & Oncology

## 2016-01-28 ENCOUNTER — Encounter (HOSPITAL_COMMUNITY): Payer: Medicaid Other | Attending: Oncology | Admitting: Oncology

## 2016-01-28 ENCOUNTER — Encounter: Payer: Self-pay | Admitting: Dietician

## 2016-01-28 ENCOUNTER — Encounter (HOSPITAL_COMMUNITY): Payer: Self-pay | Admitting: Oncology

## 2016-01-28 ENCOUNTER — Other Ambulatory Visit (HOSPITAL_COMMUNITY): Payer: Medicaid Other

## 2016-01-28 ENCOUNTER — Encounter (HOSPITAL_BASED_OUTPATIENT_CLINIC_OR_DEPARTMENT_OTHER): Payer: Medicaid Other

## 2016-01-28 VITALS — BP 144/79 | HR 80 | Temp 98.4°F | Resp 18 | Wt 114.6 lb

## 2016-01-28 DIAGNOSIS — C259 Malignant neoplasm of pancreas, unspecified: Secondary | ICD-10-CM

## 2016-01-28 DIAGNOSIS — K869 Disease of pancreas, unspecified: Secondary | ICD-10-CM | POA: Diagnosis not present

## 2016-01-28 DIAGNOSIS — C258 Malignant neoplasm of overlapping sites of pancreas: Secondary | ICD-10-CM

## 2016-01-28 DIAGNOSIS — C787 Secondary malignant neoplasm of liver and intrahepatic bile duct: Secondary | ICD-10-CM

## 2016-01-28 DIAGNOSIS — Z5111 Encounter for antineoplastic chemotherapy: Secondary | ICD-10-CM

## 2016-01-28 LAB — CBC WITH DIFFERENTIAL/PLATELET
BASOS ABS: 0 10*3/uL (ref 0.0–0.1)
Basophils Relative: 1 %
EOS ABS: 0.1 10*3/uL (ref 0.0–0.7)
EOS PCT: 3 %
HCT: 31.5 % — ABNORMAL LOW (ref 39.0–52.0)
HEMOGLOBIN: 10.9 g/dL — AB (ref 13.0–17.0)
LYMPHS ABS: 1.4 10*3/uL (ref 0.7–4.0)
Lymphocytes Relative: 28 %
MCH: 33.2 pg (ref 26.0–34.0)
MCHC: 34.6 g/dL (ref 30.0–36.0)
MCV: 96 fL (ref 78.0–100.0)
Monocytes Absolute: 0.4 10*3/uL (ref 0.1–1.0)
Monocytes Relative: 8 %
NEUTROS PCT: 60 %
Neutro Abs: 3.2 10*3/uL (ref 1.7–7.7)
PLATELETS: 310 10*3/uL (ref 150–400)
RBC: 3.28 MIL/uL — AB (ref 4.22–5.81)
RDW: 15 % (ref 11.5–15.5)
WBC: 5.2 10*3/uL (ref 4.0–10.5)

## 2016-01-28 LAB — COMPREHENSIVE METABOLIC PANEL
ALBUMIN: 3.7 g/dL (ref 3.5–5.0)
ALK PHOS: 70 U/L (ref 38–126)
ALT: 33 U/L (ref 17–63)
AST: 27 U/L (ref 15–41)
Anion gap: 6 (ref 5–15)
BUN: 9 mg/dL (ref 6–20)
CALCIUM: 8.8 mg/dL — AB (ref 8.9–10.3)
CHLORIDE: 99 mmol/L — AB (ref 101–111)
CO2: 27 mmol/L (ref 22–32)
CREATININE: 0.39 mg/dL — AB (ref 0.61–1.24)
GFR calc Af Amer: >60 mL/min (ref >60)
GFR calc non Af Amer: >60 mL/min (ref >60)
GLUCOSE: 148 mg/dL — AB (ref 65–99)
Potassium: 4.3 mmol/L (ref 3.5–5.1)
SODIUM: 132 mmol/L — AB (ref 135–145)
Total Bilirubin: 0.3 mg/dL (ref 0.3–1.2)
Total Protein: 7.2 g/dL (ref 6.5–8.1)

## 2016-01-28 MED ORDER — SODIUM CHLORIDE 0.9% FLUSH: 10.0000 mL | INTRAVENOUS | Status: DC | PRN

## 2016-01-28 MED ORDER — GEMCITABINE HCL CHEMO INJECTION 1 GM/26.3ML
1000.0000 mg/m2 | Freq: Once | INTRAVENOUS | Status: AC
  Administered 2016-01-28: 1634 mg via INTRAVENOUS
  Filled 2016-01-28: qty 42.98

## 2016-01-28 MED ORDER — PACLITAXEL PROTEIN-BOUND CHEMO INJECTION 100 MG
125.0000 mg/m2 | Freq: Once | INTRAVENOUS | Status: AC
  Administered 2016-01-28: 200 mg via INTRAVENOUS
  Filled 2016-01-28: qty 40

## 2016-01-28 MED ORDER — SODIUM CHLORIDE 0.9 % IV SOLN
Freq: Once | INTRAVENOUS | Status: AC
  Administered 2016-01-28: 12:00:00 via INTRAVENOUS

## 2016-01-28 MED ORDER — SODIUM CHLORIDE 0.9 % IV SOLN
Freq: Once | INTRAVENOUS | Status: AC
  Administered 2016-01-28: 12:00:00 via INTRAVENOUS
  Filled 2016-01-28: qty 4

## 2016-01-28 MED ORDER — HEPARIN SOD (PORK) LOCK FLUSH 100 UNIT/ML IV SOLN
500.0000 [IU] | Freq: Once | INTRAVENOUS | Status: AC | PRN
  Administered 2016-01-28: 500 [IU]

## 2016-01-28 NOTE — Progress Notes (Signed)
Patient tolerated infusion well.  VSS.   

## 2016-01-28 NOTE — Progress Notes (Signed)
PROVIDER NOT IN SYSTEM No address on file  Malignant neoplasm of pancreas, unspecified location of malignancy ([place]) - Plan: CBC with Differential, Comprehensive metabolic panel, Cancer antigen 19-9, CBC with Differential, Comprehensive metabolic panel  CURRENT THERAPY: Abraxane/Gemzar days 1, 8, 15 every 28 days beginning on 12/09/2015  INTERVAL HISTORY: Nicholas [name] 65 y.o. 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  Began systemic chemotherapy consisting of Abraxane/Gemzar days 1, 8, 15 every 28 days on 12/09/2015.  He also has a history of TB, treated by [facility] and history of MAC.    Pancreatic cancer ([place])   11/11/2015 - 11/13/2015 Hospital Admission    abdominal pain, cramping, diarrhea, weight loss, hyponatremia CT pancreatic body mass     11/12/2015 Imaging    4.7 cm mass of pancreas to L of midline, involvement of several vascular structures also possibly the distal duodenum     11/21/2015 - 11/25/2015 Hospital Admission    EUS on 5/23, hyponatremia     11/22/2015 Imaging    Diffuse mesenteric edema, large hypo enhancing pancreatic mass, abutment of celiac axis, SMA, encasement of splenic artery, occludes splenic vein, compresses porta splenic confluence     11/25/2015 Procedure    EUS Dr. Ardis Hughs, irreg mass in pancreatic body/tail. 4.2 cm, involvement of major blood vessels not appreciated due to size of mass     11/25/2015 Pathology Results    malignant cells c/w adenocarcinoma     12/04/2015 Miscellaneous    Acid Fast Smear negative     12/04/2015 Imaging    No convincing evidence of pulm mets, improvement in LLL, RUL angular nodular thickening seen on comparison CT from 2013, residual cavitation remains at the L lung base, one nodule oblique fissue on R 98mm  new     12/08/2015 Procedure    Port a cath     12/09/2015 -  Chemotherapy    Gemzar/Abraxane days 1, 8, and 15 every 28 days.     12/12/2015 Imaging    MRI abd- Limited MRI, D/C prior to completion at patient request. Large infiltrative 8.1 x 4.7 x 5.8 cm pancreatic mass centered at the junction of the pancreatic body and tail, consistent with known primary pancreatic adenocarc     12/23/2015 Treatment Plan Change    Treatment deferred x 7 days due to thrombocytopenia     01/14/2016 Imaging    CT abd/pelvis- Mass in the body/ tail of the pancreas consistent with known pancreatic carcinoma. Likely metastases to the liver demonstrating some progression since previous study.     He reports that his bowels are back to normal. He's moving them daily. He is on a good bowel regimen at home that very effective for him. He is using combination of MiraLAX and Senokot S.  He denies any abdominal pain. He denies any new pain.  He has a rash on his lower legs bilaterally secondary to chigger bites.  No indication of infection. He notes that they are improving and pruritus is improving.  He reports difficulty with sleeping. He notes that he sleeps during the day which hinders his ability to sleep at night. He reports that Dr. Whitney Muse prescribed him an antidepressant but he does not believe he is depressed. He took the medication one time and did not like the  way it made him feel. As a result, he discontinue the medication.  Review of Systems  Constitutional: Negative.  Negative for chills, fever and weight loss.  HENT: Negative.   Eyes: Negative.   Respiratory: Negative.   Cardiovascular: Negative.   Gastrointestinal: Negative.  Negative for abdominal pain, constipation, diarrhea, nausea and vomiting.  Genitourinary: Negative.   Musculoskeletal: Negative.   Skin: Negative.   Neurological: Negative.   Endo/Heme/Allergies: Negative.   Psychiatric/Behavioral: The patient has insomnia.     Past  Medical History:  Diagnosis Date  . Abdominal pain in male    "was told he has a Mass near pancreas"  . Cancer (HCC)    Skin cancer-ear, skin cancer -leeg,cancer of leg muscle- left., basal cell   . Pancreatic cancer ([place]) 12/02/2015  . Pancreatic mass april 2017  . TB (pulmonary tuberculosis)    3 yrs ago-tx" positive TB test aslso" was told he is not contagious.treatment done for regulat TB.    Past Surgical History:  Procedure Laterality Date  . COLONOSCOPY W/ POLYPECTOMY    . EUS N/A 11/25/2015   Procedure: ESOPHAGEAL ENDOSCOPIC ULTRASOUND (EUS) RADIAL;  Surgeon: Milus Banister, MD;  Location: [facility];  Service: Endoscopy;  Laterality: N/A;  . LEG SURGERY Left    " cancer removed from muscle"    Family History  Problem Relation Age of Onset  . Stroke Mother   . Pneumonia Father     Social History   Social History  . Marital status: Single    Spouse name: N/A  . Number of children: N/A  . Years of education: N/A   Social History Main Topics  . Smoking status: Current Every Day Smoker    Packs/day: 0.50    Years: 30.00    Types: Cigarettes  . Smokeless tobacco: Never Used  . Alcohol use No     Comment: 1-2 beers at night - pt states he can't remember the last time he drank any alcohol  . Drug use: No  . Sexual activity: No   Other Topics Concern  . None   Social History Narrative  . None     PHYSICAL EXAMINATION  ECOG PERFORMANCE STATUS: 1 - Symptomatic but completely ambulatory  There were no vitals filed for this visit.  Blood pressure 144/79 Pulse 80 Respirations 18 Temperature 98.4 Oximetry 99% on room air  GENERAL:alert, no distress, cachectic, comfortable, cooperative, smiling and in chemo-recliner, unaccompanied SKIN: skin color, texture, turgor are normal, no rashes or significant lesions HEAD: Normocephalic, No masses, lesions, tenderness or abnormalities EYES: normal, EOMI, Conjunctiva are pink and non-injected EARS: External ears  normal OROPHARYNX:lips, buccal mucosa, and tongue normal and mucous membranes are moist  NECK: supple, trachea midline LYMPH:  no palpable lymphadenopathy BREAST:not examined LUNGS: clear to auscultation  HEART: regular rate & rhythm ABDOMEN:abdomen soft, non-tender and normal bowel sounds BACK: Back symmetric, no curvature. EXTREMITIES:less then 2 second capillary refill, no joint deformities, effusion, or inflammation, no skin discoloration, no cyanosis  NEURO: alert & oriented x 3 with fluent speech, no focal motor/sensory deficits, gait normal   LABORATORY DATA: CBC    Component Value Date/Time   WBC 5.2 01/28/2016 1025   RBC 3.28 (L) 01/28/2016 1025   HGB 10.9 (L) 01/28/2016 1025   HCT 31.5 (L) 01/28/2016 1025   PLT 310 01/28/2016 1025   MCV 96.0 01/28/2016 1025   MCH 33.2 01/28/2016 1025   MCHC 34.6 01/28/2016 1025   RDW 15.0 01/28/2016 1025   LYMPHSABS  1.4 01/28/2016 1025   MONOABS 0.4 01/28/2016 1025   EOSABS 0.1 01/28/2016 1025   BASOSABS 0.0 01/28/2016 1025      Chemistry      Component Value Date/Time   NA 132 (L) 01/28/2016 1025   K 4.3 01/28/2016 1025   CL 99 (L) 01/28/2016 1025   CO2 27 01/28/2016 1025   BUN 9 01/28/2016 1025   CREATININE 0.39 (L) 01/28/2016 1025      Component Value Date/Time   CALCIUM 8.8 (L) 01/28/2016 1025   ALKPHOS 70 01/28/2016 1025   AST 27 01/28/2016 1025   ALT 33 01/28/2016 1025   BILITOT 0.3 01/28/2016 1025        PENDING LABS:   RADIOGRAPHIC STUDIES:  Ct Abdomen Pelvis W Contrast  Result Date: 01/14/2016 CLINICAL DATA:  Abdominal pain. Last bowel movement yesterday. History of pancreatic cancer. EXAM: CT ABDOMEN AND PELVIS WITH CONTRAST TECHNIQUE: Multidetector CT imaging of the abdomen and pelvis was performed using the standard protocol following bolus administration of intravenous contrast. CONTRAST:  166mL ISOVUE-300 IOPAMIDOL (ISOVUE-300) INJECTION 61% COMPARISON:  11/22/2015 FINDINGS: Lower esophageal wall  thickening suggesting reflux disease. Calcified nodules in the left lung base again demonstrated. Diffuse emphysematous changes in the lungs. There is a hypo enhancing mass involving the body and tail of the pancreas, measuring about 3 x 6.7 cm. Margins of the lesion partially encircle the superior mesenteric artery with infiltration into the celiac axis. Adjacent lymph nodes are present. Changes are consistent with known pancreatic carcinoma and are similar to prior study. Focal hypo enhancing lesions demonstrated in the liver, largest measuring about 10 mm diameter. This is suspicious for hepatic metastasis. There is slight progression since previous study. Small amount of free fluid throughout the upper abdomen, extending along the pericolic gutters to the pelvis. This is likely reactive fluid or ascites. Fluid and gaseous distention of small bowel with decompressed terminal ileum. Transition zone is not well defined but appears to be in the right lower quadrant. Cause is not determined although mesenteric lymph nodes are present which may contribute. Calcification of the abdominal aorta. No aneurysm. Suggestion of focal areas of stenosis demonstrated in the infrarenal abdominal aorta and in the proximal iliac arteries bilaterally. Adrenal glands, spleen, kidneys, and inferior vena cava are unremarkable. Pelvis: Bladder wall is mildly thickened, possibly due to under distention or cystitis. Appendix is not identified. No significant pelvic lymphadenopathy. Degenerative changes in the spine. No destructive bone lesions IMPRESSION: Mass in the body/ tail of the pancreas consistent with known pancreatic carcinoma. Likely metastases to the liver demonstrating some progression since previous study. Diffuse free fluid throughout the abdomen and pelvis. Changes of small bowel obstruction with transition zone in the right lower quadrant. Etiology is not determined. Electronically Signed   By: Lucienne Capers M.D.   On:  01/14/2016 04:33   Dg Abd 2 Views  Result Date: 01/14/2016 CLINICAL DATA:  Severe abdominal pain since yesterday. History of TB, pancreatic cancer, smoker. EXAM: ABDOMEN - 2 VIEW COMPARISON:  MRI abdomen 12/11/2015.  Abdomen 12/03/2015. FINDINGS: Gaseous distention of multiple small bowel loops with air-fluid levels on the upright view. Changes consistent with small bowel obstruction. No free intra-abdominal air. No radiopaque stones. Degenerative changes in the spine. IMPRESSION: Gaseous distention of small bowel with air-fluid levels consistent with small bowel obstruction. Electronically Signed   By: Lucienne Capers M.D.   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  Began systemic chemotherapy consisting of Abraxane/Gemzar days 1, 8, 15 every 28 days on 12/09/2015.  He also has a history of TB, treated by [facility] and history of MAC.  Oncology history is updated.  Pre-treatment labs today as ordered CBC diff, CMET.  I personally reviewed and went over laboratory results with the patient.  The results are noted within this dictation.  Labs satisfy treatment parameters.    He reports that his bowels are working well without any issues at this time.  He reports issues with sleeping, "but Dr. Whitney Muse gave me an prescription for an antidepressant.  I am not depressed.Marland KitchenMarland KitchenI don't think."  He reports taking the medication one day and he did not like the way it made him feel.  He has not taken the medication in quite some time as a result.  Weight is up 3 lbs compared to 2 weeks ago.  He notes that his appetite is good.  He has a few medications that he wants filled but he needs to establish his [facility] care in [place].    He  will return in 2 weeks for follow-up and continued treatment.   ORDERS PLACED FOR THIS ENCOUNTER: Orders Placed This Encounter  Procedures  . CBC with Differential  . Comprehensive metabolic panel  . Cancer antigen 19-9  . CBC with Differential  . Comprehensive metabolic panel    MEDICATIONS PRESCRIBED THIS ENCOUNTER: No orders of the defined types were placed in this encounter.   THERAPY PLAN:  Continue palliative treatment as outlined above and continued discussions regarding goals of care.  All questions were answered. The patient knows to call the clinic with any problems, questions or concerns. We can certainly see the patient much sooner if necessary.  Patient and plan discussed with Dr. Ancil Linsey and she is in agreement with the aforementioned.   This note is electronically signed by: Doy Mince 01/28/2016 7:45 PM

## 2016-01-28 NOTE — Patient Instructions (Signed)
[place] at [facility] Discharge Instructions  RECOMMENDATIONS MADE BY THE CONSULTANT AND ANY TEST RESULTS WILL BE SENT TO YOUR REFERRING PHYSICIAN.  We will continue with treatment as planned.  Your cancer marker, thus far, has been responding to treatment. For sleep, try 1-2 tablets of Benadryl at bedtime.  You can also try Melatonin.  If either are ineffective, please call us for other options. Return for treatment as planned. Return for follow-up appointment in approximately 2 weeks.  Thank you for choosing [place] at [facility] to provide your oncology and hematology care.  To afford each patient quality time with our provider, please arrive at least 15 minutes before your scheduled appointment time.   Beginning January 23rd 2017 lab work for the [facility] will be done in the  Main lab at [facility] on 1st floor. If you have a lab appointment with the [facility] please come in thru the  Main Entrance and check in at the main information desk  You need to re-schedule your appointment should you arrive 10 or more minutes late.  We strive to give you quality time with our providers, and arriving late affects you and other patients whose appointments are after yours.  Also, if you no show three or more times for appointments you may be dismissed from the clinic at the providers discretion.     Again, thank you for choosing [facility].  Our hope is that these requests will decrease the amount of time that you wait before being seen by our physicians.       _____________________________________________________________  Should you have questions after your visit to [facility], please contact our office at [phone] between the hours of 8:30 a.m. and 4:30 p.m.  Voicemails left after 4:30 p.m. will not be returned until the following business day.  For prescription refill requests, have your pharmacy contact  our office.         Resources For Cancer Patients and their Caregivers ? American Cancer Society: Can assist with transportation, wigs, general needs, runs Look Good Feel Better.        517-752-5764 ? Cancer Care: Provides financial assistance, online support groups, medication/co-pay assistance.  1-800-813-HOPE (240) 412-3139) ? [place] Assists [place] cancer patients and their families through emotional , educational and financial support.  760 409 8150 ? [place] DSS Where to apply for food stamps, Medicaid and utility assistance. (712)503-2987 ? RCATS: Transportation to medical appointments. (971) 172-4531 ? Social Security Administration: May apply for disability if have a Stage IV cancer. (567)377-6253 412-594-0022 ? [employer], Disability and Transit Services: Assists with nutrition, care and transit needs. [facility] Support Programs: @10RELATIVEDAYS @ > Cancer Support Group  2nd Tuesday of the month 1pm-2pm, Journey Room  > Creative Journey  3rd Tuesday of the month 1130am-1pm, Journey Room  > Look Good Feel Better  1st Wednesday of the month 10am-12 noon, Journey Room (Call [facility] to register [phone])

## 2016-01-28 NOTE — Patient Instructions (Signed)
[facility] Discharge Instructions for Patients Receiving Chemotherapy   Beginning January 23rd 2017 lab work for the [facility] will be done in the  Main lab at [facility] on 1st floor. If you have a lab appointment with the [facility] please come in thru the  Main Entrance and check in at the main information desk   Today you received the following chemotherapy agents: abraxane and Gemzar.     If you develop nausea and vomiting, or diarrhea that is not controlled by your medication, call the clinic.  The clinic phone number is [phone]. Office hours are Monday-Friday 8:30am-5:00pm.  BELOW ARE SYMPTOMS THAT SHOULD BE REPORTED IMMEDIATELY:  *FEVER GREATER THAN 101.0 F  *CHILLS WITH OR WITHOUT FEVER  NAUSEA AND VOMITING THAT IS NOT CONTROLLED WITH YOUR NAUSEA MEDICATION  *UNUSUAL SHORTNESS OF BREATH  *UNUSUAL BRUISING OR BLEEDING  TENDERNESS IN MOUTH AND THROAT WITH OR WITHOUT PRESENCE OF ULCERS  *URINARY PROBLEMS  *BOWEL PROBLEMS  UNUSUAL RASH Items with * indicate a potential emergency and should be followed up as soon as possible. If you have an emergency after office hours please contact your primary care physician or go to the nearest emergency department.  Please call the clinic during office hours if you have any questions or concerns.   You may also contact the Patient Navigator at [phone] should you have any questions or need assistance in obtaining follow up care.      Resources For Cancer Patients and their Caregivers ? American Cancer Society: Can assist with transportation, wigs, general needs, runs Look Good Feel Better.        (540)002-2089 ? Cancer Care: Provides financial assistance, online support groups, medication/co-pay assistance.  1-800-813-HOPE 515-195-5250) ? [place] Assists [place] cancer patients and their families through emotional , educational and financial support.   440-574-6552 ? [place] DSS Where to apply for food stamps, Medicaid and utility assistance. (901) 585-4144 ? RCATS: Transportation to medical appointments. 9252989000 ? Social Security Administration: May apply for disability if have a Stage IV cancer. 8160301484 [phone] ? [employer], Disability and Transit Services: Assists with nutrition, care and transit needs. 315-266-2434

## 2016-01-28 NOTE — Assessment & Plan Note (Addendum)
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  Began systemic chemotherapy consisting of Abraxane/Gemzar days 1, 8, 15 every 28 days on 12/09/2015.  He also has a history of TB, treated by [facility] and history of MAC.  Oncology history is updated.  Pre-treatment labs today as ordered CBC diff, CMET.  I personally reviewed and went over laboratory results with the patient.  The results are noted within this dictation.  Labs satisfy treatment parameters.    He reports that his bowels are working well without any issues at this time.  He reports issues with sleeping, "but Dr. Whitney Muse gave me an prescription for an antidepressant.  I am not depressed.Marland KitchenMarland KitchenI don't think."  He reports taking the medication one day and he did not like the way it made him feel.  He has not taken the medication in quite some time as a result.  Weight is up 3 lbs compared to 2 weeks ago.  He notes that his appetite is good.  He has a few medications that he wants filled but he needs to establish his [facility] care in [place].    He will return in 2 weeks for follow-up and continued treatment.

## 2016-01-28 NOTE — Discharge Summary (Signed)
Physician Discharge Summary  Nicholas [name] MRN: [id number] DOB/AGE: [birth year] 63 y.o.  PCP: PROVIDER NOT IN SYSTEM   Admit date: 01/14/2016 Discharge date: 01/28/2016  Discharge Diagnoses:    Principal Problem:   SBO (small bowel obstruction) ([place]) Active Problems:   Abdominal pain, acute, generalized   Pancreatic cancer (HCC)   Nausea & vomiting    Follow-up recommendations Follow-up with PCP in 3-5 days , including all  additional recommended appointments as below Follow-up CBC, CMP in 3-5 days Left ama       Discharge Medication List as of 01/14/2016  4:42 PM    CONTINUE these medications which have NOT CHANGED   Details  aspirin EC 81 MG tablet Take 81 mg by mouth daily., Until Discontinued, Historical Med    feeding supplement, ENSURE ENLIVE, (ENSURE ENLIVE) LIQD Take 237 mLs by mouth 2 (two) times daily between meals., Starting 11/25/2015, Until Discontinued, Print    Gemcitabine HCl (GEMZAR IV) Inject into the vein. To begin June 6, Until Discontinued, Historical Med    hydrocerin (EUCERIN) CREA Apply 1 application topically 2 (two) times daily as needed (For dry skin.)., Until Discontinued, Historical Med    lidocaine-prilocaine (EMLA) cream Apply a quarter size amount to port site 1 hour prior to chemo. Do not rub in. Cover with plastic wrap., Normal    lipase/protease/amylase (CREON) 36000 UNITS CPEP capsule Take 1 capsule (36,000 Units total) by mouth 3 (three) times daily before meals., Starting 01/07/2016, Until Discontinued, Print    Menthol-Methyl Salicylate (THERA-GESIC) 1-15 % CREA Apply 1 application topically 4 (four) times daily as needed (For pain.)., Until Discontinued, Historical Med    mirtazapine (REMERON) 15 MG tablet Take on tablet po qhs for 5 days then increase to two tablets po qhs, Print    Multiple Vitamins-Minerals (MULTIVITAMINS THER. W/MINERALS) TABS tablet Take 1 tablet by mouth daily. Reported on 12/09/2015, Until Discontinued,  Historical Med    ondansetron (ZOFRAN) 8 MG tablet Take 1 tablet (8 mg total) by mouth every 8 (eight) hours as needed for nausea or vomiting., Starting 12/02/2015, Until Discontinued, Normal    oxyCODONE (OXYCONTIN) 15 mg 12 hr tablet Take 1 tablet (15 mg total) by mouth every 12 (twelve) hours., Starting 01/07/2016, Until Discontinued, Print    PACLitaxel Protein-Bound Part (ABRAXANE IV) Inject into the vein. To begin June 6, Until Discontinued, Historical Med    polyethylene glycol (GOLYTELY) 236 g solution Take 4,000 mLs by mouth once., Starting 12/22/2015, Print    polyethylene glycol (MIRALAX / GLYCOLAX) packet Take 17 g by mouth daily., Starting 11/25/2015, Until Discontinued, Print    prochlorperazine (COMPAZINE) 10 MG tablet Take 1 tablet (10 mg total) by mouth every 6 (six) hours as needed for nausea or vomiting., Starting 12/02/2015, Until Discontinued, Normal    senna-docusate (SENOKOT S) 8.6-50 MG tablet Take 2 tablets by mouth daily., Until Discontinued, Historical Med    Oxycodone HCl 10 MG TABS Take 1 tablet (10 mg total) by mouth every 4 (four) hours as needed., Starting 12/30/2015, Until Discontinued, Print         Discharge Condition:  UNKNOWN   Discharge Instructions Get Medicines reviewed and adjusted: Please take all your medications with you for your next visit with your Primary MD  Please request your Primary MD to go over all hospital tests and procedure/radiological results at the follow up, please ask your Primary MD to get all Hospital records sent to his/her office.  If you experience worsening of your admission symptoms,  develop shortness of breath, life threatening emergency, suicidal or homicidal thoughts you must seek medical attention immediately by calling 911 or calling your MD immediately if symptoms less severe.  You must read complete instructions/literature along with all the possible adverse reactions/side effects for all the Medicines you take and  that have been prescribed to you. Take any new Medicines after you have completely understood and accpet all the possible adverse reactions/side effects.   Do not drive when taking Pain medications.   Do not take more than prescribed Pain, Sleep and Anxiety Medications  Special Instructions: If you have smoked or chewed Tobacco in the last 2 yrs please stop smoking, stop any regular Alcohol and or any Recreational drug use.  Wear Seat belts while driving.  Please note  You were cared for by a hospitalist during your hospital stay. Once you are discharged, your primary care physician will handle any further medical issues. Please note that NO REFILLS for any discharge medications will be authorized once you are discharged, as it is imperative that you return to your primary care physician (or establish a relationship with a primary care physician if you do not have one) for your aftercare needs so that they can reassess your need for medications and monitor your lab values.     No Known Allergies    Disposition: 07-Left Against Medical Advice or Left Without Being Seen   Consults: gen surgery     Significant Diagnostic Studies:  Ct Abdomen Pelvis W Contrast  Result Date: 01/14/2016 CLINICAL DATA:  Abdominal pain. Last bowel movement yesterday. History of pancreatic cancer. EXAM: CT ABDOMEN AND PELVIS WITH CONTRAST TECHNIQUE: Multidetector CT imaging of the abdomen and pelvis was performed using the standard protocol following bolus administration of intravenous contrast. CONTRAST:  154mL ISOVUE-300 IOPAMIDOL (ISOVUE-300) INJECTION 61% COMPARISON:  11/22/2015 FINDINGS: Lower esophageal wall thickening suggesting reflux disease. Calcified nodules in the left lung base again demonstrated. Diffuse emphysematous changes in the lungs. There is a hypo enhancing mass involving the body and tail of the pancreas, measuring about 3 x 6.7 cm. Margins of the lesion partially encircle the superior  mesenteric artery with infiltration into the celiac axis. Adjacent lymph nodes are present. Changes are consistent with known pancreatic carcinoma and are similar to prior study. Focal hypo enhancing lesions demonstrated in the liver, largest measuring about 10 mm diameter. This is suspicious for hepatic metastasis. There is slight progression since previous study. Small amount of free fluid throughout the upper abdomen, extending along the pericolic gutters to the pelvis. This is likely reactive fluid or ascites. Fluid and gaseous distention of small bowel with decompressed terminal ileum. Transition zone is not well defined but appears to be in the right lower quadrant. Cause is not determined although mesenteric lymph nodes are present which may contribute. Calcification of the abdominal aorta. No aneurysm. Suggestion of focal areas of stenosis demonstrated in the infrarenal abdominal aorta and in the proximal iliac arteries bilaterally. Adrenal glands, spleen, kidneys, and inferior vena cava are unremarkable. Pelvis: Bladder wall is mildly thickened, possibly due to under distention or cystitis. Appendix is not identified. No significant pelvic lymphadenopathy. Degenerative changes in the spine. No destructive bone lesions IMPRESSION: Mass in the body/ tail of the pancreas consistent with known pancreatic carcinoma. Likely metastases to the liver demonstrating some progression since previous study. Diffuse free fluid throughout the abdomen and pelvis. Changes of small bowel obstruction with transition zone in the right lower quadrant. Etiology is not determined. Electronically Signed  By: Lucienne Capers M.D.   On: 01/14/2016 04:33   Dg Abd 2 Views  Result Date: 01/14/2016 CLINICAL DATA:  Severe abdominal pain since yesterday. History of TB, pancreatic cancer, smoker. EXAM: ABDOMEN - 2 VIEW COMPARISON:  MRI abdomen 12/11/2015.  Abdomen 12/03/2015. FINDINGS: Gaseous distention of multiple small bowel loops  with air-fluid levels on the upright view. Changes consistent with small bowel obstruction. No free intra-abdominal air. No radiopaque stones. Degenerative changes in the spine. IMPRESSION: Gaseous distention of small bowel with air-fluid levels consistent with small bowel obstruction. Electronically Signed   By: Lucienne Capers M.D.   On: 01/14/2016 02:25       Filed Weights   01/14/16 0055 01/14/16 0625  Weight: 49.9 kg (110 lb) 50.9 kg (112 lb 3.2 oz)     Microbiology: No results found for this or any previous visit (from the past 240 hour(s)).     Blood Culture    Component Value Date/Time   SDES LESION PENILE 01/19/2013 0804   SPECREQUEST NONE 01/19/2013 0804   CULT No Herpes Simplex Virus detected. 01/19/2013 0804   REPTSTATUS 01/22/2013 FINAL 01/19/2013 0804      Labs: No results found for this or any previous visit (from the past 48 hour(s)).   Lipid Panel  No results found for: CHOL, TRIG, HDL, CHOLHDL, VLDL, LDLCALC, LDLDIRECT   No results found for: HGBA1C   Lab Results  Component Value Date   CREATININE 0.39 (L) 01/28/2016     65 y.o. male with medical history significant of pancreatic cancer diagnosed last couple of months on chemo comes in with sudden worsening generalized abdominal pain around 10pm with associated nausea and now vomiting in the ED. Pt has been constipated and thought this was due to his constipation. No flatus in hours, no BM in a day. No fevers. Has been loosing a lot of weight since his diagnosis. Has chronic abdominal pain since diagnosed but this is much worse than his usual. Pt found to have psbo on ct scan and referred for admission for treatment  Assessment and plan Small bowel obstruction-patient refused NG tube Continue IV fluids, conservative management Minimize narcotics if possible KUB today shows patient's distention of the small bowel with air-fluid levels consistent with small bowel obstruction CT scan shows  transition zone in the right lower quadrant Partial small bowel obstruction, possible bowel dysfunction versus mechanical in nature.  acute surgical intervention was not needed.   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 Platelets 96, white count 6.2,  Hyponatremia-likely secondary to dehydration/metastatic disease    Discharge Exam:   Blood pressure (!) 152/80, pulse 96, temperature 97.7 F (36.5 C), temperature source Oral, resp. rate 18, height 5\' 11"  (1.803 m), weight 50.9 kg (112 lb 3.2 oz), SpO2 96 %.        SignedReyne Dumas 01/28/2016, 3:50 PM        Time spent >45 mins

## 2016-01-28 NOTE — Progress Notes (Signed)
Following up with pt  Contacted Pt by visiting during his infusion   Wt Readings from Last 10 Encounters:  01/28/16 114 lb 9.6 oz (52 kg)  01/21/16 111 lb 9.6 oz (50.6 kg)  01/14/16 112 lb 3.2 oz (50.9 kg)  01/07/16 110 lb 4.8 oz (50 kg)  12/30/15 111 lb 14.4 oz (50.8 kg)  12/23/15 111 lb 14.4 oz (50.8 kg)  12/21/15 115 lb (52.2 kg)  12/16/15 115 lb (52.2 kg)  12/09/15 118 lb 9.6 oz (53.8 kg)  12/08/15 116 lb 12.8 oz (53 kg)   Patient weight has Increased by 3 lbs, a significant and much desired gain.   When asked what changed this past week, he said he did not know. He thought he may be eating more at each meal due to having a better appetite. Additionally, he has not been constipated which had been a major issue just a few weeks prior.   Last week during initial assessment, pt was instructed to take his enzymes 5-10 minutes before eating instead of with his meals. He states he did this. He wondered if this was the reason he had an improved appetite.   He has been using a lot of Ensure. He drinks 2 before each meal and says that yesterday he drank 8 ensures! He said this was due to his insomnia and because he couldn't sleep, he just kept drinking Ensure. He states he found the number for the pharmacy and personally called them to ask for another case. RD stated it would be best to call him as not all the pharmacists are familiar with the ensure assistance program. RD will place an order for a chocolate case.   He did note one night where he had severe diarrhea. However, this was just a acute instance and has not been ongoing. He said he woke up the next day and had a regular BM. He did report eating a lot of seafood. Potentially the diarrhea stemmed from a food he ate.    He does have occasional flank pains, but these only last a minute or so and they seem not to affect him to greatly. He says he is trying to decrease his pain meds to reduce the constipating side effects.   This week pt  is doing exceptional. He changed the time he takes his enzymes and reported no change in how he felt w/ the exception of the one night of diarrhea. He is having regular bowel movement and is not constipated. He is consuming supplements frequently and believes he is eating more at each meal. He has gained a significant amount of weight in 1 week, though exact reason for gain unclear, potentially from the combination of all the above.   Pt was in a good mood today though he was tired from his lack of sleep last night.   RD will continue to follow.   Burtis Junes RD, LDN, [facility] [facility] Pager: [phone] 01/28/2016 11:12 AM

## 2016-01-30 ENCOUNTER — Telehealth (HOSPITAL_COMMUNITY): Payer: Self-pay | Admitting: *Deleted

## 2016-01-30 ENCOUNTER — Other Ambulatory Visit (HOSPITAL_COMMUNITY): Payer: Self-pay | Admitting: Oncology

## 2016-01-30 DIAGNOSIS — K8689 Other specified diseases of pancreas: Secondary | ICD-10-CM

## 2016-01-30 DIAGNOSIS — K769 Liver disease, unspecified: Secondary | ICD-10-CM

## 2016-01-30 DIAGNOSIS — C251 Malignant neoplasm of body of pancreas: Secondary | ICD-10-CM

## 2016-01-30 MED ORDER — OXYCODONE HCL 10 MG PO TABS: 10.0000 mg | ORAL_TABLET | ORAL | 0 refills | Status: DC | PRN

## 2016-01-30 MED ORDER — OXYCODONE HCL ER 15 MG PO T12A: 15.0000 mg | EXTENDED_RELEASE_TABLET | Freq: Two times a day (BID) | ORAL | 0 refills | Status: DC

## 2016-01-30 NOTE — Telephone Encounter (Signed)
Printed.  TK 

## 2016-01-31 ENCOUNTER — Encounter (HOSPITAL_COMMUNITY): Payer: Self-pay | Admitting: Hematology & Oncology

## 2016-02-04 ENCOUNTER — Other Ambulatory Visit (HOSPITAL_COMMUNITY): Payer: Self-pay | Admitting: Hematology & Oncology

## 2016-02-05 ENCOUNTER — Ambulatory Visit (HOSPITAL_COMMUNITY): Payer: Medicaid Other

## 2016-02-09 ENCOUNTER — Encounter (HOSPITAL_COMMUNITY): Payer: Self-pay

## 2016-02-12 ENCOUNTER — Encounter (HOSPITAL_BASED_OUTPATIENT_CLINIC_OR_DEPARTMENT_OTHER): Payer: Medicaid Other

## 2016-02-12 ENCOUNTER — Encounter (HOSPITAL_COMMUNITY): Payer: Medicaid Other | Attending: Hematology & Oncology | Admitting: Oncology

## 2016-02-12 ENCOUNTER — Encounter: Payer: Self-pay | Admitting: Dietician

## 2016-02-12 ENCOUNTER — Encounter (HOSPITAL_COMMUNITY): Payer: Self-pay | Admitting: Oncology

## 2016-02-12 VITALS — BP 138/81 | HR 87 | Temp 98.4°F | Resp 18

## 2016-02-12 VITALS — BP 126/86 | HR 102 | Temp 98.1°F | Resp 18 | Wt 112.1 lb

## 2016-02-12 DIAGNOSIS — Z5111 Encounter for antineoplastic chemotherapy: Secondary | ICD-10-CM

## 2016-02-12 DIAGNOSIS — G4700 Insomnia, unspecified: Secondary | ICD-10-CM

## 2016-02-12 DIAGNOSIS — C259 Malignant neoplasm of pancreas, unspecified: Secondary | ICD-10-CM

## 2016-02-12 DIAGNOSIS — C251 Malignant neoplasm of body of pancreas: Secondary | ICD-10-CM | POA: Diagnosis present

## 2016-02-12 DIAGNOSIS — K5900 Constipation, unspecified: Secondary | ICD-10-CM | POA: Diagnosis present

## 2016-02-12 DIAGNOSIS — C258 Malignant neoplasm of overlapping sites of pancreas: Secondary | ICD-10-CM | POA: Diagnosis present

## 2016-02-12 DIAGNOSIS — K869 Disease of pancreas, unspecified: Secondary | ICD-10-CM | POA: Diagnosis not present

## 2016-02-12 DIAGNOSIS — C257 Malignant neoplasm of other parts of pancreas: Secondary | ICD-10-CM

## 2016-02-12 DIAGNOSIS — R143 Flatulence: Secondary | ICD-10-CM | POA: Diagnosis present

## 2016-02-12 DIAGNOSIS — K7689 Other specified diseases of liver: Secondary | ICD-10-CM | POA: Insufficient documentation

## 2016-02-12 DIAGNOSIS — Z8611 Personal history of tuberculosis: Secondary | ICD-10-CM | POA: Insufficient documentation

## 2016-02-12 DIAGNOSIS — K8689 Other specified diseases of pancreas: Secondary | ICD-10-CM

## 2016-02-12 DIAGNOSIS — E876 Hypokalemia: Secondary | ICD-10-CM | POA: Diagnosis present

## 2016-02-12 DIAGNOSIS — C787 Secondary malignant neoplasm of liver and intrahepatic bile duct: Secondary | ICD-10-CM

## 2016-02-12 LAB — COMPREHENSIVE METABOLIC PANEL
ALBUMIN: 3.7 g/dL (ref 3.5–5.0)
ALK PHOS: 78 U/L (ref 38–126)
ALT: 23 U/L (ref 17–63)
AST: 21 U/L (ref 15–41)
Anion gap: 6 (ref 5–15)
BILIRUBIN TOTAL: 0.2 mg/dL — AB (ref 0.3–1.2)
BUN: 9 mg/dL (ref 6–20)
CALCIUM: 8.7 mg/dL — AB (ref 8.9–10.3)
CHLORIDE: 98 mmol/L — AB (ref 101–111)
CO2: 27 mmol/L (ref 22–32)
CREATININE: 0.53 mg/dL — AB (ref 0.61–1.24)
GFR calc non Af Amer: >60 mL/min (ref >60)
Glucose, Bld: 203 mg/dL — ABNORMAL HIGH (ref 65–99)
Potassium: 4 mmol/L (ref 3.5–5.1)
SODIUM: 131 mmol/L — AB (ref 135–145)
TOTAL PROTEIN: 7.4 g/dL (ref 6.5–8.1)

## 2016-02-12 LAB — CBC WITH DIFFERENTIAL/PLATELET
BASOS ABS: 0 10*3/uL (ref 0.0–0.1)
BASOS PCT: 0 %
EOS ABS: 0.3 10*3/uL (ref 0.0–0.7)
Eosinophils Relative: 3 %
HCT: 34.1 % — ABNORMAL LOW (ref 39.0–52.0)
HEMOGLOBIN: 11.8 g/dL — AB (ref 13.0–17.0)
LYMPHS ABS: 1.5 10*3/uL (ref 0.7–4.0)
Lymphocytes Relative: 13 %
MCH: 33.7 pg (ref 26.0–34.0)
MCHC: 34.6 g/dL (ref 30.0–36.0)
MCV: 97.4 fL (ref 78.0–100.0)
Monocytes Absolute: 1.2 10*3/uL — ABNORMAL HIGH (ref 0.1–1.0)
Monocytes Relative: 11 %
NEUTROS ABS: 8.2 10*3/uL — AB (ref 1.7–7.7)
Neutrophils Relative %: 73 %
Platelets: 377 10*3/uL (ref 150–400)
RBC: 3.5 MIL/uL — ABNORMAL LOW (ref 4.22–5.81)
RDW: 17.9 % — AB (ref 11.5–15.5)
WBC Morphology: INCREASED
WBC: 11.2 10*3/uL — ABNORMAL HIGH (ref 4.0–10.5)

## 2016-02-12 MED ORDER — SODIUM CHLORIDE 0.9% FLUSH: 10.0000 mL | INTRAVENOUS | Status: DC | PRN

## 2016-02-12 MED ORDER — PROCHLORPERAZINE MALEATE 10 MG PO TABS
ORAL_TABLET | ORAL | Status: AC
  Filled 2016-02-12: qty 1

## 2016-02-12 MED ORDER — HEPARIN SOD (PORK) LOCK FLUSH 100 UNIT/ML IV SOLN
500.0000 [IU] | Freq: Once | INTRAVENOUS | Status: AC | PRN
  Administered 2016-02-12: 500 [IU]
  Filled 2016-02-12: qty 5

## 2016-02-12 MED ORDER — SODIUM CHLORIDE 0.9 % IV SOLN
Freq: Once | INTRAVENOUS | Status: AC
  Administered 2016-02-12: 11:00:00 via INTRAVENOUS
  Filled 2016-02-12: qty 4

## 2016-02-12 MED ORDER — PACLITAXEL PROTEIN-BOUND CHEMO INJECTION 100 MG
125.0000 mg/m2 | Freq: Once | INTRAVENOUS | Status: AC
  Administered 2016-02-12: 200 mg via INTRAVENOUS
  Filled 2016-02-12: qty 40

## 2016-02-12 MED ORDER — TEMAZEPAM 15 MG PO CAPS: 15.0000 mg | ORAL_CAPSULE | Freq: Every evening | ORAL | 1 refills | Status: DC | PRN

## 2016-02-12 MED ORDER — SODIUM CHLORIDE 0.9 % IV SOLN
1000.0000 mg/m2 | Freq: Once | INTRAVENOUS | Status: AC
  Administered 2016-02-12: 1634 mg via INTRAVENOUS
  Filled 2016-02-12: qty 42.98

## 2016-02-12 MED ORDER — OXYCODONE HCL 10 MG PO TABS: 10.0000 mg | ORAL_TABLET | ORAL | 0 refills | Status: DC | PRN

## 2016-02-12 MED ORDER — SODIUM CHLORIDE 0.9 % IV SOLN
Freq: Once | INTRAVENOUS | Status: AC
  Administered 2016-02-12: 11:00:00 via INTRAVENOUS

## 2016-02-12 NOTE — Patient Instructions (Signed)
[place] at [facility] Discharge Instructions  RECOMMENDATIONS MADE BY THE CONSULTANT AND ANY TEST RESULTS WILL BE SENT TO YOUR REFERRING PHYSICIAN.  You were seen by Gershon Mussel today. Prescription for Restoril given Return in 4 weeks for follow up  Thank you for choosing [place] at [facility] to provide your oncology and hematology care.  To afford each patient quality time with our provider, please arrive at least 15 minutes before your scheduled appointment time.   Beginning January 23rd 2017 lab work for the [facility] will be done in the  Main lab at [facility] on 1st floor. If you have a lab appointment with the [facility] please come in thru the  Main Entrance and check in at the main information desk  You need to re-schedule your appointment should you arrive 10 or more minutes late.  We strive to give you quality time with our providers, and arriving late affects you and other patients whose appointments are after yours.  Also, if you no show three or more times for appointments you may be dismissed from the clinic at the providers discretion.     Again, thank you for choosing [facility].  Our hope is that these requests will decrease the amount of time that you wait before being seen by our physicians.       _____________________________________________________________  Should you have questions after your visit to [facility] please contact our office at [phone] between the hours of 8:30 a.m. and 4:30 p.m.  Voicemails left after 4:30 p.m. will not be returned until the following business day.  For prescription refill requests, have your pharmacy contact our office.         Resources For Cancer Patients and their Caregivers ? American Cancer Society: Can assist with transportation, wigs, general needs, runs Look Good Feel Better.        ([phone] ? Cancer Care: Provides financial  assistance, online support groups, medication/co-pay assistance.  1-800-813-HOPE 316-584-7255) ? [facility] Assists [place] cancer patients and their families through emotional , educational and financial support.  720-242-1801 ? [place] DSS Where to apply for food stamps, Medicaid and utility assistance. 5615235986 ? RCATS: Transportation to medical appointments. 380 560 5569 ? Social Security Administration: May apply for disability if have a Stage IV cancer. 4705427919 707-696-2089 ? [employer], Disability and Transit Services: Assists with nutrition, care and transit needs. [place] Support Programs: @10RELATIVEDAYS @ > Cancer Support Group  2nd Tuesday of the month 1pm-2pm, Journey Room  > Creative Journey  3rd Tuesday of the month 1130am-1pm, Journey Room  > Look Good Feel Better  1st Wednesday of the month 10am-12 noon, Journey Room (Call [place] to register [phone])

## 2016-02-12 NOTE — Patient Instructions (Signed)
[facility] Discharge Instructions for Patients Receiving Chemotherapy   Beginning January 23rd 2017 lab work for the [facility] will be done in the  Main lab at [facility] on 1st floor. If you have a lab appointment with the [facility] please come in thru the  Main Entrance and check in at the main information desk   Today you received the following chemotherapy agents: Gemzar and abraxane.     If you develop nausea and vomiting, or diarrhea that is not controlled by your medication, call the clinic.  The clinic phone number is [phone]. Office hours are Monday-Friday 8:30am-5:00pm.  BELOW ARE SYMPTOMS THAT SHOULD BE REPORTED IMMEDIATELY:  *FEVER GREATER THAN 101.0 F  *CHILLS WITH OR WITHOUT FEVER  NAUSEA AND VOMITING THAT IS NOT CONTROLLED WITH YOUR NAUSEA MEDICATION  *UNUSUAL SHORTNESS OF BREATH  *UNUSUAL BRUISING OR BLEEDING  TENDERNESS IN MOUTH AND THROAT WITH OR WITHOUT PRESENCE OF ULCERS  *URINARY PROBLEMS  *BOWEL PROBLEMS  UNUSUAL RASH Items with * indicate a potential emergency and should be followed up as soon as possible. If you have an emergency after office hours please contact your primary care physician or go to the nearest emergency department.  Please call the clinic during office hours if you have any questions or concerns.   You may also contact the Patient Navigator at [phone] should you have any questions or need assistance in obtaining follow up care.      Resources For Cancer Patients and their Caregivers ? American Cancer Society: Can assist with transportation, wigs, general needs, runs Look Good Feel Better.        941-556-3781 ? Cancer Care: Provides financial assistance, online support groups, medication/co-pay assistance.  1-800-813-HOPE 820-636-8065) ? [facility] Assists [place] cancer patients and their families through emotional , educational and financial support.   586-850-2682 ? [place] DSS Where to apply for food stamps, Medicaid and utility assistance. 773-377-7318 ? RCATS: Transportation to medical appointments. 571-010-2032 ? Social Security Administration: May apply for disability if have a Stage IV cancer. (319) 525-4963 351-256-6770 ? [employer], Disability and Transit Services: Assists with nutrition, care and transit needs. (386)553-0675

## 2016-02-12 NOTE — Progress Notes (Signed)
Patient tolerated infusion well.  VSS.  Patient ambulatory leaving the clinic.

## 2016-02-12 NOTE — Assessment & Plan Note (Addendum)
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  Began systemic chemotherapy consisting of Abraxane/Gemzar days 1, 8, 15 every 28 days on 12/09/2015.  He also has a history of TB, treated by [facility] and history of MAC.  Oncology history is updated.  Pre-treatment labs today as ordered CBC diff, CMET, CA 19-9.  I personally reviewed and went over laboratory results with the patient.  The results are noted within this dictation.  Labs satisfy treatment parameters.    He reports issues with sleeping, "but Dr. Whitney Muse gave me an prescription for an antidepressant.  I am not depressed.Marland KitchenMarland KitchenI don't think."  He reports taking the medication one day and he did not like the way it made him feel.  He has not taken the medication in quite some time as a result.  He continues to have issues with insomnia.  He is using OTC medication without improvement.  We will try a low-dose Restoril to see if that is helpful.  Burtis Junes, RD is seeing the patient today for 2 lb weight loss.  He has imaging on 7/12 in the ED following cycle #2.  Restaging imaging will be held off for now and reconsidered following cycle #4.  He will return in 4 weeks for follow-up and continued treatment.

## 2016-02-12 NOTE — Progress Notes (Signed)
Follow up with pancreatic cancer patient  Contacted Pt by visiting during office visit  Wt Readings from Last 10 Encounters:  02/12/16 112 lb 1.6 oz (50.8 kg)  01/28/16 114 lb 9.6 oz (52 kg)  01/21/16 111 lb 9.6 oz (50.6 kg)  01/14/16 112 lb 3.2 oz (50.9 kg)  01/07/16 110 lb 4.8 oz (50 kg)  12/30/15 111 lb 14.4 oz (50.8 kg)  12/23/15 111 lb 14.4 oz (50.8 kg)  12/21/15 115 lb (52.2 kg)  12/16/15 115 lb (52.2 kg)  12/09/15 118 lb 9.6 oz (53.8 kg)   Patient weight decreased by 2 lbs in the last 2 weeks, but has been overall stable for the last couple months.   Patient reports oral intake as good. In fact, he reports generalized improvement. He is having regular BMs, his appetite is stable if not better and he is not having n/v. He is still having the "gripiness" in his stomach, but this is at baseline, no worse or better. His major complaint today is insomnia. He says he will get an average of 2 hrs of sleep a night and has to sleep on and off throughout the day. He notes extremely lethargy and fatigue thought to be related to his lack of sleep.   He is still eating three meals a day and drinking a couple Ensures before each meal. If he feels like he didn't eat a lot at a meal, he will drink extra Ensure. He is taking creon before his meals, but not his snacks. He doesn't report any diarrhea after the snacks in which he doesn't take creon. The meals he listed all contained high protein sources.   He was frustrated with his weight loss as he is eating well. He is also frustrated with his lack of energy and severe fatigue. RD explained his increased stress from lack of sleep may be playing a role in his inability to gain weight. More likely, he has altered metabolism associated with his malignancy that makes anabolism extremely difficult.   PA wrote prescription for sleep aide. Hopefully this will solve his insomnia and relieve his related stress/frustration associated with this.   Per pt  request, RD will order another Case of ensure. This is his last case that he is eligible for. Pt is aware of this.   Will continue to follow patient  Burtis Junes RD, LDN, [place] [facility] Pager: [phone] 02/12/2016 10:59 AM

## 2016-02-12 NOTE — Progress Notes (Signed)
PROVIDER NOT IN SYSTEM No address on file  Malignant neoplasm of other parts of pancreas ([place]) - Plan: Cancer antigen 19-9  Insomnia - Plan: temazepam (RESTORIL) 15 MG capsule  Pancreatic mass - Plan: Oxycodone HCl 10 MG TABS  CURRENT THERAPY: Abraxane/Gemzar days 1, 8, 15 every 28 days beginning on 12/09/2015  INTERVAL HISTORY: Nicholas [name] 65 y.o. 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  Began systemic chemotherapy consisting of Abraxane/Gemzar days 1, 8, 15 every 28 days on 12/09/2015.  He also has a history of TB, treated by [facility] and history of MAC.    Pancreatic cancer ([place])   11/11/2015 - 11/13/2015 Hospital Admission    abdominal pain, cramping, diarrhea, weight loss, hyponatremia CT pancreatic body mass     11/12/2015 Imaging    4.7 cm mass of pancreas to L of midline, involvement of several vascular structures also possibly the distal duodenum     11/21/2015 - 11/25/2015 Hospital Admission    EUS on 5/23, hyponatremia     11/22/2015 Imaging    Diffuse mesenteric edema, large hypo enhancing pancreatic mass, abutment of celiac axis, SMA, encasement of splenic artery, occludes splenic vein, compresses porta splenic confluence     11/25/2015 Procedure    EUS Dr. Ardis Hughs, irreg mass in pancreatic body/tail. 4.2 cm, involvement of major blood vessels not appreciated due to size of mass     11/25/2015 Pathology Results    malignant cells c/w adenocarcinoma     12/04/2015 Miscellaneous    Acid Fast Smear negative     12/04/2015 Imaging    No convincing evidence of pulm mets, improvement in LLL, RUL angular nodular thickening seen on comparison CT from 2013, residual cavitation remains at the L lung base, one nodule oblique fissue on R 1mm new     12/08/2015  Procedure    Port a cath     12/09/2015 -  Chemotherapy    Gemzar/Abraxane days 1, 8, and 15 every 28 days.     12/12/2015 Imaging    MRI abd- Limited MRI, D/C prior to completion at patient request. Large infiltrative 8.1 x 4.7 x 5.8 cm pancreatic mass centered at the junction of the pancreatic body and tail, consistent with known primary pancreatic adenocarc     12/23/2015 Treatment Plan Change    Treatment deferred x 7 days due to thrombocytopenia     01/14/2016 Imaging    CT abd/pelvis- Mass in the body/ tail of the pancreas consistent with known pancreatic carcinoma. Likely metastases to the liver demonstrating some progression since previous study.      He is doing well. His bowels are moving appropriately. He denies any diarrhea or constipation. He does admit to bilateral and lateral abdominal pain that comes and goes and resolves spontaneously. He notes that this discomfort radiates superiorly up into his thoracic area laterally. He denies any pattern to this discomfort. It happens infrequently.  He wants to know if his cancer is spreading.  I reviewed the results of his CT imaging in addition to his CA 19-9 that is coming down. At this time, we do not believe his malignancy is spreading.  He notes issues with continued insomnia. He is trying over-the-counter medication with no improvement.  Review of Systems  Constitutional: Negative.  Negative for  chills, fever and weight loss.  HENT: Negative.   Eyes: Negative.   Respiratory: Negative.   Cardiovascular: Negative.   Gastrointestinal: Positive for abdominal pain. Negative for constipation, diarrhea, nausea and vomiting.  Genitourinary: Negative.   Musculoskeletal: Negative.   Skin: Negative.   Neurological: Negative.   Endo/Heme/Allergies: Negative.   Psychiatric/Behavioral: The patient has insomnia.     Past Medical History:  Diagnosis Date  . Abdominal pain in male    "was told he has a Mass near pancreas"  . Cancer  (HCC)    Skin cancer-ear, skin cancer -leeg,cancer of leg muscle- left., basal cell   . Pancreatic cancer ([place]) 12/02/2015  . Pancreatic mass april 2017  . TB (pulmonary tuberculosis)    3 yrs ago-tx" positive TB test aslso" was told he is not contagious.treatment done for regulat TB.    Past Surgical History:  Procedure Laterality Date  . COLONOSCOPY W/ POLYPECTOMY    . EUS N/A 11/25/2015   Procedure: ESOPHAGEAL ENDOSCOPIC ULTRASOUND (EUS) RADIAL;  Surgeon: Milus Banister, MD;  Location: [facility];  Service: Endoscopy;  Laterality: N/A;  . LEG SURGERY Left    " cancer removed from muscle"    Family History  Problem Relation Age of Onset  . Stroke Mother   . Pneumonia Father     Social History   Social History  . Marital status: Single    Spouse name: N/A  . Number of children: N/A  . Years of education: N/A   Social History Main Topics  . Smoking status: Current Every Day Smoker    Packs/day: 0.50    Years: 30.00    Types: Cigarettes  . Smokeless tobacco: Never Used  . Alcohol use No     Comment: 1-2 beers at night - pt states he can't remember the last time he drank any alcohol  . Drug use: No  . Sexual activity: No   Other Topics Concern  . None   Social History Narrative  . None     PHYSICAL EXAMINATION  ECOG PERFORMANCE STATUS: 1 - Symptomatic but completely ambulatory  Vitals:   02/12/16 0900  BP: 126/86  Pulse: (!) 102  Resp: 18  Temp: 98.1 F (36.7 C)    GENERAL:alert, no distress, cachectic, comfortable, cooperative, smiling, unaccompanied SKIN: skin color, texture, turgor are normal, no rashes or significant lesions HEAD: Normocephalic, No masses, lesions, tenderness or abnormalities EYES: normal, EOMI, Conjunctiva are pink and non-injected EARS: External ears normal OROPHARYNX:lips, buccal mucosa, and tongue normal and mucous membranes are moist  NECK: supple, trachea midline LYMPH:  no palpable lymphadenopathy BREAST:not  examined LUNGS: clear to auscultation With decreased breath sounds bilaterally. HEART: regular rate & rhythm ABDOMEN:abdomen soft, non-tender and normal bowel sounds BACK: Back symmetric, no curvature. EXTREMITIES:less then 2 second capillary refill, no joint deformities, effusion, or inflammation, no skin discoloration, no cyanosis  NEURO: alert & oriented x 3 with fluent speech, no focal motor/sensory deficits, gait normal   LABORATORY DATA: CBC    Component Value Date/Time   WBC 11.2 (H) 02/12/2016 1009   RBC 3.50 (L) 02/12/2016 1009   HGB 11.8 (L) 02/12/2016 1009   HCT 34.1 (L) 02/12/2016 1009   PLT 377 02/12/2016 1009   MCV 97.4 02/12/2016 1009   MCH 33.7 02/12/2016 1009   MCHC 34.6 02/12/2016 1009   RDW 17.9 (H) 02/12/2016 1009   LYMPHSABS 1.5 02/12/2016 1009   MONOABS 1.2 (H) 02/12/2016 1009   EOSABS 0.3 02/12/2016 1009   BASOSABS  0.0 02/12/2016 1009      Chemistry      Component Value Date/Time   NA 131 (L) 02/12/2016 1009   K 4.0 02/12/2016 1009   CL 98 (L) 02/12/2016 1009   CO2 27 02/12/2016 1009   BUN 9 02/12/2016 1009   CREATININE 0.53 (L) 02/12/2016 1009      Component Value Date/Time   CALCIUM 8.7 (L) 02/12/2016 1009   ALKPHOS 78 02/12/2016 1009   AST 21 02/12/2016 1009   ALT 23 02/12/2016 1009   BILITOT 0.2 (L) 02/12/2016 1009        PENDING LABS:   RADIOGRAPHIC STUDIES:  Ct Abdomen Pelvis W Contrast  Result Date: 01/14/2016 CLINICAL DATA:  Abdominal pain. Last bowel movement yesterday. History of pancreatic cancer. EXAM: CT ABDOMEN AND PELVIS WITH CONTRAST TECHNIQUE: Multidetector CT imaging of the abdomen and pelvis was performed using the standard protocol following bolus administration of intravenous contrast. CONTRAST:  138mL ISOVUE-300 IOPAMIDOL (ISOVUE-300) INJECTION 61% COMPARISON:  11/22/2015 FINDINGS: Lower esophageal wall thickening suggesting reflux disease. Calcified nodules in the left lung base again demonstrated. Diffuse  emphysematous changes in the lungs. There is a hypo enhancing mass involving the body and tail of the pancreas, measuring about 3 x 6.7 cm. Margins of the lesion partially encircle the superior mesenteric artery with infiltration into the celiac axis. Adjacent lymph nodes are present. Changes are consistent with known pancreatic carcinoma and are similar to prior study. Focal hypo enhancing lesions demonstrated in the liver, largest measuring about 10 mm diameter. This is suspicious for hepatic metastasis. There is slight progression since previous study. Small amount of free fluid throughout the upper abdomen, extending along the pericolic gutters to the pelvis. This is likely reactive fluid or ascites. Fluid and gaseous distention of small bowel with decompressed terminal ileum. Transition zone is not well defined but appears to be in the right lower quadrant. Cause is not determined although mesenteric lymph nodes are present which may contribute. Calcification of the abdominal aorta. No aneurysm. Suggestion of focal areas of stenosis demonstrated in the infrarenal abdominal aorta and in the proximal iliac arteries bilaterally. Adrenal glands, spleen, kidneys, and inferior vena cava are unremarkable. Pelvis: Bladder wall is mildly thickened, possibly due to under distention or cystitis. Appendix is not identified. No significant pelvic lymphadenopathy. Degenerative changes in the spine. No destructive bone lesions IMPRESSION: Mass in the body/ tail of the pancreas consistent with known pancreatic carcinoma. Likely metastases to the liver demonstrating some progression since previous study. Diffuse free fluid throughout the abdomen and pelvis. Changes of small bowel obstruction with transition zone in the right lower quadrant. Etiology is not determined. Electronically Signed   By: Lucienne Capers M.D.   On: 01/14/2016 04:33   Dg Abd 2 Views  Result Date: 01/14/2016 CLINICAL DATA:  Severe abdominal pain since  yesterday. History of TB, pancreatic cancer, smoker. EXAM: ABDOMEN - 2 VIEW COMPARISON:  MRI abdomen 12/11/2015.  Abdomen 12/03/2015. FINDINGS: Gaseous distention of multiple small bowel loops with air-fluid levels on the upright view. Changes consistent with small bowel obstruction. No free intra-abdominal air. No radiopaque stones. Degenerative changes in the spine. IMPRESSION: Gaseous distention of small bowel with air-fluid levels consistent with small bowel obstruction. Electronically Signed   By: Lucienne Capers M.D.   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  Began systemic chemotherapy consisting of Abraxane/Gemzar days 1, 8, 15 every 28 days on 12/09/2015.  He also has a history of TB, treated by [facility] and history of MAC.  Oncology history is updated.  Pre-treatment labs today as ordered CBC diff, CMET, CA 19-9.  I personally reviewed and went over laboratory results with the patient.  The results are noted within this dictation.  Labs satisfy treatment parameters.    He reports issues with sleeping, "but Dr. Whitney Muse gave me an prescription for an antidepressant.  I am not depressed.Marland KitchenMarland KitchenI don't think."  He reports taking the medication one day and he did not like the way it made him feel.  He has not taken the medication in quite some time as a result.  He continues to have issues with insomnia.  He is using OTC medication without improvement.  We will try a low-dose Restoril to see if that is helpful.  Burtis Junes, RD is seeing the patient today for 2 lb weight loss.  He has imaging on 7/12 in the ED following cycle #2.  Restaging imaging will be held off for now and reconsidered following cycle #4.  He will return  in 4 weeks for follow-up and continued treatment.   ORDERS PLACED FOR THIS ENCOUNTER: Orders Placed This Encounter  Procedures  . Cancer antigen 19-9    MEDICATIONS PRESCRIBED THIS ENCOUNTER: Meds ordered this encounter  Medications  . temazepam (RESTORIL) 15 MG capsule    Sig: Take 1 capsule (15 mg total) by mouth at bedtime as needed for sleep.    Dispense:  30 capsule    Refill:  1    Order Specific Question:   Supervising Provider    Answer:   Patrici Ranks [id number]  . Oxycodone HCl 10 MG TABS    Sig: Take 1 tablet (10 mg total) by mouth every 4 (four) hours as needed.    Dispense:  90 tablet    Refill:  0    4 week supply    Order Specific Question:   Supervising Provider    Answer:   Patrici Ranks [id number]    THERAPY PLAN:  Continue palliative treatment as outlined above and continued discussions regarding goals of care.  All questions were answered. The patient knows to call the clinic with any problems, questions or concerns. We can certainly see the patient much sooner if necessary.  Patient and plan discussed with Dr. Ancil Linsey and she is in agreement with the aforementioned.   This note is electronically signed by: Doy Mince 02/12/2016 6:46 PM

## 2016-02-13 LAB — CANCER ANTIGEN 19-9: CA 19-9: 961 U/mL — ABNORMAL HIGH (ref 0–35)

## 2016-02-19 ENCOUNTER — Encounter (HOSPITAL_BASED_OUTPATIENT_CLINIC_OR_DEPARTMENT_OTHER): Payer: Medicaid Other

## 2016-02-19 ENCOUNTER — Encounter (HOSPITAL_COMMUNITY): Payer: Self-pay

## 2016-02-19 VITALS — BP 114/71 | HR 83 | Temp 98.1°F | Resp 18 | Wt 113.2 lb

## 2016-02-19 DIAGNOSIS — C259 Malignant neoplasm of pancreas, unspecified: Secondary | ICD-10-CM

## 2016-02-19 DIAGNOSIS — Z5111 Encounter for antineoplastic chemotherapy: Secondary | ICD-10-CM

## 2016-02-19 DIAGNOSIS — C258 Malignant neoplasm of overlapping sites of pancreas: Secondary | ICD-10-CM

## 2016-02-19 DIAGNOSIS — K869 Disease of pancreas, unspecified: Secondary | ICD-10-CM | POA: Diagnosis not present

## 2016-02-19 DIAGNOSIS — C257 Malignant neoplasm of other parts of pancreas: Secondary | ICD-10-CM

## 2016-02-19 DIAGNOSIS — C787 Secondary malignant neoplasm of liver and intrahepatic bile duct: Secondary | ICD-10-CM

## 2016-02-19 LAB — CBC WITH DIFFERENTIAL/PLATELET
Basophils Absolute: 0.1 10*3/uL (ref 0.0–0.1)
Basophils Relative: 1 %
EOS PCT: 3 %
Eosinophils Absolute: 0.1 10*3/uL (ref 0.0–0.7)
HCT: 31.1 % — ABNORMAL LOW (ref 39.0–52.0)
Hemoglobin: 10.7 g/dL — ABNORMAL LOW (ref 13.0–17.0)
LYMPHS PCT: 36 %
Lymphs Abs: 1.4 10*3/uL (ref 0.7–4.0)
MCH: 33.6 pg (ref 26.0–34.0)
MCHC: 34.4 g/dL (ref 30.0–36.0)
MCV: 97.8 fL (ref 78.0–100.0)
MONO ABS: 0.4 10*3/uL (ref 0.1–1.0)
Monocytes Relative: 9 %
Neutro Abs: 1.9 10*3/uL (ref 1.7–7.7)
Neutrophils Relative %: 51 %
PLATELETS: 284 10*3/uL (ref 150–400)
RBC: 3.18 MIL/uL — AB (ref 4.22–5.81)
RDW: 17.5 % — ABNORMAL HIGH (ref 11.5–15.5)
WBC: 3.8 10*3/uL — ABNORMAL LOW (ref 4.0–10.5)

## 2016-02-19 LAB — COMPREHENSIVE METABOLIC PANEL
ALT: 53 U/L (ref 17–63)
AST: 36 U/L (ref 15–41)
Albumin: 3.6 g/dL (ref 3.5–5.0)
Alkaline Phosphatase: 70 U/L (ref 38–126)
Anion gap: 5 (ref 5–15)
BUN: 8 mg/dL (ref 6–20)
CALCIUM: 8.8 mg/dL — AB (ref 8.9–10.3)
CHLORIDE: 99 mmol/L — AB (ref 101–111)
CO2: 27 mmol/L (ref 22–32)
CREATININE: 0.46 mg/dL — AB (ref 0.61–1.24)
Glucose, Bld: 176 mg/dL — ABNORMAL HIGH (ref 65–99)
Potassium: 3.9 mmol/L (ref 3.5–5.1)
Sodium: 131 mmol/L — ABNORMAL LOW (ref 135–145)
TOTAL PROTEIN: 6.9 g/dL (ref 6.5–8.1)
Total Bilirubin: 0.3 mg/dL (ref 0.3–1.2)

## 2016-02-19 MED ORDER — SODIUM CHLORIDE 0.9 % IV SOLN
Freq: Once | INTRAVENOUS | Status: AC
  Administered 2016-02-19: 12:00:00 via INTRAVENOUS
  Filled 2016-02-19: qty 4

## 2016-02-19 MED ORDER — SODIUM CHLORIDE 0.9 % IV SOLN
Freq: Once | INTRAVENOUS | Status: AC
  Administered 2016-02-19: 12:00:00 via INTRAVENOUS

## 2016-02-19 MED ORDER — PACLITAXEL PROTEIN-BOUND CHEMO INJECTION 100 MG
125.0000 mg/m2 | Freq: Once | INTRAVENOUS | Status: AC
  Administered 2016-02-19: 200 mg via INTRAVENOUS
  Filled 2016-02-19: qty 20

## 2016-02-19 MED ORDER — SODIUM CHLORIDE 0.9 % IV SOLN
1000.0000 mg/m2 | Freq: Once | INTRAVENOUS | Status: AC
  Administered 2016-02-19: 1634 mg via INTRAVENOUS
  Filled 2016-02-19: qty 16.7

## 2016-02-19 MED ORDER — HEPARIN SOD (PORK) LOCK FLUSH 100 UNIT/ML IV SOLN
500.0000 [IU] | Freq: Once | INTRAVENOUS | Status: AC | PRN
  Administered 2016-02-19: 500 [IU]
  Filled 2016-02-19: qty 5

## 2016-02-19 MED ORDER — SODIUM CHLORIDE 0.9% FLUSH: 10.0000 mL | INTRAVENOUS | Status: DC | PRN

## 2016-02-19 NOTE — Progress Notes (Signed)
Tolerated tx w/o adverse reaction.  VSS.  Alert, in no distress.  Discharged ambulatory.  

## 2016-02-19 NOTE — Patient Instructions (Signed)
[facility] Discharge Instructions for Patients Receiving Chemotherapy   Beginning January 23rd 2017 lab work for the Cancer Center will be done in the  Main lab at [facility] on 1st floor. If you have a lab appointment with the Cancer Center please come in thru the  Main Entrance and check in at the main information desk   Today you received the following chemotherapy agents:  Abraxane and Gemzar.  If you develop nausea and vomiting, or diarrhea that is not controlled by your medication, call the clinic.  The clinic phone number is [phone]. Office hours are Monday-Friday 8:30am-5:00pm.  BELOW ARE SYMPTOMS THAT SHOULD BE REPORTED IMMEDIATELY:  *FEVER GREATER THAN 101.0 F  *CHILLS WITH OR WITHOUT FEVER  NAUSEA AND VOMITING THAT IS NOT CONTROLLED WITH YOUR NAUSEA MEDICATION  *UNUSUAL SHORTNESS OF BREATH  *UNUSUAL BRUISING OR BLEEDING  TENDERNESS IN MOUTH AND THROAT WITH OR WITHOUT PRESENCE OF ULCERS  *URINARY PROBLEMS  *BOWEL PROBLEMS  UNUSUAL RASH Items with * indicate a potential emergency and should be followed up as soon as possible. If you have an emergency after office hours please contact your primary care physician or go to the nearest emergency department.  Please call the clinic during office hours if you have any questions or concerns.   You may also contact the Patient Navigator at [phone] should you have any questions or need assistance in obtaining follow up care.      Resources For Cancer Patients and their Caregivers ? American Cancer Society: Can assist with transportation, wigs, general needs, runs Look Good Feel Better.        1-888-227-6333 ? Cancer Care: Provides financial assistance, online support groups, medication/co-pay assistance.  1-800-813-HOPE (4673) ? [facility] Assists [place] cancer patients and their families through emotional , educational and financial support.   336-427-4357 ? [place] DSS Where to apply for food stamps, Medicaid and utility assistance. 336-342-1394 ? RCATS: Transportation to medical appointments. 336-347-2287 ? Social Security Administration: May apply for disability if have a Stage IV cancer. 336-342-7796 1-800-772-1213 ? [place] Aging, Disability and Transit Services: Assists with nutrition, care and transit needs. 336-349-2343         

## 2016-02-24 ENCOUNTER — Encounter: Payer: Self-pay | Admitting: *Deleted

## 2016-02-24 NOTE — Progress Notes (Signed)
[facility] Psychosocial Distress Screening Clinical Social Work  Clinical Social Work was referred by distress screening protocol.  The patient scored a 5 on the Psychosocial Distress Thermometer which indicates moderate distress. Clinical Social Worker phoned pt and reviewed chart to assess for distress and other psychosocial needs. Per chart review, PA has addressed his sleep/insomina. CSW left brief message for pt introducing CSW and explaining role of CSW. CSW encouraged pt to return CSW call if needed.   ONCBCN DISTRESS SCREENING 02/19/2016  Screening Type Initial Screening  Distress experienced in past week (1-10) 5  Emotional problem type Nervousness/Anxiety  Physical Problem type Sleep/insomnia    Clinical Social Worker follow up needed: No.  If yes, follow up plan:  Nicholas [name], [place] Tuesdays   [phone]

## 2016-03-01 ENCOUNTER — Other Ambulatory Visit (HOSPITAL_COMMUNITY): Payer: Self-pay

## 2016-03-03 ENCOUNTER — Other Ambulatory Visit (HOSPITAL_COMMUNITY): Payer: Self-pay | Admitting: Oncology

## 2016-03-04 ENCOUNTER — Encounter (HOSPITAL_BASED_OUTPATIENT_CLINIC_OR_DEPARTMENT_OTHER): Payer: Medicaid Other | Admitting: Hematology & Oncology

## 2016-03-04 ENCOUNTER — Encounter: Payer: Self-pay | Admitting: Dietician

## 2016-03-04 ENCOUNTER — Encounter (HOSPITAL_COMMUNITY): Payer: Medicaid Other

## 2016-03-04 ENCOUNTER — Encounter (HOSPITAL_COMMUNITY): Payer: Self-pay | Admitting: Hematology & Oncology

## 2016-03-04 VITALS — BP 123/75 | HR 111 | Temp 98.2°F | Resp 16 | Wt 112.8 lb

## 2016-03-04 VITALS — BP 149/72 | HR 97 | Temp 97.9°F | Resp 18

## 2016-03-04 DIAGNOSIS — R739 Hyperglycemia, unspecified: Secondary | ICD-10-CM

## 2016-03-04 DIAGNOSIS — A310 Pulmonary mycobacterial infection: Secondary | ICD-10-CM | POA: Diagnosis not present

## 2016-03-04 DIAGNOSIS — C257 Malignant neoplasm of other parts of pancreas: Secondary | ICD-10-CM

## 2016-03-04 DIAGNOSIS — K869 Disease of pancreas, unspecified: Secondary | ICD-10-CM | POA: Diagnosis not present

## 2016-03-04 DIAGNOSIS — Z5111 Encounter for antineoplastic chemotherapy: Secondary | ICD-10-CM | POA: Diagnosis not present

## 2016-03-04 DIAGNOSIS — R197 Diarrhea, unspecified: Secondary | ICD-10-CM | POA: Diagnosis not present

## 2016-03-04 DIAGNOSIS — C258 Malignant neoplasm of overlapping sites of pancreas: Secondary | ICD-10-CM | POA: Diagnosis not present

## 2016-03-04 DIAGNOSIS — G893 Neoplasm related pain (acute) (chronic): Secondary | ICD-10-CM | POA: Diagnosis not present

## 2016-03-04 DIAGNOSIS — Z720 Tobacco use: Secondary | ICD-10-CM

## 2016-03-04 DIAGNOSIS — C259 Malignant neoplasm of pancreas, unspecified: Secondary | ICD-10-CM

## 2016-03-04 LAB — CBC WITH DIFFERENTIAL/PLATELET
BASOS PCT: 0 %
Basophils Absolute: 0 10*3/uL (ref 0.0–0.1)
EOS ABS: 0.2 10*3/uL (ref 0.0–0.7)
Eosinophils Relative: 2 %
HCT: 33.2 % — ABNORMAL LOW (ref 39.0–52.0)
HEMOGLOBIN: 11.4 g/dL — AB (ref 13.0–17.0)
LYMPHS ABS: 1.7 10*3/uL (ref 0.7–4.0)
Lymphocytes Relative: 20 %
MCH: 33.9 pg (ref 26.0–34.0)
MCHC: 34.3 g/dL (ref 30.0–36.0)
MCV: 98.8 fL (ref 78.0–100.0)
Monocytes Absolute: 1.3 10*3/uL — ABNORMAL HIGH (ref 0.1–1.0)
Monocytes Relative: 15 %
NEUTROS PCT: 63 %
Neutro Abs: 5.3 10*3/uL (ref 1.7–7.7)
Platelets: 307 10*3/uL (ref 150–400)
RBC: 3.36 MIL/uL — AB (ref 4.22–5.81)
RDW: 19 % — ABNORMAL HIGH (ref 11.5–15.5)
WBC: 8.4 10*3/uL (ref 4.0–10.5)

## 2016-03-04 LAB — COMPREHENSIVE METABOLIC PANEL
ALBUMIN: 3.8 g/dL (ref 3.5–5.0)
ALK PHOS: 71 U/L (ref 38–126)
ALT: 41 U/L (ref 17–63)
AST: 33 U/L (ref 15–41)
Anion gap: 8 (ref 5–15)
BUN: 9 mg/dL (ref 6–20)
CALCIUM: 9.3 mg/dL (ref 8.9–10.3)
CO2: 27 mmol/L (ref 22–32)
CREATININE: 0.53 mg/dL — AB (ref 0.61–1.24)
Chloride: 99 mmol/L — ABNORMAL LOW (ref 101–111)
GFR calc Af Amer: >60 mL/min (ref >60)
GFR calc non Af Amer: >60 mL/min (ref >60)
GLUCOSE: 200 mg/dL — AB (ref 65–99)
Potassium: 3.9 mmol/L (ref 3.5–5.1)
SODIUM: 134 mmol/L — AB (ref 135–145)
Total Bilirubin: 0.5 mg/dL (ref 0.3–1.2)
Total Protein: 7.3 g/dL (ref 6.5–8.1)

## 2016-03-04 MED ORDER — SODIUM CHLORIDE 0.9 % IV SOLN
Freq: Once | INTRAVENOUS | Status: AC
  Administered 2016-03-04: 11:00:00 via INTRAVENOUS
  Filled 2016-03-04: qty 4

## 2016-03-04 MED ORDER — PACLITAXEL PROTEIN-BOUND CHEMO INJECTION 100 MG
125.0000 mg/m2 | Freq: Once | INTRAVENOUS | Status: AC
  Administered 2016-03-04: 200 mg via INTRAVENOUS
  Filled 2016-03-04: qty 40

## 2016-03-04 MED ORDER — HEPARIN SOD (PORK) LOCK FLUSH 100 UNIT/ML IV SOLN
500.0000 [IU] | Freq: Once | INTRAVENOUS | Status: AC | PRN
  Administered 2016-03-04: 500 [IU]

## 2016-03-04 MED ORDER — HEPARIN SOD (PORK) LOCK FLUSH 100 UNIT/ML IV SOLN
INTRAVENOUS | Status: AC
  Filled 2016-03-04: qty 5

## 2016-03-04 MED ORDER — OXYCODONE HCL 10 MG PO TABS: 10.0000 mg | ORAL_TABLET | ORAL | 0 refills | Status: DC | PRN

## 2016-03-04 MED ORDER — SODIUM CHLORIDE 0.9 % IV SOLN
Freq: Once | INTRAVENOUS | Status: AC
  Administered 2016-03-04: 11:00:00 via INTRAVENOUS

## 2016-03-04 MED ORDER — SODIUM CHLORIDE 0.9 % IV SOLN
1000.0000 mg/m2 | Freq: Once | INTRAVENOUS | Status: AC
  Administered 2016-03-04: 1634 mg via INTRAVENOUS
  Filled 2016-03-04: qty 42.98

## 2016-03-04 MED ORDER — OXYCODONE HCL ER 15 MG PO T12A: 15.0000 mg | EXTENDED_RELEASE_TABLET | Freq: Two times a day (BID) | ORAL | 0 refills | Status: DC

## 2016-03-04 MED ORDER — SODIUM CHLORIDE 0.9% FLUSH
10.0000 mL | INTRAVENOUS | Status: DC | PRN
  Administered 2016-03-04: 10 mL
  Filled 2016-03-04: qty 10

## 2016-03-04 NOTE — Patient Instructions (Addendum)
[place] at [facility] Discharge Instructions  RECOMMENDATIONS MADE BY THE CONSULTANT AND ANY TEST RESULTS WILL BE SENT TO YOUR REFERRING PHYSICIAN.  You saw Dr. Whitney Muse today. Next follow up is same day as next cycle with labs.  Thank you for choosing [place] at [facility] to provide your oncology and hematology care.  To afford each patient quality time with our provider, please arrive at least 15 minutes before your scheduled appointment time.   Beginning January 23rd 2017 lab work for the [facility] will be done in the  Main lab at [facility] on 1st floor. If you have a lab appointment with the [facility] please come in thru the  Main Entrance and check in at the main information desk  You need to re-schedule your appointment should you arrive 10 or more minutes late.  We strive to give you quality time with our providers, and arriving late affects you and other patients whose appointments are after yours.  Also, if you no show three or more times for appointments you may be dismissed from the clinic at the providers discretion.     Again, thank you for choosing [facility].  Our hope is that these requests will decrease the amount of time that you wait before being seen by our physicians.       _____________________________________________________________  Should you have questions after your visit to [facility], please contact our office at [phone] between the hours of 8:30 a.m. and 4:30 p.m.  Voicemails left after 4:30 p.m. will not be returned until the following business day.  For prescription refill requests, have your pharmacy contact our office.         Resources For Cancer Patients and their Caregivers ? American Cancer Society: Can assist with transportation, wigs, general needs, runs Look Good Feel Better.        779-832-2116 ? Cancer Care: Provides financial assistance, online  support groups, medication/co-pay assistance.  1-800-813-HOPE (306) 480-1622) ? [facility] Assists [place] cancer patients and their families through emotional , educational and financial support.  204-465-0483 ? [place] DSS Where to apply for food stamps, Medicaid and utility assistance. (574) 120-2261 ? RCATS: Transportation to medical appointments. (743)019-3116 ? Social Security Administration: May apply for disability if have a Stage IV cancer. (541)564-7777 (289) 337-4580 ? [employer], Disability and Transit Services: Assists with nutrition, care and transit needs. [place] Support Programs: @10RELATIVEDAYS @ > Cancer Support Group  2nd Tuesday of the month 1pm-2pm, Journey Room  > Creative Journey  3rd Tuesday of the month 1130am-1pm, Journey Room  > Look Good Feel Better  1st Wednesday of the month 10am-12 noon, Journey Room (Call [place] to register [phone])

## 2016-03-04 NOTE — Progress Notes (Signed)
[place]  Progress Note  Patient Care Team: Provider Not In System as PCP - General  CHIEF COMPLAINTS/PURPOSE OF CONSULTATION:  Adenocarcinoma of body/tail of pancreas, T4N0Mx History of TB, treated by [facility] History of MAC Weight Loss History Hyponatremia Tobacco Abuse    Pancreatic cancer ([place])   11/11/2015 - 11/13/2015 Hospital Admission    abdominal pain, cramping, diarrhea, weight loss, hyponatremia CT pancreatic body mass      11/12/2015 Imaging    4.7 cm mass of pancreas to L of midline, involvement of several vascular structures also possibly the distal duodenum      11/21/2015 - 11/25/2015 Hospital Admission    EUS on 5/23, hyponatremia      11/22/2015 Imaging    Diffuse mesenteric edema, large hypo enhancing pancreatic mass, abutment of celiac axis, SMA, encasement of splenic artery, occludes splenic vein, compresses porta splenic confluence      11/25/2015 Procedure    EUS Dr. Ardis Hughs, irreg mass in pancreatic body/tail. 4.2 cm, involvement of major blood vessels not appreciated due to size of mass      11/25/2015 Pathology Results    malignant cells c/w adenocarcinoma      12/04/2015 Miscellaneous    Acid Fast Smear negative      12/04/2015 Imaging    No convincing evidence of pulm mets, improvement in LLL, RUL angular nodular thickening seen on comparison CT from 2013, residual cavitation remains at the L lung base, one nodule oblique fissue on R 55m new      12/08/2015 Procedure    Port a cath      12/09/2015 -  Chemotherapy    Gemzar/Abraxane days 1, 8, and 15 every 28 days.      12/12/2015 Imaging    MRI abd- Limited MRI, D/C prior to completion at patient request. Large infiltrative 8.1 x 4.7 x 5.8 cm pancreatic mass centered at the junction of the pancreatic body and tail, consistent with known primary pancreatic adenocarc      12/23/2015 Treatment Plan Change    Treatment deferred x 7 days due to thrombocytopenia      01/14/2016 Imaging    CT abd/pelvis- Mass in the body/ tail of the pancreas consistent with known pancreatic carcinoma. Likely metastases to the liver demonstrating some progression since previous study.       HISTORY OF PRESENTING ILLNESS:  Nicholas [id number]. male is here for additional follow-up of locally advanced pancreatic cancer. Not felt to be a surgical candidate.   Patient is unaccompanied, laying in treatment chair, and has a pack of cigarettes in chest pocket. He is on cycle 4 of abraxane/gemcitabine.   He denies vomiting and acid reflux.  Patient states that he is drinking ensure and boost.  Miralax has been working great, he notes that his bowels are regular.   He experiences a sharp left abdominal pain every once in a while, which he describes as a cramp.   He states that he still feels gripey and sour and that makes him not want to eat. He said he eats a little bit at a time some days but some days he feels great and eats big meals (i.e.. Oysters, shrimp, fried chicken). On a bad day,he usually eats cereal for breakfast, a sandwich (pimento cheese) for lunch, and soup for dinner.   Patient says he does not feel nauseas. He states just feels sour and has diarrhea once or twice a week. He drinks a  lot of Gatorade and says milk soothes his stomach.  Pain is better.He takes sleeping pills sometimes to help him sleep at night but still wakes up eery few hours feeling wide awake and then go back to sleep.When he wakes up, he drinks a glass of milk and takes a pain pill then deep sleeps until 9 am. He still feels drowsy when he wakes up at 9 am.  Patient states he wants to go to back to work.   Today he walked two miles and ate breakfast. He is walking to try to stimulate appetite and sleep.. Last week he did not have chemo and he ate a lot. He feels fatigued this week and has a queasy stomach.   When asked if he feels less nauseas when he is not getting chemo, patient says  that the weeks about the same when he gets it and when he doesn't.   Patient is taking pancreatic enzymes before meals.   He states he is not smoking as much (less than a pack a day).  Patient says he is on last case of ensure and would like more, or even to see if he can get coupons.    MEDICAL HISTORY:  Past Medical History:  Diagnosis Date  . Abdominal pain in male    "was told he has a Mass near pancreas"  . Cancer (HCC)    Skin cancer-ear, skin cancer -leeg,cancer of leg muscle- left., basal cell   . Pancreatic cancer ([place]) 12/02/2015  . Pancreatic mass april 2017  . TB (pulmonary tuberculosis)    3 yrs ago-tx" positive TB test aslso" was told he is not contagious.treatment done for regulat TB.    SURGICAL HISTORY: Past Surgical History:  Procedure Laterality Date  . COLONOSCOPY W/ POLYPECTOMY    . EUS N/A 11/25/2015   Procedure: ESOPHAGEAL ENDOSCOPIC ULTRASOUND (EUS) RADIAL;  Surgeon: Milus Banister, MD;  Location: [facility] ENDOSCOPY;  Service: Endoscopy;  Laterality: N/A;  . LEG SURGERY Left    " cancer removed from muscle"    SOCIAL HISTORY: Social History   Social History  . Marital status: Single    Spouse name: N/A  . Number of children: N/A  . Years of education: N/A   Occupational History  . Not on file.   Social History Main Topics  . Smoking status: Current Every Day Smoker    Packs/day: 0.50    Years: 30.00    Types: Cigarettes  . Smokeless tobacco: Never Used  . Alcohol use No     Comment: 1-2 beers at night - pt states he can't remember the last time he drank any alcohol  . Drug use: No  . Sexual activity: No   Other Topics Concern  . Not on file   Social History Narrative  . No narrative on file   Single. He has been engaged 4 times. No children He has cut his smoking back since he had tuberculosis. He has smoked more with recent pain. Sometimes 1 ppd, "it varies". Since last Thursday, he has had 2 beers. It does not taste right. He has  never had acid reflux before, but now he does. He was in the [employer]. Worked in Office manager, [place], and the post office.   FAMILY HISTORY: Family History  Problem Relation Age of Onset  . Stroke Mother   . Pneumonia Father    Mother living 52 years old at [employer] [facility] and rehabilitation center.  Father deceased at 35 years  old of pneumonia. He drank and drank a lot. [employer] English as a second language teacher. Had a massive heart attack at 88 yo 1 sister, lives in [place]:  has No Known Allergies.  MEDICATIONS:  Current Outpatient Prescriptions  Medication Sig Dispense Refill  . aspirin EC 81 MG tablet Take 81 mg by mouth daily.    . feeding supplement, ENSURE ENLIVE, (ENSURE ENLIVE) LIQD Take 237 mLs by mouth 2 (two) times daily between meals. 60 Bottle 0  . Gemcitabine HCl (GEMZAR IV) Inject into the vein. To begin June 6    . hydrocerin (EUCERIN) CREA Apply 1 application topically 2 (two) times daily as needed (For dry skin.).    Marland Kitchen lidocaine-prilocaine (EMLA) cream Apply a quarter size amount to port site 1 hour prior to chemo. Do not rub in. Cover with plastic wrap. 30 g 3  . lipase/protease/amylase (CREON) 36000 UNITS CPEP capsule Take 1 capsule (36,000 Units total) by mouth 3 (three) times daily before meals. 180 capsule 3  . Menthol-Methyl Salicylate (THERA-GESIC) 1-15 % CREA Apply 1 application topically 4 (four) times daily as needed (For pain.).    Marland Kitchen mirtazapine (REMERON) 15 MG tablet Take on tablet po qhs for 5 days then increase to two tablets po qhs 60 tablet 3  . Multiple Vitamins-Minerals (MULTIVITAMINS THER. W/MINERALS) TABS tablet Take 1 tablet by mouth daily. Reported on 12/09/2015    . ondansetron (ZOFRAN) 8 MG tablet Take 1 tablet (8 mg total) by mouth every 8 (eight) hours as needed for nausea or vomiting. 30 tablet 2  . oxyCODONE (OXYCONTIN) 15 mg 12 hr tablet Take 1 tablet (15 mg total) by mouth every 12 (twelve) hours. 60 tablet 0  . Oxycodone HCl 10 MG TABS Take 1  tablet (10 mg total) by mouth every 4 (four) hours as needed. 90 tablet 0  . PACLitaxel Protein-Bound Part (ABRAXANE IV) Inject into the vein. To begin June 6    . polyethylene glycol (GOLYTELY) 236 g solution Take 4,000 mLs by mouth once. 4000 mL 0  . polyethylene glycol (MIRALAX / GLYCOLAX) packet Take 17 g by mouth daily. (Patient taking differently: Take 17 g by mouth daily as needed for mild constipation. ) 14 each 0  . prochlorperazine (COMPAZINE) 10 MG tablet Take 1 tablet (10 mg total) by mouth every 6 (six) hours as needed for nausea or vomiting. 30 tablet 2  . senna-docusate (SENOKOT S) 8.6-50 MG tablet Take 2 tablets by mouth daily.    . temazepam (RESTORIL) 15 MG capsule Take 1 capsule (15 mg total) by mouth at bedtime as needed for sleep. 30 capsule 1   No current facility-administered medications for this visit.    Facility-Administered Medications Ordered in Other Visits  Medication Dose Route Frequency Provider Last Rate Last Dose  . gemcitabine (GEMZAR) 1,634 mg in sodium chloride 0.9 % 100 mL chemo infusion  1,000 mg/m2 (Treatment Plan Recorded) Intravenous Once Patrici Ranks, MD      . heparin lock flush 100 unit/mL  500 Units Intracatheter Once PRN Patrici Ranks, MD      . PACLitaxel-protein bound (ABRAXANE) chemo infusion 200 mg  125 mg/m2 (Treatment Plan Recorded) Intravenous Once Patrici Ranks, MD      . sodium chloride flush (NS) 0.9 % injection 10 mL  10 mL Intracatheter PRN Patrici Ranks, MD   10 mL at 03/04/16 0935    Review of Systems  Constitutional: Positive for malaise/fatigue.  HENT: Negative.   Eyes: Negative.  Respiratory: Positive for shortness of breath.        Chronic SOB   Cardiovascular: Negative.   Gastrointestinal: Positive for diarrhea and heartburn. Negative for abdominal pain.       Diarrhea once to twice a week Intermittent abdominal pain (improved) and cramping  Genitourinary: Negative.   Musculoskeletal: Negative.   Skin:  Negative.   Neurological: Negative.   Endo/Heme/Allergies: Negative.   Psychiatric/Behavioral: The patient has insomnia. The patient is not nervous/anxious.   All other systems reviewed and are negative. 14 point ROS was done and is otherwise as detailed above or in HPI    PHYSICAL EXAMINATION: ECOG PERFORMANCE STATUS: 1 - Symptomatic but completely ambulatory  Vitals:   03/04/16 0945  BP: 123/75  Pulse: (!) 111  Resp: 16  Temp: 98.2 F (36.8 C)   Filed Weights   03/04/16 0945  Weight: 112 lb 12.8 oz (51.2 kg)    Physical Exam  Constitutional: He is oriented to person, place, and time.  Thin, cachexic, pleasant and appropriate.  HENT:  Head: Normocephalic and atraumatic.  Mouth/Throat: Oropharynx is clear and moist.  Eyes: Conjunctivae and EOM are normal. Pupils are equal, round, and reactive to light. Right eye exhibits no discharge. Left eye exhibits no discharge. No scleral icterus.  Neck: Normal range of motion. Neck supple. No JVD present. No tracheal deviation present. No thyromegaly present.  Cardiovascular: Normal rate and regular rhythm.   Murmur heard. Pulmonary/Chest: Effort normal. No stridor.  Coarse breath sounds bilaterally  Abdominal: Soft. Bowel sounds are normal. He exhibits no distension and no mass. There is no tenderness. There is no rebound and no guarding.  Musculoskeletal: Normal range of motion. He exhibits no edema or tenderness.  Lymphadenopathy:    He has no cervical adenopathy.  Neurological: He is alert and oriented to person, place, and time. Gait normal.  Skin: Skin is warm and dry.  Psychiatric: Mood, memory, affect and judgment normal.  Nursing note and vitals reviewed.   LABORATORY DATA:  I have reviewed the data as listed Lab Results  Component Value Date   WBC 8.4 03/04/2016   HGB 11.4 (L) 03/04/2016   HCT 33.2 (L) 03/04/2016   MCV 98.8 03/04/2016   PLT 307 03/04/2016   CMP     Component Value Date/Time   NA 134 (L)  03/04/2016 0937   K 3.9 03/04/2016 0937   CL 99 (L) 03/04/2016 0937   CO2 27 03/04/2016 0937   GLUCOSE 200 (H) 03/04/2016 0937   BUN 9 03/04/2016 0937   CREATININE 0.53 (L) 03/04/2016 0937   CALCIUM 9.3 03/04/2016 0937   PROT 7.3 03/04/2016 0937   ALBUMIN 3.8 03/04/2016 0937   AST 33 03/04/2016 0937   ALT 41 03/04/2016 0937   ALKPHOS 71 03/04/2016 0937   BILITOT 0.5 03/04/2016 0937   GFRNONAA >60 03/04/2016 0937   GFRAA >60 03/04/2016 0937   Results for Nicholas [name], Nicholas [name] (MRN [id number])   Ref. Range 12/04/2015 15:07 12/16/2015 09:23 01/21/2016 08:57 02/12/2016 10:09 03/04/2016 09:37  CA 19-9 Latest Ref Range: 0 - 35 U/mL 1,814 (H) 2,255 (H) 1,367 (H) 961 (H) 548 (H)      PATHOLOGY:     RADIOGRAPHIC STUDIES: I have personally reviewed the radiological images as listed and agreed with the findings in the report.  Study Result   CLINICAL DATA:  Abdominal pain. Last bowel movement yesterday. History of pancreatic cancer.  EXAM: CT ABDOMEN AND PELVIS WITH CONTRAST  TECHNIQUE: Multidetector CT imaging of  the abdomen and pelvis was performed using the standard protocol following bolus administration of intravenous contrast.  CONTRAST:  181m ISOVUE-300 IOPAMIDOL (ISOVUE-300) INJECTION 61%  COMPARISON:  11/22/2015  FINDINGS: Lower esophageal wall thickening suggesting reflux disease. Calcified nodules in the left lung base again demonstrated. Diffuse emphysematous changes in the lungs.  There is a hypo enhancing mass involving the body and tail of the pancreas, measuring about 3 x 6.7 cm. Margins of the lesion partially encircle the superior mesenteric artery with infiltration into the celiac axis. Adjacent lymph nodes are present. Changes are consistent with known pancreatic carcinoma and are similar to prior study. Focal hypo enhancing lesions demonstrated in the liver, largest measuring about 10 mm diameter. This is suspicious for hepatic metastasis. There is  slight progression since previous study. Small amount of free fluid throughout the upper abdomen, extending along the pericolic gutters to the pelvis. This is likely reactive fluid or ascites. Fluid and gaseous distention of small bowel with decompressed terminal ileum. Transition zone is not well defined but appears to be in the right lower quadrant. Cause is not determined although mesenteric lymph nodes are present which may contribute. Calcification of the abdominal aorta. No aneurysm. Suggestion of focal areas of stenosis demonstrated in the infrarenal abdominal aorta and in the proximal iliac arteries bilaterally.  Adrenal glands, spleen, kidneys, and inferior vena cava are unremarkable.  Pelvis: Bladder wall is mildly thickened, possibly due to under distention or cystitis. Appendix is not identified. No significant pelvic lymphadenopathy. Degenerative changes in the spine. No destructive bone lesions  IMPRESSION: Mass in the body/ tail of the pancreas consistent with known pancreatic carcinoma. Likely metastases to the liver demonstrating some progression since previous study. Diffuse free fluid throughout the abdomen and pelvis. Changes of small bowel obstruction with transition zone in the right lower quadrant. Etiology is not determined.   Electronically Signed   By: WLucienne CapersM.D.   On: 01/14/2016 04:33      EUS:        ASSESSMENT & PLAN:  Adenocarcinoma of body/tail of pancreas, T4N0M1, Stage IV disease [facility] History Hyponatremia Tobacco Abuse Cancer related pain Hypokalemia Hyperglycemia  He presents for ongoing Abraxane/Gemzar. Ca 19-9 is declining. Weight has stabilized. Unfortunately he has not gained. He spoke with NOvid Curd the nutritionist, today. NOvid Curdwill get the patient a case of Boost.  Pain medications were refilled.   End of life issues were addressed, he  continues to avoid them. I did tell him he will not be able to return to work. He seemed to expect this. He still remains full code.   Will also ask if he can get more ensure and coupons. He met with NOvid Curdtoday.   He will follow up on Sept. 21 with [place]. Blood sugars will be monitored closely moving forward. FBS may be needed.   Smoking cessation was addressed. He continues to express disinterest in quitting.   All questions were answered. The patient knows to call the clinic with any problems, questions or concerns.  This document serves as a record of services personally performed by SAncil Linsey MD. It was created on her behalf by DElmyra Ricks a trained medical scribe. The creation of this record is based on the scribe's personal observations and the provider's statements to them. This document has been checked and approved by the attending provider..  I have reviewed the above documentation for accuracy and completeness, and  I agree with the above.   This note was electronically signed.    Molli Hazard, MD  03/04/2016 11:02 AM

## 2016-03-04 NOTE — Progress Notes (Signed)
Nicholas [name] tolerated chemo tx well without complaints. VSS upon discharge. Pt discharged self ambulatory in satisfactory condition

## 2016-03-04 NOTE — Progress Notes (Signed)
Follow up with high risk pancreatic cancer patient  Contacted Pt by Visiting during his infusion   Wt Readings from Last 10 Encounters:  03/04/16 112 lb 12.8 oz (51.2 kg)  02/19/16 113 lb 3.2 oz (51.3 kg)  02/12/16 112 lb 1.6 oz (50.8 kg)  01/28/16 114 lb 9.6 oz (52 kg)  01/21/16 111 lb 9.6 oz (50.6 kg)  01/14/16 112 lb 3.2 oz (50.9 kg)  01/07/16 110 lb 4.8 oz (50 kg)  12/30/15 111 lb 14.4 oz (50.8 kg)  12/23/15 111 lb 14.4 oz (50.8 kg)  12/21/15 115 lb (52.2 kg)   Patient weight has been stable for the last two months, despite reports of very good intake and very high caloric intake, he has been unable to gain weight, potentially due to altered metabolism  Patient reports that 2 weeks ago, he had a very good appetite as usual. However, this last week he states his appetite disappears and he is not sure why. He gave an example of eating only 1 hotdog  For an entire day and another example of eating only a plate of gravy biscuits for a day. That said, he was referring to his meals only and he has continued to drink his Ensures at the same quantity, ~6 per day.   He notes one instance of a severe cramp and subsequent diarrhea. He says this was very acute and out of the ordinary, the next day his bowels were regular.  Unfortunately, he has received all 3 cases of Ensure that he is eligible for. He is very reliant on these.   RD went over how to make homemade Ensure. He is given a recipe that combines whole milk and powdered milk to make a comparable beverage, providing 211 kcals and 14 g Pro. He is recommended to add chocolate syrup to this to add more kcals and to make the beverage profile very similar to that of Ensure Plus.   Additionally, he is provided with many different samples and coupons to help him as best as able. 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 He  is very pleasant to talk with and today pt and RD had very genial conversation about his hobbies and past interests. 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   Left coupons, samples, and handouts titled "Making the most of every bite"  RD to continue to follow.   Burtis Junes RD, LDN, [facility] Pager: [phone] 03/04/2016 12:36 PM

## 2016-03-04 NOTE — Patient Instructions (Signed)
[facility] Discharge Instructions for Patients Receiving Chemotherapy   Beginning January 23rd 2017 lab work for the Cancer Center will be done in the  Main lab at [facility] on 1st floor. If you have a lab appointment with the Cancer Center please come in thru the  Main Entrance and check in at the main information desk   Today you received the following chemotherapy agents Abraxane and Gemzar. Follow-up as scheduled. Call clinic for any questions or concerns  To help prevent nausea and vomiting after your treatment, we encourage you to take your nausea medication   If you develop nausea and vomiting, or diarrhea that is not controlled by your medication, call the clinic.  The clinic phone number is [phone]. Office hours are Monday-Friday 8:30am-5:00pm.  BELOW ARE SYMPTOMS THAT SHOULD BE REPORTED IMMEDIATELY:  *FEVER GREATER THAN 101.0 F  *CHILLS WITH OR WITHOUT FEVER  NAUSEA AND VOMITING THAT IS NOT CONTROLLED WITH YOUR NAUSEA MEDICATION  *UNUSUAL SHORTNESS OF BREATH  *UNUSUAL BRUISING OR BLEEDING  TENDERNESS IN MOUTH AND THROAT WITH OR WITHOUT PRESENCE OF ULCERS  *URINARY PROBLEMS  *BOWEL PROBLEMS  UNUSUAL RASH Items with * indicate a potential emergency and should be followed up as soon as possible. If you have an emergency after office hours please contact your primary care physician or go to the nearest emergency department.  Please call the clinic during office hours if you have any questions or concerns.   You may also contact the Patient Navigator at [phone] should you have any questions or need assistance in obtaining follow up care.      Resources For Cancer Patients and their Caregivers ? American Cancer Society: Can assist with transportation, wigs, general needs, runs Look Good Feel Better.        1-888-227-6333 ? Cancer Care: Provides financial assistance, online support groups, medication/co-pay assistance.   1-800-813-HOPE (4673) ? [facility] Assists [place] cancer patients and their families through emotional , educational and financial support.  336-427-4357 ? [place] DSS Where to apply for food stamps, Medicaid and utility assistance. 336-342-1394 ? [facility]: Transportation to medical appointments. 336-347-2287 ? Social Security Administration: May apply for disability if have a Stage IV cancer. 336-342-7796 1-800-772-1213 ? [place] Aging, Disability and Transit Services: Assists with nutrition, care and transit needs. 336-349-2343         

## 2016-03-05 LAB — CANCER ANTIGEN 19-9: CA 19-9: 548 U/mL — ABNORMAL HIGH (ref 0–35)

## 2016-03-07 ENCOUNTER — Encounter (HOSPITAL_COMMUNITY): Payer: Self-pay | Admitting: Hematology & Oncology

## 2016-03-11 ENCOUNTER — Encounter (HOSPITAL_COMMUNITY): Payer: Medicaid Other | Attending: Hematology & Oncology

## 2016-03-11 VITALS — BP 125/68 | HR 73 | Temp 97.8°F | Resp 16 | Wt 113.8 lb

## 2016-03-11 DIAGNOSIS — R143 Flatulence: Secondary | ICD-10-CM | POA: Diagnosis present

## 2016-03-11 DIAGNOSIS — E876 Hypokalemia: Secondary | ICD-10-CM | POA: Insufficient documentation

## 2016-03-11 DIAGNOSIS — K869 Disease of pancreas, unspecified: Secondary | ICD-10-CM | POA: Insufficient documentation

## 2016-03-11 DIAGNOSIS — C257 Malignant neoplasm of other parts of pancreas: Secondary | ICD-10-CM | POA: Diagnosis not present

## 2016-03-11 DIAGNOSIS — C258 Malignant neoplasm of overlapping sites of pancreas: Secondary | ICD-10-CM | POA: Diagnosis present

## 2016-03-11 DIAGNOSIS — C251 Malignant neoplasm of body of pancreas: Secondary | ICD-10-CM | POA: Diagnosis present

## 2016-03-11 DIAGNOSIS — K5900 Constipation, unspecified: Secondary | ICD-10-CM | POA: Insufficient documentation

## 2016-03-11 DIAGNOSIS — C787 Secondary malignant neoplasm of liver and intrahepatic bile duct: Secondary | ICD-10-CM | POA: Diagnosis not present

## 2016-03-11 DIAGNOSIS — Z8611 Personal history of tuberculosis: Secondary | ICD-10-CM | POA: Insufficient documentation

## 2016-03-11 DIAGNOSIS — K7689 Other specified diseases of liver: Secondary | ICD-10-CM | POA: Diagnosis present

## 2016-03-11 DIAGNOSIS — Z5111 Encounter for antineoplastic chemotherapy: Secondary | ICD-10-CM | POA: Diagnosis not present

## 2016-03-11 DIAGNOSIS — C259 Malignant neoplasm of pancreas, unspecified: Secondary | ICD-10-CM

## 2016-03-11 LAB — CBC WITH DIFFERENTIAL/PLATELET
Basophils Absolute: 0 10*3/uL (ref 0.0–0.1)
Basophils Relative: 1 %
EOS PCT: 2 %
Eosinophils Absolute: 0.1 10*3/uL (ref 0.0–0.7)
HCT: 30.9 % — ABNORMAL LOW (ref 39.0–52.0)
Hemoglobin: 10.8 g/dL — ABNORMAL LOW (ref 13.0–17.0)
LYMPHS ABS: 1.3 10*3/uL (ref 0.7–4.0)
LYMPHS PCT: 36 %
MCH: 34.4 pg — AB (ref 26.0–34.0)
MCHC: 35 g/dL (ref 30.0–36.0)
MCV: 98.4 fL (ref 78.0–100.0)
MONO ABS: 0.4 10*3/uL (ref 0.1–1.0)
Monocytes Relative: 10 %
Neutro Abs: 1.9 10*3/uL (ref 1.7–7.7)
Neutrophils Relative %: 51 %
PLATELETS: 284 10*3/uL (ref 150–400)
RBC: 3.14 MIL/uL — ABNORMAL LOW (ref 4.22–5.81)
RDW: 17.2 % — AB (ref 11.5–15.5)
WBC: 3.7 10*3/uL — ABNORMAL LOW (ref 4.0–10.5)

## 2016-03-11 LAB — COMPREHENSIVE METABOLIC PANEL
ALBUMIN: 3.9 g/dL (ref 3.5–5.0)
ALK PHOS: 68 U/L (ref 38–126)
ALT: 50 U/L (ref 17–63)
AST: 38 U/L (ref 15–41)
Anion gap: 12 (ref 5–15)
BILIRUBIN TOTAL: 0.2 mg/dL — AB (ref 0.3–1.2)
BUN: 9 mg/dL (ref 6–20)
CALCIUM: 8.8 mg/dL — AB (ref 8.9–10.3)
CO2: 27 mmol/L (ref 22–32)
CREATININE: 0.46 mg/dL — AB (ref 0.61–1.24)
Chloride: 95 mmol/L — ABNORMAL LOW (ref 101–111)
GFR calc Af Amer: >60 mL/min (ref >60)
GFR calc non Af Amer: >60 mL/min (ref >60)
GLUCOSE: 179 mg/dL — AB (ref 65–99)
Potassium: 4.5 mmol/L (ref 3.5–5.1)
Sodium: 134 mmol/L — ABNORMAL LOW (ref 135–145)
TOTAL PROTEIN: 7.6 g/dL (ref 6.5–8.1)

## 2016-03-11 MED ORDER — PACLITAXEL PROTEIN-BOUND CHEMO INJECTION 100 MG
125.0000 mg/m2 | Freq: Once | INTRAVENOUS | Status: AC
  Administered 2016-03-11: 200 mg via INTRAVENOUS
  Filled 2016-03-11: qty 40

## 2016-03-11 MED ORDER — HEPARIN SOD (PORK) LOCK FLUSH 100 UNIT/ML IV SOLN
500.0000 [IU] | Freq: Once | INTRAVENOUS | Status: AC | PRN
  Administered 2016-03-11: 500 [IU]

## 2016-03-11 MED ORDER — SODIUM CHLORIDE 0.9 % IV SOLN
1000.0000 mg/m2 | Freq: Once | INTRAVENOUS | Status: AC
  Administered 2016-03-11: 1634 mg via INTRAVENOUS
  Filled 2016-03-11: qty 42.98

## 2016-03-11 MED ORDER — SODIUM CHLORIDE 0.9 % IV SOLN
Freq: Once | INTRAVENOUS | Status: AC
  Administered 2016-03-11: 12:00:00 via INTRAVENOUS
  Filled 2016-03-11: qty 4

## 2016-03-11 MED ORDER — SODIUM CHLORIDE 0.9 % IV SOLN
Freq: Once | INTRAVENOUS | Status: AC
  Administered 2016-03-11: 12:00:00 via INTRAVENOUS

## 2016-03-11 MED ORDER — SODIUM CHLORIDE 0.9% FLUSH: 10.0000 mL | INTRAVENOUS | Status: DC | PRN

## 2016-03-11 NOTE — Progress Notes (Signed)
Patient tolerated infusion well.  VSS.  Labs discussed with MD prior to treatment, ok to treat.  Patient ambulatory and stable at discharge from the clinic.

## 2016-03-11 NOTE — Patient Instructions (Signed)
[facility] Discharge Instructions for Patients Receiving Chemotherapy   Beginning January 23rd 2017 lab work for the [facility] will be done in the  Main lab at [facility] on 1st floor. If you have a lab appointment with the [facility] please come in thru the  Main Entrance and check in at the main information desk   Today you received the following chemotherapy agents: abraxane and gemzar.     If you develop nausea and vomiting, or diarrhea that is not controlled by your medication, call the clinic.  The clinic phone number is [phone]. Office hours are Monday-Friday 8:30am-5:00pm.  BELOW ARE SYMPTOMS THAT SHOULD BE REPORTED IMMEDIATELY:  *FEVER GREATER THAN 101.0 F  *CHILLS WITH OR WITHOUT FEVER  NAUSEA AND VOMITING THAT IS NOT CONTROLLED WITH YOUR NAUSEA MEDICATION  *UNUSUAL SHORTNESS OF BREATH  *UNUSUAL BRUISING OR BLEEDING  TENDERNESS IN MOUTH AND THROAT WITH OR WITHOUT PRESENCE OF ULCERS  *URINARY PROBLEMS  *BOWEL PROBLEMS  UNUSUAL RASH Items with * indicate a potential emergency and should be followed up as soon as possible. If you have an emergency after office hours please contact your primary care physician or go to the nearest emergency department.  Please call the clinic during office hours if you have any questions or concerns.   You may also contact the Patient Navigator at [phone] should you have any questions or need assistance in obtaining follow up care.      Resources For Cancer Patients and their Caregivers ? American Cancer Society: Can assist with transportation, wigs, general needs, runs Look Good Feel Better.        365-751-1150 ? Cancer Care: Provides financial assistance, online support groups, medication/co-pay assistance.  1-800-813-HOPE 425-412-2957) ? [facility] Assists [place] cancer patients and their families through emotional , educational and financial support.   ([phone] ? [place] DSS Where to apply for food stamps, Medicaid and utility assistance. (403)398-6091 ? RCATS: Transportation to medical appointments. 204-418-5395 ? Social Security Administration: May apply for disability if have a Stage IV cancer. 314-243-8787 [phone] ? [employer], Disability and Transit Services: Assists with nutrition, care and transit needs. [phone]

## 2016-03-16 ENCOUNTER — Telehealth (HOSPITAL_COMMUNITY): Payer: Self-pay

## 2016-03-16 NOTE — Telephone Encounter (Signed)
Called and left patient a message concerning his labs and instructed to call back if any further questions.

## 2016-03-16 NOTE — Telephone Encounter (Signed)
-----   Message from Epifanio Lesches sent at 03/12/2016 10:15 AM EDT ----- Wants lab results from 9/7. 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

## 2016-03-18 ENCOUNTER — Other Ambulatory Visit (HOSPITAL_COMMUNITY): Payer: Self-pay | Admitting: Oncology

## 2016-03-18 ENCOUNTER — Ambulatory Visit (HOSPITAL_COMMUNITY): Payer: Medicaid Other

## 2016-03-18 DIAGNOSIS — G893 Neoplasm related pain (acute) (chronic): Secondary | ICD-10-CM

## 2016-03-18 MED ORDER — OXYCODONE HCL 10 MG PO TABS: 10.0000 mg | ORAL_TABLET | ORAL | 0 refills | Status: DC | PRN

## 2016-03-19 ENCOUNTER — Other Ambulatory Visit (HOSPITAL_COMMUNITY): Payer: Self-pay | Admitting: Emergency Medicine

## 2016-03-19 DIAGNOSIS — G893 Neoplasm related pain (acute) (chronic): Secondary | ICD-10-CM

## 2016-03-19 MED ORDER — OXYCODONE HCL 10 MG PO TABS: 10.0000 mg | ORAL_TABLET | ORAL | 0 refills | Status: DC | PRN

## 2016-03-25 ENCOUNTER — Encounter (HOSPITAL_BASED_OUTPATIENT_CLINIC_OR_DEPARTMENT_OTHER): Payer: Medicaid Other

## 2016-03-25 ENCOUNTER — Encounter: Payer: Self-pay | Admitting: Dietician

## 2016-03-25 ENCOUNTER — Ambulatory Visit (HOSPITAL_COMMUNITY): Payer: Medicaid Other | Admitting: Oncology

## 2016-03-25 ENCOUNTER — Encounter (HOSPITAL_BASED_OUTPATIENT_CLINIC_OR_DEPARTMENT_OTHER): Payer: Medicaid Other | Admitting: Oncology

## 2016-03-25 ENCOUNTER — Encounter (HOSPITAL_COMMUNITY): Payer: Self-pay | Admitting: Oncology

## 2016-03-25 VITALS — BP 138/70 | HR 83 | Temp 98.2°F | Resp 18 | Wt 113.0 lb

## 2016-03-25 DIAGNOSIS — Z23 Encounter for immunization: Secondary | ICD-10-CM

## 2016-03-25 DIAGNOSIS — C787 Secondary malignant neoplasm of liver and intrahepatic bile duct: Secondary | ICD-10-CM

## 2016-03-25 DIAGNOSIS — C257 Malignant neoplasm of other parts of pancreas: Secondary | ICD-10-CM

## 2016-03-25 DIAGNOSIS — N529 Male erectile dysfunction, unspecified: Secondary | ICD-10-CM

## 2016-03-25 DIAGNOSIS — Z5111 Encounter for antineoplastic chemotherapy: Secondary | ICD-10-CM

## 2016-03-25 DIAGNOSIS — C259 Malignant neoplasm of pancreas, unspecified: Secondary | ICD-10-CM

## 2016-03-25 DIAGNOSIS — K869 Disease of pancreas, unspecified: Secondary | ICD-10-CM | POA: Diagnosis not present

## 2016-03-25 DIAGNOSIS — C258 Malignant neoplasm of overlapping sites of pancreas: Secondary | ICD-10-CM

## 2016-03-25 LAB — CBC WITH DIFFERENTIAL/PLATELET
BASOS ABS: 0 10*3/uL (ref 0.0–0.1)
BASOS PCT: 0 %
EOS ABS: 0.2 10*3/uL (ref 0.0–0.7)
EOS PCT: 3 %
HCT: 33.8 % — ABNORMAL LOW (ref 39.0–52.0)
Hemoglobin: 11.5 g/dL — ABNORMAL LOW (ref 13.0–17.0)
Lymphocytes Relative: 20 %
Lymphs Abs: 1.7 10*3/uL (ref 0.7–4.0)
MCH: 35 pg — ABNORMAL HIGH (ref 26.0–34.0)
MCHC: 34 g/dL (ref 30.0–36.0)
MCV: 102.7 fL — ABNORMAL HIGH (ref 78.0–100.0)
MONOS PCT: 12 %
Monocytes Absolute: 1 10*3/uL (ref 0.1–1.0)
NEUTROS ABS: 5.4 10*3/uL (ref 1.7–7.7)
NEUTROS PCT: 65 %
PLATELETS: 345 10*3/uL (ref 150–400)
RBC: 3.29 MIL/uL — AB (ref 4.22–5.81)
RDW: 18.3 % — ABNORMAL HIGH (ref 11.5–15.5)
WBC: 8.3 10*3/uL (ref 4.0–10.5)

## 2016-03-25 LAB — COMPREHENSIVE METABOLIC PANEL
ALT: 29 U/L (ref 17–63)
AST: 32 U/L (ref 15–41)
Albumin: 3.9 g/dL (ref 3.5–5.0)
Alkaline Phosphatase: 71 U/L (ref 38–126)
Anion gap: 8 (ref 5–15)
BUN: 10 mg/dL (ref 6–20)
CHLORIDE: 99 mmol/L — AB (ref 101–111)
CO2: 27 mmol/L (ref 22–32)
CREATININE: 0.59 mg/dL — AB (ref 0.61–1.24)
Calcium: 9 mg/dL (ref 8.9–10.3)
GFR calc non Af Amer: >60 mL/min (ref >60)
Glucose, Bld: 202 mg/dL — ABNORMAL HIGH (ref 65–99)
Potassium: 3.9 mmol/L (ref 3.5–5.1)
SODIUM: 134 mmol/L — AB (ref 135–145)
Total Bilirubin: 0.5 mg/dL (ref 0.3–1.2)
Total Protein: 7.4 g/dL (ref 6.5–8.1)

## 2016-03-25 MED ORDER — INFLUENZA VAC SPLIT QUAD 0.5 ML IM SUSY
0.5000 mL | PREFILLED_SYRINGE | Freq: Once | INTRAMUSCULAR | Status: AC
  Administered 2016-03-25: 0.5 mL via INTRAMUSCULAR

## 2016-03-25 MED ORDER — INFLUENZA VAC SPLIT QUAD 0.5 ML IM SUSY
PREFILLED_SYRINGE | INTRAMUSCULAR | Status: AC
  Filled 2016-03-25: qty 0.5

## 2016-03-25 MED ORDER — HEPARIN SOD (PORK) LOCK FLUSH 100 UNIT/ML IV SOLN
500.0000 [IU] | Freq: Once | INTRAVENOUS | Status: AC | PRN
  Administered 2016-03-25: 500 [IU]
  Filled 2016-03-25: qty 5

## 2016-03-25 MED ORDER — PACLITAXEL PROTEIN-BOUND CHEMO INJECTION 100 MG
125.0000 mg/m2 | Freq: Once | INTRAVENOUS | Status: AC
  Administered 2016-03-25: 200 mg via INTRAVENOUS
  Filled 2016-03-25: qty 40

## 2016-03-25 MED ORDER — SILDENAFIL CITRATE 100 MG PO TABS: 50.0000 mg | ORAL_TABLET | Freq: Every day | ORAL | 0 refills | Status: DC | PRN

## 2016-03-25 MED ORDER — SODIUM CHLORIDE 0.9 % IV SOLN
Freq: Once | INTRAVENOUS | Status: AC
  Administered 2016-03-25: 11:00:00 via INTRAVENOUS

## 2016-03-25 MED ORDER — SODIUM CHLORIDE 0.9% FLUSH
10.0000 mL | INTRAVENOUS | Status: DC | PRN
  Administered 2016-03-25: 10 mL
  Filled 2016-03-25: qty 10

## 2016-03-25 MED ORDER — SODIUM CHLORIDE 0.9 % IV SOLN
1000.0000 mg/m2 | Freq: Once | INTRAVENOUS | Status: AC
  Administered 2016-03-25: 1634 mg via INTRAVENOUS
  Filled 2016-03-25: qty 42.98

## 2016-03-25 MED ORDER — SODIUM CHLORIDE 0.9 % IV SOLN
Freq: Once | INTRAVENOUS | Status: AC
  Administered 2016-03-25: 11:00:00 via INTRAVENOUS
  Filled 2016-03-25 (×2): qty 4

## 2016-03-25 NOTE — Patient Instructions (Addendum)
[place] [facility] Discharge Instructions  RECOMMENDATIONS MADE BY THE CONSULTANT AND ANY TEST RESULTS WILL BE SENT TO YOUR REFERRING PHYSICIAN.  You were seen by Gershon Mussel today. Continue Unisom for sleep Prescription for Viagra given Return in 3 weeks for follow up   Thank you for choosing [place] at [facility] to provide your oncology and hematology care.  To afford each patient quality time with our provider, please arrive at least 15 minutes before your scheduled appointment time.   Beginning January 23rd 2017 lab work for the [facility] will be done in the  Main lab at [facility] on 1st floor. If you have a lab appointment with the [facility] please come in thru the  Main Entrance and check in at the main information desk  You need to re-schedule your appointment should you arrive 10 or more minutes late.  We strive to give you quality time with our providers, and arriving late affects you and other patients whose appointments are after yours.  Also, if you no show three or more times for appointments you may be dismissed from the clinic at the providers discretion.     Again, thank you for choosing [facility].  Our hope is that these requests will decrease the amount of time that you wait before being seen by our physicians.       _____________________________________________________________  Should you have questions after your visit to [facility], please contact our office at [phone] between the hours of 8:30 a.m. and 4:30 p.m.  Voicemails left after 4:30 p.m. will not be returned until the following business day.  For prescription refill requests, have your pharmacy contact our office.         Resources For Cancer Patients and their Caregivers ? American Cancer Society: Can assist with transportation, wigs, general needs, runs Look Good Feel Better.        617-603-0985 ? Cancer  Care: Provides financial assistance, online support groups, medication/co-pay assistance.  1-800-813-HOPE 731 740 7432) ? [facility] Assists [place] cancer patients and their families through emotional , educational and financial support.  ([phone] ? [place] DSS Where to apply for food stamps, Medicaid and utility assistance. 403-672-3796 ? RCATS: Transportation to medical appointments. [phone] ? Social Security Administration: May apply for disability if have a Stage IV cancer. [phone] 727-573-7588 ? [employer], Disability and Transit Services: Assists with nutrition, care and transit needs. [place] Support Programs: @10RELATIVEDAYS @ > Cancer Support Group  2nd Tuesday of the month 1pm-2pm, Journey Room  > Creative Journey  3rd Tuesday of the month 1130am-1pm, Journey Room  > Look Good Feel Better  1st Wednesday of the month 10am-12 noon, Journey Room (Call [place] to register [phone])

## 2016-03-25 NOTE — Assessment & Plan Note (Addendum)
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  Began systemic chemotherapy consisting of Abraxane/Gemzar days 1, 8, 15 every 28 days on 12/09/2015.  He also has a history of TB, treated by [facility] and history of MAC.  Oncology history is updated.  Pre-treatment labs today as ordered CBC diff, CMET, CA 19-9.  I personally reviewed and went over laboratory results with the patient.  The results are noted within this dictation.  Labs satisfy treatment parameters.    Burtis Junes, RD is following the patient.  Weight is stable.  Bowel movements are moving well with MiraLax.  No blood in stool.  He is using Unisom to help him sleep.  He notes that this is effective.  He requests a refill on Viagra which he typically gets from [place].  He is unable to get to the [place] soon to get this medication refilled.  RX is printed for Viagra 100 mg.  CT abd/pelvis is ordered and will be scheduled to be completed in ~ 3 weeks for restaging purposes.  Return in 3 weeks for follow-up and review of restaging tests.

## 2016-03-25 NOTE — Progress Notes (Signed)
Tolerated tx w/o adverse reaction.  A&Ox4, in no distress.  VSS.  Discharged ambulatory.  

## 2016-03-25 NOTE — Progress Notes (Signed)
F/U with pancreatic cancer pt.   Contacted Pt by visiting during his infusion  Wt Readings from Last 10 Encounters:  03/25/16 113 lb (51.3 kg)  03/11/16 113 lb 12.8 oz (51.6 kg)  03/04/16 112 lb 12.8 oz (51.2 kg)  02/19/16 113 lb 3.2 oz (51.3 kg)  02/12/16 112 lb 1.6 oz (50.8 kg)  01/28/16 114 lb 9.6 oz (52 kg)  01/21/16 111 lb 9.6 oz (50.6 kg)  01/14/16 112 lb 3.2 oz (50.9 kg)  01/07/16 110 lb 4.8 oz (50 kg)  12/30/15 111 lb 14.4 oz (50.8 kg)   Patient weight has stabilized. He is told that his cancer has very likely disrupted his metabolism and ability to store fat/muscle. He should not feel bad about not gaining weight as it is incredibly difficult.   Patient reports oral intake as Good, unchanged. Described some elaborate, large meals he has been eating. Denies any new symptoms. Still has occasional pains, but largely denies any d/v/n/c.   Pt asked for more Ensure and Coupons. These are gladly given.   Pt seems to be stable. He is in a good mood today. 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   Left coupons and samples for patient  Burtis Junes RD, LDN, [facility] Pager: [phone] 03/25/2016 4:36 PM

## 2016-03-25 NOTE — Progress Notes (Signed)
PROVIDER NOT IN SYSTEM No address on file  Malignant neoplasm of other parts of pancreas ([place]) - Plan: CT Abdomen Pelvis W Contrast  Erectile dysfunction, unspecified erectile dysfunction type - Plan: sildenafil (VIAGRA) 100 MG tablet  CURRENT THERAPY: Abraxane/Gemzar days 1, and 8 every 21 days beginning on 12/09/2015  INTERVAL HISTORY: Nicholas [name] 65 y.o. 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  Began systemic chemotherapy consisting of Abraxane/Gemzar days 1, 8, 15 every 28 days on 12/09/2015.  He also has a history of TB, treated by [facility] and history of MAC.    Pancreatic cancer ([place])   11/11/2015 - 11/13/2015 Hospital Admission    abdominal pain, cramping, diarrhea, weight loss, hyponatremia CT pancreatic body mass      11/12/2015 Imaging    4.7 cm mass of pancreas to L of midline, involvement of several vascular structures also possibly the distal duodenum      11/21/2015 - 11/25/2015 Hospital Admission    EUS on 5/23, hyponatremia      11/22/2015 Imaging    Diffuse mesenteric edema, large hypo enhancing pancreatic mass, abutment of celiac axis, SMA, encasement of splenic artery, occludes splenic vein, compresses porta splenic confluence      11/25/2015 Procedure    EUS Dr. Ardis Hughs, irreg mass in pancreatic body/tail. 4.2 cm, involvement of major blood vessels not appreciated due to size of mass      11/25/2015 Pathology Results    malignant cells c/w adenocarcinoma      12/04/2015 Miscellaneous    Acid Fast Smear negative      12/04/2015 Imaging    No convincing evidence of pulm mets, improvement in LLL, RUL angular nodular thickening seen on comparison CT from 2013, residual cavitation remains at the L lung base, one nodule oblique fissue on R 40mm  new      12/08/2015 Procedure    Port a cath      12/09/2015 -  Chemotherapy    Gemzar/Abraxane days 1, 8, and 15 every 28 days.      12/12/2015 Imaging    MRI abd- Limited MRI, D/C prior to completion at patient request. Large infiltrative 8.1 x 4.7 x 5.8 cm pancreatic mass centered at the junction of the pancreatic body and tail, consistent with known primary pancreatic adenocarc      12/23/2015 Treatment Plan Change    Treatment deferred x 7 days due to thrombocytopenia      01/14/2016 Imaging    CT abd/pelvis- Mass in the body/ tail of the pancreas consistent with known pancreatic carcinoma. Likely metastases to the liver demonstrating some progression since previous study.      02/26/2016 Treatment Plan Change    Day 15 deleted from treatment plan.  Chemotherapy is now days 1, 8 every 28 days.      Weight is stable.  He notes that his pain is well controlled.  He notes that his bowels are working well.  He is using MiraLax which is very effective for him.  For sleep, he is using Unisom with good effect.  We have tried Restoril and he notes that it was not very helpful.  Review of Systems  Constitutional: Negative.  Negative for chills, fever and weight loss.  HENT: Negative.   Eyes: Negative.   Respiratory: Negative.  Negative for cough and hemoptysis.   Cardiovascular: Negative.  Negative for chest pain.  Gastrointestinal: Negative.  Negative for blood in stool, constipation, diarrhea, melena, nausea and vomiting.  Genitourinary: Negative.   Musculoskeletal: Negative.   Skin: Negative.   Neurological: Negative.   Endo/Heme/Allergies: Negative.   Psychiatric/Behavioral: Negative.     Past Medical History:  Diagnosis Date  . Abdominal pain in male    "was told he has a Mass near pancreas"  . Cancer (HCC)    Skin cancer-ear, skin cancer -leeg,cancer of leg muscle- left., basal cell   . Pancreatic cancer ([place]) 12/02/2015  . Pancreatic mass april 2017  . TB  (pulmonary tuberculosis)    3 yrs ago-tx" positive TB test aslso" was told he is not contagious.treatment done for regulat TB.    Past Surgical History:  Procedure Laterality Date  . COLONOSCOPY W/ POLYPECTOMY    . EUS N/A 11/25/2015   Procedure: ESOPHAGEAL ENDOSCOPIC ULTRASOUND (EUS) RADIAL;  Surgeon: Milus Banister, MD;  Location: [facility] END[facility];  Service: Endoscopy;  Laterality: N/A;  . LEG SURGERY Left    " cancer removed from muscle"    Family History  Problem Relation Age of Onset  . Stroke Mother   . Pneumonia Father     Social History   Social History  . Marital status: Single    Spouse name: N/A  . Number of children: N/A  . Years of education: N/A   Social History Main Topics  . Smoking status: Current Every Day Smoker    Packs/day: 0.50    Years: 30.00    Types: Cigarettes  . Smokeless tobacco: Never Used  . Alcohol use No     Comment: 1-2 beers at night - pt states he can't remember the last time he drank any alcohol  . Drug use: No  . Sexual activity: No   Other Topics Concern  . None   Social History Narrative  . None     PHYSICAL EXAMINATION  ECOG PERFORMANCE STATUS: 1 - Symptomatic but completely ambulatory  There were no vitals filed for this visit.  Vitals - 1 value per visit [id number]  SYSTOLIC [id number]  DIASTOLIC 78  Pulse [id number]  Temperature 97.9  Respirations 16    GENERAL:alert, no distress, cachectic, comfortable, cooperative, smiling, unaccompanied, in chemo-recliner SKIN: skin color, texture, turgor are normal, no rashes or significant lesions HEAD: Normocephalic, No masses, lesions, tenderness or abnormalities EYES: normal, EOMI, Conjunctiva are pink and non-injected EARS: External ears normal OROPHARYNX:lips, buccal mucosa, and tongue normal and mucous membranes are moist  NECK: supple, trachea midline LYMPH:  no palpable lymphadenopathy BREAST:not examined LUNGS: clear to auscultation with decreased breath sounds bibasilar. HEART:  regular rate & rhythm without murmur, rub, or gallop. ABDOMEN:abdomen soft, non-tender and normal bowel sounds BACK: Back symmetric, no curvature. EXTREMITIES:less then 2 second capillary refill, no joint deformities, effusion, or inflammation, no skin discoloration, no cyanosis  NEURO: alert & oriented x 3 with fluent speech, no focal motor/sensory deficits, gait normal   LABORATORY DATA: CBC    Component Value Date/Time   WBC 8.3 03/25/2016 0951   RBC 3.29 (L) 03/25/2016 0951   HGB 11.5 (L) 03/25/2016 0951   HCT 33.8 (L) 03/25/2016 0951   PLT 345 03/25/2016 0951   MCV 102.7 (H) 03/25/2016 0951   MCH 35.0 (H) 03/25/2016 0951   MCHC 34.0 03/25/2016 0951   RDW 18.3 (H) 03/25/2016 0951   LYMPHSABS 1.7 03/25/2016 0951   MONOABS 1.0 03/25/2016 [id number]  EOSABS 0.2 03/25/2016 0951   BASOSABS 0.0 03/25/2016 0951      Chemistry      Component Value Date/Time   NA 134 (L) 03/11/2016 1021   K 4.5 03/11/2016 1021   CL 95 (L) 03/11/2016 1021   CO2 27 03/11/2016 1021   BUN 9 03/11/2016 1021   CREATININE 0.46 (L) 03/11/2016 1021      Component Value Date/Time   CALCIUM 8.8 (L) 03/11/2016 1021   ALKPHOS 68 03/11/2016 1021   AST 38 03/11/2016 1021   ALT 50 03/11/2016 1021   BILITOT 0.2 (L) 03/11/2016 1021        PENDING LABS:   RADIOGRAPHIC STUDIES:  No results found.   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  Began systemic chemotherapy consisting of Abraxane/Gemzar days 1, 8, 15 every 28 days on 12/09/2015.  He also has a history of TB, treated by [facility] and history of MAC.  Oncology history is updated.  Pre-treatment labs today as ordered CBC diff, CMET, CA 19-9.  I personally reviewed and went over laboratory results with  the patient.  The results are noted within this dictation.  Labs satisfy treatment parameters.    Burtis Junes, RD is following the patient.  Weight is stable.  Bowel movements are moving well with MiraLax.  No blood in stool.  He is using Unisom to help him sleep.  He notes that this is effective.  He requests a refill on Viagra which he typically gets from [place].  He is unable to get to the [place] soon to get this medication refilled.  RX is printed for Viagra 100 mg.  CT abd/pelvis is ordered and will be scheduled to be completed in ~ 3 weeks for restaging purposes.  Return in 3 weeks for follow-up and review of restaging tests.   ORDERS PLACED FOR THIS ENCOUNTER: Orders Placed This Encounter  Procedures  . CT Abdomen Pelvis W Contrast    MEDICATIONS PRESCRIBED THIS ENCOUNTER: Meds ordered this encounter  Medications  . sildenafil (VIAGRA) 100 MG tablet    Sig: Take 0.5 tablets (50 mg total) by mouth daily as needed for erectile dysfunction.    Dispense:  10 tablet    Refill:  0    Order Specific Question:   Supervising Provider    Answer:   Patrici Ranks [id number]    THERAPY PLAN:  Continue palliative treatment as outlined above and continued discussions regarding goals of care.  All questions were answered. The patient knows to call the clinic with any problems, questions or concerns. We can certainly see the patient much sooner if necessary.  Patient and plan discussed with Dr. Ancil Linsey and she is in agreement with the aforementioned.   This note is electronically signed by: Doy Mince 03/25/2016 10:51 AM

## 2016-03-25 NOTE — Patient Instructions (Signed)
[facility] Discharge Instructions for Patients Receiving Chemotherapy   Beginning January 23rd 2017 lab work for the Cancer Center will be done in the  Main lab at [place] on 1st floor. If you have a lab appointment with the Cancer Center please come in thru the  Main Entrance and check in at the main information desk   Today you received the following chemotherapy agents:  Abraxane and Gemzar.  If you develop nausea and vomiting, or diarrhea that is not controlled by your medication, call the clinic.  The clinic phone number is [phone]. Office hours are Monday-Friday 8:30am-5:00pm.  BELOW ARE SYMPTOMS THAT SHOULD BE REPORTED IMMEDIATELY:  *FEVER GREATER THAN 101.0 F  *CHILLS WITH OR WITHOUT FEVER  NAUSEA AND VOMITING THAT IS NOT CONTROLLED WITH YOUR NAUSEA MEDICATION  *UNUSUAL SHORTNESS OF BREATH  *UNUSUAL BRUISING OR BLEEDING  TENDERNESS IN MOUTH AND THROAT WITH OR WITHOUT PRESENCE OF ULCERS  *URINARY PROBLEMS  *BOWEL PROBLEMS  UNUSUAL RASH Items with * indicate a potential emergency and should be followed up as soon as possible. If you have an emergency after office hours please contact your primary care physician or go to the nearest emergency department.  Please call the clinic during office hours if you have any questions or concerns.   You may also contact the Patient Navigator at [phone] should you have any questions or need assistance in obtaining follow up care.      Resources For Cancer Patients and their Caregivers ? American Cancer Society: Can assist with transportation, wigs, general needs, runs Look Good Feel Better.        1-888-227-6333 ? Cancer Care: Provides financial assistance, online support groups, medication/co-pay assistance.  1-800-813-HOPE (4673) ? [facility] Assists [place] cancer patients and their families through emotional , educational and financial support.   336-427-4357 ? [place] DSS Where to apply for food stamps, Medicaid and utility assistance. 336-342-1394 ? RCATS: Transportation to medical appointments. 336-347-2287 ? Social Security Administration: May apply for disability if have a Stage IV cancer. [phone] 1-800-772-1213 ? [place] Aging, Disability and Transit Services: Assists with nutrition, care and transit needs. [phone]         

## 2016-03-26 LAB — CANCER ANTIGEN 19-9: CA 19-9: 300 U/mL — ABNORMAL HIGH (ref 0–35)

## 2016-03-29 ENCOUNTER — Telehealth (HOSPITAL_COMMUNITY): Payer: Self-pay | Admitting: *Deleted

## 2016-03-29 NOTE — Telephone Encounter (Signed)
-----   Message from Baird Cancer, PA-C sent at 03/27/2016 10:29 PM EDT ----- Continued improvement.  Let him know.

## 2016-04-01 ENCOUNTER — Encounter (HOSPITAL_BASED_OUTPATIENT_CLINIC_OR_DEPARTMENT_OTHER): Payer: Medicaid Other

## 2016-04-01 ENCOUNTER — Other Ambulatory Visit (HOSPITAL_COMMUNITY): Payer: Self-pay | Admitting: Oncology

## 2016-04-01 VITALS — BP 138/74 | HR 78 | Temp 98.0°F | Resp 16 | Wt 113.2 lb

## 2016-04-01 DIAGNOSIS — Z5111 Encounter for antineoplastic chemotherapy: Secondary | ICD-10-CM | POA: Diagnosis present

## 2016-04-01 DIAGNOSIS — C258 Malignant neoplasm of overlapping sites of pancreas: Secondary | ICD-10-CM

## 2016-04-01 DIAGNOSIS — C787 Secondary malignant neoplasm of liver and intrahepatic bile duct: Secondary | ICD-10-CM

## 2016-04-01 DIAGNOSIS — C257 Malignant neoplasm of other parts of pancreas: Secondary | ICD-10-CM | POA: Diagnosis not present

## 2016-04-01 DIAGNOSIS — K869 Disease of pancreas, unspecified: Secondary | ICD-10-CM | POA: Diagnosis not present

## 2016-04-01 DIAGNOSIS — C259 Malignant neoplasm of pancreas, unspecified: Secondary | ICD-10-CM

## 2016-04-01 DIAGNOSIS — R042 Hemoptysis: Secondary | ICD-10-CM

## 2016-04-01 DIAGNOSIS — G893 Neoplasm related pain (acute) (chronic): Secondary | ICD-10-CM

## 2016-04-01 DIAGNOSIS — A310 Pulmonary mycobacterial infection: Secondary | ICD-10-CM

## 2016-04-01 LAB — CBC WITH DIFFERENTIAL/PLATELET
BASOS ABS: 0 10*3/uL (ref 0.0–0.1)
BASOS PCT: 1 %
EOS ABS: 0.1 10*3/uL (ref 0.0–0.7)
EOS PCT: 2 %
HCT: 30.7 % — ABNORMAL LOW (ref 39.0–52.0)
Hemoglobin: 10.5 g/dL — ABNORMAL LOW (ref 13.0–17.0)
Lymphocytes Relative: 31 %
Lymphs Abs: 1.3 10*3/uL (ref 0.7–4.0)
MCH: 34.8 pg — ABNORMAL HIGH (ref 26.0–34.0)
MCHC: 34.2 g/dL (ref 30.0–36.0)
MCV: 101.7 fL — AB (ref 78.0–100.0)
MONO ABS: 0.3 10*3/uL (ref 0.1–1.0)
Monocytes Relative: 8 %
Neutro Abs: 2.6 10*3/uL (ref 1.7–7.7)
Neutrophils Relative %: 58 %
PLATELETS: 342 10*3/uL (ref 150–400)
RBC: 3.02 MIL/uL — ABNORMAL LOW (ref 4.22–5.81)
RDW: 16.9 % — AB (ref 11.5–15.5)
WBC: 4.4 10*3/uL (ref 4.0–10.5)

## 2016-04-01 LAB — COMPREHENSIVE METABOLIC PANEL
ALBUMIN: 3.8 g/dL (ref 3.5–5.0)
ALT: 41 U/L (ref 17–63)
ANION GAP: 6 (ref 5–15)
AST: 35 U/L (ref 15–41)
Alkaline Phosphatase: 65 U/L (ref 38–126)
BILIRUBIN TOTAL: 0.4 mg/dL (ref 0.3–1.2)
BUN: 6 mg/dL (ref 6–20)
CO2: 27 mmol/L (ref 22–32)
Calcium: 8.8 mg/dL — ABNORMAL LOW (ref 8.9–10.3)
Chloride: 99 mmol/L — ABNORMAL LOW (ref 101–111)
Creatinine, Ser: 0.46 mg/dL — ABNORMAL LOW (ref 0.61–1.24)
GFR calc non Af Amer: >60 mL/min (ref >60)
GLUCOSE: 133 mg/dL — AB (ref 65–99)
POTASSIUM: 4 mmol/L (ref 3.5–5.1)
Sodium: 132 mmol/L — ABNORMAL LOW (ref 135–145)
TOTAL PROTEIN: 7.2 g/dL (ref 6.5–8.1)

## 2016-04-01 MED ORDER — OXYCODONE HCL ER 15 MG PO T12A: 15.0000 mg | EXTENDED_RELEASE_TABLET | Freq: Two times a day (BID) | ORAL | 0 refills | Status: DC

## 2016-04-01 MED ORDER — SODIUM CHLORIDE 0.9 % IV SOLN
Freq: Once | INTRAVENOUS | Status: AC
  Administered 2016-04-01: 13:00:00 via INTRAVENOUS

## 2016-04-01 MED ORDER — OXYCODONE HCL 10 MG PO TABS: 10.0000 mg | ORAL_TABLET | ORAL | 0 refills | Status: DC | PRN

## 2016-04-01 MED ORDER — HEPARIN SOD (PORK) LOCK FLUSH 100 UNIT/ML IV SOLN
500.0000 [IU] | Freq: Once | INTRAVENOUS | Status: AC | PRN
  Administered 2016-04-01: 500 [IU]

## 2016-04-01 MED ORDER — PACLITAXEL PROTEIN-BOUND CHEMO INJECTION 100 MG
125.0000 mg/m2 | Freq: Once | INTRAVENOUS | Status: AC
  Administered 2016-04-01: 200 mg via INTRAVENOUS
  Filled 2016-04-01: qty 40

## 2016-04-01 MED ORDER — GEMCITABINE HCL CHEMO INJECTION 1 GM/26.3ML
1000.0000 mg/m2 | Freq: Once | INTRAVENOUS | Status: AC
  Administered 2016-04-01: 1634 mg via INTRAVENOUS
  Filled 2016-04-01: qty 16.7

## 2016-04-01 MED ORDER — SODIUM CHLORIDE 0.9% FLUSH: 10.0000 mL | INTRAVENOUS | Status: DC | PRN

## 2016-04-01 MED ORDER — SODIUM CHLORIDE 0.9 % IV SOLN
Freq: Once | INTRAVENOUS | Status: AC
  Administered 2016-04-01: 13:00:00 via INTRAVENOUS
  Filled 2016-04-01: qty 4

## 2016-04-01 NOTE — Patient Instructions (Signed)
[facility] Discharge Instructions for Patients Receiving Chemotherapy   Beginning January 23rd 2017 lab work for the [facility] will be done in the  Main lab at [facility] on 1st floor. If you have a lab appointment with the [facility] please come in thru the  Main Entrance and check in at the main information desk   Today you received the following chemotherapy agents: abraxane and gemzar.     If you develop nausea and vomiting, or diarrhea that is not controlled by your medication, call the clinic.  The clinic phone number is [phone]. Office hours are Monday-Friday 8:30am-5:00pm.  BELOW ARE SYMPTOMS THAT SHOULD BE REPORTED IMMEDIATELY:  *FEVER GREATER THAN 101.0 F  *CHILLS WITH OR WITHOUT FEVER  NAUSEA AND VOMITING THAT IS NOT CONTROLLED WITH YOUR NAUSEA MEDICATION  *UNUSUAL SHORTNESS OF BREATH  *UNUSUAL BRUISING OR BLEEDING  TENDERNESS IN MOUTH AND THROAT WITH OR WITHOUT PRESENCE OF ULCERS  *URINARY PROBLEMS  *BOWEL PROBLEMS  UNUSUAL RASH Items with * indicate a potential emergency and should be followed up as soon as possible. If you have an emergency after office hours please contact your primary care physician or go to the nearest emergency department.  Please call the clinic during office hours if you have any questions or concerns.   You may also contact the Patient Navigator at [phone] should you have any questions or need assistance in obtaining follow up care.      Resources For Cancer Patients and their Caregivers ? American Cancer Society: Can assist with transportation, wigs, general needs, runs Look Good Feel Better.        858-032-5400 ? Cancer Care: Provides financial assistance, online support groups, medication/co-pay assistance.  1-800-813-HOPE 480-115-6388) ? [facility] Assists [place] cancer patients and their families through emotional , educational and financial support.   336-598-3578 ? [place] DSS Where to apply for food stamps, Medicaid and utility assistance. 6308885282 ? RCATS: Transportation to medical appointments. 906-711-3395 ? Social Security Administration: May apply for disability if have a Stage IV cancer. 443-261-1609 [phone] ? [employer], Disability and Transit Services: Assists with nutrition, care and transit needs. [phone]

## 2016-04-01 NOTE — Progress Notes (Signed)
Patient c/o bloody sputum.  MD and PA aware.  Patient tolerated infusion well.  VSS.  Patient ambulatory and stable upon discharge.

## 2016-04-02 LAB — CANCER ANTIGEN 19-9: CA 19 9: 228 U/mL — AB (ref 0–35)

## 2016-04-09 ENCOUNTER — Other Ambulatory Visit (HOSPITAL_COMMUNITY): Payer: Self-pay

## 2016-04-09 ENCOUNTER — Telehealth (HOSPITAL_COMMUNITY): Payer: Self-pay | Admitting: *Deleted

## 2016-04-09 ENCOUNTER — Ambulatory Visit (HOSPITAL_COMMUNITY)
Admission: RE | Admit: 2016-04-09 | Discharge: 2016-04-09 | Disposition: A | Discharge status: Home / Self Care | Payer: Medicaid Other | Source: Ambulatory Visit | Attending: Oncology | Admitting: Oncology

## 2016-04-09 ENCOUNTER — Encounter (HOSPITAL_COMMUNITY): Payer: Self-pay | Admitting: Emergency Medicine

## 2016-04-09 ENCOUNTER — Other Ambulatory Visit (HOSPITAL_COMMUNITY): Payer: Self-pay | Admitting: Emergency Medicine

## 2016-04-09 DIAGNOSIS — R042 Hemoptysis: Secondary | ICD-10-CM | POA: Diagnosis present

## 2016-04-09 DIAGNOSIS — A310 Pulmonary mycobacterial infection: Secondary | ICD-10-CM | POA: Insufficient documentation

## 2016-04-09 DIAGNOSIS — C251 Malignant neoplasm of body of pancreas: Secondary | ICD-10-CM

## 2016-04-09 DIAGNOSIS — I700 Atherosclerosis of aorta: Secondary | ICD-10-CM | POA: Diagnosis not present

## 2016-04-09 MED ORDER — PANCRELIPASE (LIP-PROT-AMYL) 36000-114000 UNITS PO CPEP: 36000.0000 [IU] | ORAL_CAPSULE | Freq: Three times a day (TID) | ORAL | 3 refills | Status: DC

## 2016-04-09 NOTE — Progress Notes (Signed)
Creon refilled.

## 2016-04-13 ENCOUNTER — Telehealth (HOSPITAL_COMMUNITY): Payer: Self-pay | Admitting: *Deleted

## 2016-04-13 ENCOUNTER — Other Ambulatory Visit (HOSPITAL_COMMUNITY): Payer: Self-pay | Admitting: Oncology

## 2016-04-13 DIAGNOSIS — G893 Neoplasm related pain (acute) (chronic): Secondary | ICD-10-CM

## 2016-04-13 MED ORDER — OXYCODONE HCL 10 MG PO TABS: 10.0000 mg | ORAL_TABLET | ORAL | 0 refills | Status: DC | PRN

## 2016-04-13 NOTE — Telephone Encounter (Signed)
printed

## 2016-04-15 NOTE — Assessment & Plan Note (Addendum)
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  Began systemic chemotherapy consisting of Abraxane/Gemzar days 1, 8, 15 every 28 days on 12/09/2015.  He also has a history of TB, treated by [facility] and history of MAC.  Oncology history is updated.  Pre-treatment labs as ordered CBC diff, CMET, CA 19-9.  I personally reviewed and went over laboratory results with the patient.  The results are noted within this dictation.   There must have been a scheduling error as he was due for treatment yesterday.  He is not scheduled for treatment at this time.  He cannot have treatment today due to scheduling conflict he notes.  He is willing to start cycle #6 next week.  His hemoptysis has resolved.  Burtis Junes, RD is following the patient.  Weight is stable.  Bowel movements are moving well with MiraLax.  No blood in stool.  He is using Unisom to help him sleep.  He notes that this is effective.  CT abd/pelvis is ordered and will be scheduled to be completed in ~ 3 weeks for restaging purposes.  He has intolerance to oral contrast resulting in severe vomiting.  As a result, we will ask him to take the clear oral oral contrast that will require him to report 2 hours prior to his CT imaging appointment.  He is advised on the importance of oral contrast to ascertain adequate images.  Return in 3-4 weeks for follow-up and review of restaging tests.

## 2016-04-15 NOTE — Progress Notes (Signed)
PROVIDER NOT IN SYSTEM No address on file  Malignant neoplasm of body of pancreas ([place])  CURRENT THERAPY: Abraxane/Gemzar days 1, and 8 every 21 days beginning on 12/09/2015  INTERVAL HISTORY: Nicholas [name] 66 y.o. 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  Began systemic chemotherapy consisting of Abraxane/Gemzar days 1, 8, 15 every 28 days on 12/09/2015.  He also has a history of TB, treated by [facility] and history of MAC.    Pancreatic cancer ([place])   11/11/2015 - 11/13/2015 Hospital Admission    abdominal pain, cramping, diarrhea, weight loss, hyponatremia CT pancreatic body mass      11/12/2015 Imaging    4.7 cm mass of pancreas to L of midline, involvement of several vascular structures also possibly the distal duodenum      11/21/2015 - 11/25/2015 Hospital Admission    EUS on 5/23, hyponatremia      11/22/2015 Imaging    Diffuse mesenteric edema, large hypo enhancing pancreatic mass, abutment of celiac axis, SMA, encasement of splenic artery, occludes splenic vein, compresses porta splenic confluence      11/25/2015 Procedure    EUS Dr. Ardis Hughs, irreg mass in pancreatic body/tail. 4.2 cm, involvement of major blood vessels not appreciated due to size of mass      11/25/2015 Pathology Results    malignant cells c/w adenocarcinoma      12/04/2015 Miscellaneous    Acid Fast Smear negative      12/04/2015 Imaging    No convincing evidence of pulm mets, improvement in LLL, RUL angular nodular thickening seen on comparison CT from 2013, residual cavitation remains at the L lung base, one nodule oblique fissue on R 21mm new      12/08/2015 Procedure    Port a cath      12/09/2015 -  Chemotherapy    Gemzar/Abraxane days 1, 8, and 15 every 28 days.      12/12/2015  Imaging    MRI abd- Limited MRI, D/C prior to completion at patient request. Large infiltrative 8.1 x 4.7 x 5.8 cm pancreatic mass centered at the junction of the pancreatic body and tail, consistent with known primary pancreatic adenocarc      12/23/2015 Treatment Plan Change    Treatment deferred x 7 days due to thrombocytopenia      01/14/2016 Imaging    CT abd/pelvis- Mass in the body/ tail of the pancreas consistent with known pancreatic carcinoma. Likely metastases to the liver demonstrating some progression since previous study.      02/26/2016 Treatment Plan Change    Day 15 deleted from treatment plan.  Chemotherapy is now days 1, 8 every 28 days.      He is doing well.  His hemoptysis has resolved.  He notes that his bowels are moving well.  He is using MiraLax PRN.  He notes that his pain is well controlled.  Weight is very stable.  He does note some fatigue and dysgeusia.  Both are improved this week, but he reports that they become problematic following treatment.   Review of Systems  Constitutional: Positive for malaise/fatigue. Negative for chills, fever and weight loss.  HENT: Negative.   Eyes: Negative.   Respiratory: Negative.  Negative for cough, hemoptysis and sputum production.   Cardiovascular: Negative.  Negative for  chest pain.  Gastrointestinal: Negative.  Negative for abdominal pain, blood in stool, constipation, diarrhea, nausea and vomiting.  Genitourinary: Negative.   Musculoskeletal: Negative.   Skin: Negative.  Negative for rash.  Neurological: Negative.  Negative for weakness and headaches.  Endo/Heme/Allergies: Negative.   Psychiatric/Behavioral: Negative.  The patient does not have insomnia.     Past Medical History:  Diagnosis Date  . Abdominal pain in male    "was told he has a Mass near pancreas"  . Cancer (HCC)    Skin cancer-ear, skin cancer -leeg,cancer of leg muscle- left., basal cell   . Pancreatic cancer ([place]) 12/02/2015  . Pancreatic  mass april 2017  . TB (pulmonary tuberculosis)    3 yrs ago-tx" positive TB test aslso" was told he is not contagious.treatment done for regulat TB.    Past Surgical History:  Procedure Laterality Date  . COLONOSCOPY W/ POLYPECTOMY    . EUS N/A 11/25/2015   Procedure: ESOPHAGEAL ENDOSCOPIC ULTRASOUND (EUS) RADIAL;  Surgeon: Milus Banister, MD;  Location: [facility];  Service: Endoscopy;  Laterality: N/A;  . LEG SURGERY Left    " cancer removed from muscle"    Family History  Problem Relation Age of Onset  . Stroke Mother   . Pneumonia Father     Social History   Social History  . Marital status: Single    Spouse name: N/A  . Number of children: N/A  . Years of education: N/A   Social History Main Topics  . Smoking status: Current Every Day Smoker    Packs/day: 0.50    Years: 30.00    Types: Cigarettes  . Smokeless tobacco: Never Used  . Alcohol use No     Comment: 1-2 beers at night - pt states he can't remember the last time he drank any alcohol  . Drug use: No  . Sexual activity: No   Other Topics Concern  . None   Social History Narrative  . None     PHYSICAL EXAMINATION  ECOG PERFORMANCE STATUS: 1 - Symptomatic but completely ambulatory  Vitals:   04/16/16 0844  BP: (!) 149/75  Pulse: (!) 101  Resp: 18  Temp: 97.9 F (36.6 C)     GENERAL:alert, no distress, cachectic, comfortable, cooperative, smiling, unaccompanied. SKIN: skin color, texture, turgor are normal, no rashes or significant lesions HEAD: Normocephalic, No masses, lesions, tenderness or abnormalities EYES: normal, EOMI, Conjunctiva are pink and non-injected EARS: External ears normal OROPHARYNX:lips, buccal mucosa, and tongue normal and mucous membranes are moist  NECK: supple, trachea midline LYMPH:  no palpable lymphadenopathy BREAST:not examined LUNGS: clear to auscultation with decreased breath sounds bibasilar. HEART: regular rate & rhythm without murmur, rub, or  gallop. ABDOMEN:abdomen soft, non-tender and normal bowel sounds BACK: Back symmetric, no curvature. EXTREMITIES:less then 2 second capillary refill, no joint deformities, effusion, or inflammation, no skin discoloration, no cyanosis  NEURO: alert & oriented x 3 with fluent speech, no focal motor/sensory deficits, gait normal   LABORATORY DATA: CBC    Component Value Date/Time   WBC 4.4 04/01/2016 1058   RBC 3.02 (L) 04/01/2016 1058   HGB 10.5 (L) 04/01/2016 1058   HCT 30.7 (L) 04/01/2016 1058   PLT 342 04/01/2016 1058   MCV 101.7 (H) 04/01/2016 1058   MCH 34.8 (H) 04/01/2016 1058   MCHC 34.2 04/01/2016 1058   RDW 16.9 (H) 04/01/2016 1058   LYMPHSABS 1.3 04/01/2016 1058   MONOABS 0.3 04/01/2016 1058   EOSABS 0.1 04/01/2016 1058  BASOSABS 0.0 04/01/2016 1058      Chemistry      Component Value Date/Time   NA 132 (L) 04/01/2016 1058   K 4.0 04/01/2016 1058   CL 99 (L) 04/01/2016 1058   CO2 27 04/01/2016 1058   BUN 6 04/01/2016 1058   CREATININE 0.46 (L) 04/01/2016 1058      Component Value Date/Time   CALCIUM 8.8 (L) 04/01/2016 1058   ALKPHOS 65 04/01/2016 1058   AST 35 04/01/2016 1058   ALT 41 04/01/2016 1058   BILITOT 0.4 04/01/2016 1058        PENDING LABS:   RADIOGRAPHIC STUDIES:  Dg Chest 1 View  Result Date: 04/09/2016 CLINICAL DATA:  Patient is currently asymptomatic. Surveillance exam for mA I infection. History of pancreatic malignancy. EXAM: CHEST 1 VIEW COMPARISON:  Chest x-ray of out with correction of December 25, 2014 and CT scan of the chest of December 04, 2015. FINDINGS: The lungs are hyperinflated. There is chronic parenchymal abnormality in the left upper lobe at the site of the patient's previous infection. The density here is less conspicuous than in June of 2017 but is fairly stable as compared to the December 04, 2015 CT scan. There is subtle increased density peripherally in the left mid lung which is in part due to overlap with the tip of the scapula.  There is no left pleural effusion or pneumothorax. The right lung is mildly hypoinflated and clear. There is are old fractures of the posterior lateral aspects of the right eighth and ninth ribs. The heart is normal in size. The pulmonary vascularity is normal. There is calcification in the wall of the aortic arch. The Port-A-Cath tip projects over the midportion of the SVC. IMPRESSION: Chronic abnormality in the left upper lobe improved since June of 2016 but fairly stable since June of 2017. Density peripherally in the left mid lung that may be due to overlap of normal structures. A PA and lateral chest x-ray is recommended to evaluate this area more completely. Aortic atherosclerosis. Electronically Signed   By: David  Martinique M.D.   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  Began systemic chemotherapy consisting of Abraxane/Gemzar days 1, 8, 15 every 28 days on 12/09/2015.  He also has a history of TB, treated by [facility] and history of MAC.  Oncology history is updated.  Pre-treatment labs as ordered CBC diff, CMET, CA 19-9.  I personally reviewed and went over laboratory results with the patient.  The results are noted within this dictation.   There must have been a scheduling error as he was due for treatment yesterday.  He is not scheduled for treatment at this time.  He cannot have treatment today due to scheduling conflict he notes.  He is willing to start cycle #6 next week.  His hemoptysis has resolved.  Nicholas [name], RD is following the patient.  Weight is stable.  Bowel movements are moving well with MiraLax.  No blood in stool.  He is using Unisom to help him sleep.  He notes that this is effective.  CT  abd/pelvis is ordered and will be scheduled to be completed in ~ 3 weeks for restaging purposes.  He has intolerance to oral contrast resulting in severe  vomiting.  As a result, we will ask him to take the clear oral oral contrast that will require him to report 2 hours prior to his CT imaging appointment.  He is advised on the importance of oral contrast to ascertain adequate images.  Return in 3-4 weeks for follow-up and review of restaging tests.   ORDERS PLACED FOR THIS ENCOUNTER: No orders of the defined types were placed in this encounter.   MEDICATIONS PRESCRIBED THIS ENCOUNTER: No orders of the defined types were placed in this encounter.   THERAPY PLAN:  Continue palliative treatment as outlined above and continued discussions regarding goals of care.  All questions were answered. The patient knows to call the clinic with any problems, questions or concerns. We can certainly see the patient much sooner if necessary.  Patient and plan discussed with Dr. Ancil Linsey and she is in agreement with the aforementioned.   This note is electronically signed by: Robynn Pane, PA-C 04/16/2016 9:07 AM

## 2016-04-16 ENCOUNTER — Encounter: Payer: Self-pay | Admitting: Dietician

## 2016-04-16 ENCOUNTER — Encounter (HOSPITAL_COMMUNITY): Payer: Self-pay | Admitting: Oncology

## 2016-04-16 ENCOUNTER — Encounter (HOSPITAL_COMMUNITY): Payer: Medicaid Other | Attending: Hematology & Oncology | Admitting: Oncology

## 2016-04-16 DIAGNOSIS — C251 Malignant neoplasm of body of pancreas: Secondary | ICD-10-CM | POA: Diagnosis not present

## 2016-04-16 DIAGNOSIS — R599 Enlarged lymph nodes, unspecified: Secondary | ICD-10-CM | POA: Diagnosis not present

## 2016-04-16 DIAGNOSIS — R143 Flatulence: Secondary | ICD-10-CM | POA: Insufficient documentation

## 2016-04-16 DIAGNOSIS — K7689 Other specified diseases of liver: Secondary | ICD-10-CM | POA: Insufficient documentation

## 2016-04-16 DIAGNOSIS — C787 Secondary malignant neoplasm of liver and intrahepatic bile duct: Secondary | ICD-10-CM | POA: Diagnosis not present

## 2016-04-16 DIAGNOSIS — N289 Disorder of kidney and ureter, unspecified: Secondary | ICD-10-CM

## 2016-04-16 DIAGNOSIS — K5900 Constipation, unspecified: Secondary | ICD-10-CM | POA: Insufficient documentation

## 2016-04-16 DIAGNOSIS — E876 Hypokalemia: Secondary | ICD-10-CM | POA: Insufficient documentation

## 2016-04-16 DIAGNOSIS — C258 Malignant neoplasm of overlapping sites of pancreas: Secondary | ICD-10-CM | POA: Insufficient documentation

## 2016-04-16 DIAGNOSIS — Z8611 Personal history of tuberculosis: Secondary | ICD-10-CM | POA: Insufficient documentation

## 2016-04-16 DIAGNOSIS — K869 Disease of pancreas, unspecified: Secondary | ICD-10-CM | POA: Insufficient documentation

## 2016-04-16 NOTE — Patient Instructions (Addendum)
Nicholas [name] at [facility] Discharge Instructions  RECOMMENDATIONS MADE BY THE CONSULTANT AND ANY TEST RESULTS WILL BE SENT TO YOUR REFERRING PHYSICIAN.  You saw Kirby Crigler, PA-C today. Chemo next week. Labs next week. CT scans in 3-4 weeks. Follow up with Dr. Whitney Muse after scans  Thank you for choosing [place] at [facility] to provide your oncology and hematology care.  To afford each patient quality time with our provider, please arrive at least 15 minutes before your scheduled appointment time.   Beginning January 23rd 2017 lab work for the [facility] will be done in the  Main lab at [facility] on 1st floor. If you have a lab appointment with the [place] please come in thru the  Main Entrance and check in at the main information desk  You need to re-schedule your appointment should you arrive 10 or more minutes late.  We strive to give you quality time with our providers, and arriving late affects you and other patients whose appointments are after yours.  Also, if you no show three or more times for appointments you may be dismissed from the clinic at the providers discretion.     Again, thank you for choosing [facility].  Our hope is that these requests will decrease the amount of time that you wait before being seen by our physicians.       _____________________________________________________________  Should you have questions after your visit to [facility], please contact our office at [phone] between the hours of 8:30 a.m. and 4:30 p.m.  Voicemails left after 4:30 p.m. will not be returned until the following business day.  For prescription refill requests, have your pharmacy contact our office.         Resources For Cancer Patients and their Caregivers ? American Cancer Society: Can assist with transportation, wigs, general needs, runs Look Good Feel Better.         720-882-5295 ? Cancer Care: Provides financial assistance, online support groups, medication/co-pay assistance.  1-800-813-HOPE (857) 790-8101) ? [place] Assists [place] cancer patients and their families through emotional , educational and financial support.  501 789 2065 ? [place] DSS Where to apply for food stamps, Medicaid and utility assistance. 470-083-4036 ? RCATS: Transportation to medical appointments. 504-285-9887 ? Social Security Administration: May apply for disability if have a Stage IV cancer. 757-496-5622 760-040-9891 ? [employer], Disability and Transit Services: Assists with nutrition, care and transit needs. [place] Support Programs: @10RELATIVEDAYS @ > Cancer Support Group  2nd Tuesday of the month 1pm-2pm, Journey Room  > Creative Journey  3rd Tuesday of the month 1130am-1pm, Journey Room  > Look Good Feel Better  1st Wednesday of the month 10am-12 noon, Journey Room (Call [place] to register [phone])

## 2016-04-16 NOTE — Progress Notes (Signed)
Follow up with pancreatic cancer pt  Contacted Pt by visiting prior to office visit   Wt Readings from Last 10 Encounters:  04/16/16 113 lb (51.3 kg)  04/01/16 113 lb 3.2 oz (51.3 kg)  03/25/16 113 lb (51.3 kg)  03/11/16 113 lb 12.8 oz (51.6 kg)  03/04/16 112 lb 12.8 oz (51.2 kg)  02/19/16 113 lb 3.2 oz (51.3 kg)  02/12/16 112 lb 1.6 oz (50.8 kg)  01/28/16 114 lb 9.6 oz (52 kg)  01/21/16 111 lb 9.6 oz (50.6 kg)  01/14/16 112 lb 3.2 oz (50.9 kg)   Patient weight has remained stable at 113 lbs.   Patient reports oral intake as very good. He denies any n/v/c/d. Though, his appetite has has always been good, He says since he stopped chemotherapy it has improved even more. He has cut back on how many Ensures he drinks daily because of his increase in appetite. He is still taking the creon with every meal.   He says he stopped taking miralax because he no longer needs it; he is trying to reduce how much pain medication he take.   He has been taking a dandelion herbal supplement.  He is sleeping better. He still has a lower energy level.   Has no real barriers to good nutrition at this time. Wt is stable. Continue to monitor  Left additional Ensure Supplements for patient.   Burtis Junes RD, LDN, [place] [facility] Pager: [phone] 04/16/2016 8:52 AM

## 2016-04-22 ENCOUNTER — Other Ambulatory Visit (HOSPITAL_COMMUNITY): Payer: Self-pay | Admitting: Oncology

## 2016-04-22 ENCOUNTER — Encounter (HOSPITAL_BASED_OUTPATIENT_CLINIC_OR_DEPARTMENT_OTHER): Payer: Medicaid Other

## 2016-04-22 VITALS — BP 144/77 | HR 87 | Temp 98.7°F | Resp 16 | Wt 113.4 lb

## 2016-04-22 DIAGNOSIS — Z8611 Personal history of tuberculosis: Secondary | ICD-10-CM | POA: Diagnosis present

## 2016-04-22 DIAGNOSIS — C787 Secondary malignant neoplasm of liver and intrahepatic bile duct: Secondary | ICD-10-CM

## 2016-04-22 DIAGNOSIS — C258 Malignant neoplasm of overlapping sites of pancreas: Secondary | ICD-10-CM | POA: Diagnosis present

## 2016-04-22 DIAGNOSIS — C257 Malignant neoplasm of other parts of pancreas: Secondary | ICD-10-CM

## 2016-04-22 DIAGNOSIS — C251 Malignant neoplasm of body of pancreas: Secondary | ICD-10-CM | POA: Diagnosis not present

## 2016-04-22 DIAGNOSIS — R143 Flatulence: Secondary | ICD-10-CM | POA: Diagnosis present

## 2016-04-22 DIAGNOSIS — E876 Hypokalemia: Secondary | ICD-10-CM | POA: Diagnosis present

## 2016-04-22 DIAGNOSIS — K869 Disease of pancreas, unspecified: Secondary | ICD-10-CM | POA: Diagnosis not present

## 2016-04-22 DIAGNOSIS — Z5111 Encounter for antineoplastic chemotherapy: Secondary | ICD-10-CM

## 2016-04-22 DIAGNOSIS — K5900 Constipation, unspecified: Secondary | ICD-10-CM | POA: Diagnosis present

## 2016-04-22 DIAGNOSIS — G893 Neoplasm related pain (acute) (chronic): Secondary | ICD-10-CM

## 2016-04-22 DIAGNOSIS — C259 Malignant neoplasm of pancreas, unspecified: Secondary | ICD-10-CM

## 2016-04-22 DIAGNOSIS — K7689 Other specified diseases of liver: Secondary | ICD-10-CM | POA: Diagnosis present

## 2016-04-22 LAB — CBC WITH DIFFERENTIAL/PLATELET
BASOS ABS: 0 10*3/uL (ref 0.0–0.1)
Basophils Relative: 0 %
Eosinophils Absolute: 0.1 10*3/uL (ref 0.0–0.7)
Eosinophils Relative: 1 %
HEMATOCRIT: 36.9 % — AB (ref 39.0–52.0)
Hemoglobin: 12.6 g/dL — ABNORMAL LOW (ref 13.0–17.0)
LYMPHS PCT: 13 %
Lymphs Abs: 1.5 10*3/uL (ref 0.7–4.0)
MCH: 35.3 pg — ABNORMAL HIGH (ref 26.0–34.0)
MCHC: 34.1 g/dL (ref 30.0–36.0)
MCV: 103.4 fL — AB (ref 78.0–100.0)
Monocytes Absolute: 1.1 10*3/uL — ABNORMAL HIGH (ref 0.1–1.0)
Monocytes Relative: 10 %
NEUTROS ABS: 8.5 10*3/uL — AB (ref 1.7–7.7)
NEUTROS PCT: 76 %
PLATELETS: 436 10*3/uL — AB (ref 150–400)
RBC: 3.57 MIL/uL — AB (ref 4.22–5.81)
RDW: 16.1 % — ABNORMAL HIGH (ref 11.5–15.5)
WBC: 11.2 10*3/uL — AB (ref 4.0–10.5)

## 2016-04-22 LAB — COMPREHENSIVE METABOLIC PANEL
ALT: 20 U/L (ref 17–63)
AST: 25 U/L (ref 15–41)
Albumin: 3.9 g/dL (ref 3.5–5.0)
Alkaline Phosphatase: 86 U/L (ref 38–126)
Anion gap: 7 (ref 5–15)
BILIRUBIN TOTAL: 0.4 mg/dL (ref 0.3–1.2)
BUN: 9 mg/dL (ref 6–20)
CHLORIDE: 95 mmol/L — AB (ref 101–111)
CO2: 28 mmol/L (ref 22–32)
CREATININE: 0.56 mg/dL — AB (ref 0.61–1.24)
Calcium: 9.1 mg/dL (ref 8.9–10.3)
GFR calc Af Amer: >60 mL/min (ref >60)
Glucose, Bld: 196 mg/dL — ABNORMAL HIGH (ref 65–99)
Potassium: 3.9 mmol/L (ref 3.5–5.1)
Sodium: 130 mmol/L — ABNORMAL LOW (ref 135–145)
Total Protein: 7.3 g/dL (ref 6.5–8.1)

## 2016-04-22 MED ORDER — OXYCODONE HCL 10 MG PO TABS: 10.0000 mg | ORAL_TABLET | ORAL | 0 refills | Status: DC | PRN

## 2016-04-22 MED ORDER — OXYCODONE HCL ER 15 MG PO T12A: 15.0000 mg | EXTENDED_RELEASE_TABLET | Freq: Two times a day (BID) | ORAL | 0 refills | Status: DC

## 2016-04-22 MED ORDER — SODIUM CHLORIDE 0.9 % IV SOLN
1000.0000 mg/m2 | Freq: Once | INTRAVENOUS | Status: AC
  Administered 2016-04-22: 1634 mg via INTRAVENOUS
  Filled 2016-04-22: qty 42.98

## 2016-04-22 MED ORDER — SODIUM CHLORIDE 0.9 % IV SOLN
Freq: Once | INTRAVENOUS | Status: AC
  Administered 2016-04-22: 12:00:00 via INTRAVENOUS

## 2016-04-22 MED ORDER — ONDANSETRON 8 MG PO TBDP
8.0000 mg | ORAL_TABLET | Freq: Once | ORAL | Status: AC
  Administered 2016-04-22: 8 mg via ORAL

## 2016-04-22 MED ORDER — PACLITAXEL PROTEIN-BOUND CHEMO INJECTION 100 MG
125.0000 mg/m2 | Freq: Once | INTRAVENOUS | Status: AC
  Administered 2016-04-22: 200 mg via INTRAVENOUS
  Filled 2016-04-22: qty 40

## 2016-04-22 MED ORDER — SODIUM CHLORIDE 0.9% FLUSH: 10.0000 mL | INTRAVENOUS | Status: DC | PRN

## 2016-04-22 MED ORDER — ONDANSETRON 8 MG PO TBDP
ORAL_TABLET | ORAL | Status: AC
  Filled 2016-04-22: qty 1

## 2016-04-22 MED ORDER — HEPARIN SOD (PORK) LOCK FLUSH 100 UNIT/ML IV SOLN
500.0000 [IU] | Freq: Once | INTRAVENOUS | Status: AC | PRN
  Administered 2016-04-22: 500 [IU]
  Filled 2016-04-22: qty 5

## 2016-04-22 MED ORDER — SODIUM CHLORIDE 0.9 % IV SOLN: Freq: Once | INTRAVENOUS | Status: DC

## 2016-04-22 NOTE — Progress Notes (Signed)
Patient tolerated infusion well.  VSS.  Patient received refill for both pain medications today, understands that they are early and that he can't fill them until the date is correct.  He verbalized understanding, but wanted them today to help prevent recurring trips to the cancer center which is difficult for him.  Patient was stable and ambulatory upon discharge from the clinic.

## 2016-04-22 NOTE — Patient Instructions (Signed)
[facility] Discharge Instructions for Patients Receiving Chemotherapy   Beginning January 23rd 2017 lab work for the [facility] will be done in the  Main lab at [facility] on 1st floor. If you have a lab appointment with the [place] please come in thru the  Main Entrance and check in at the main information desk   Today you received the following chemotherapy agents: Gemzar and abraxane.     If you develop nausea and vomiting, or diarrhea that is not controlled by your medication, call the clinic.  The clinic phone number is [phone]. Office hours are Monday-Friday 8:30am-5:00pm.  BELOW ARE SYMPTOMS THAT SHOULD BE REPORTED IMMEDIATELY:  *FEVER GREATER THAN 101.0 F  *CHILLS WITH OR WITHOUT FEVER  NAUSEA AND VOMITING THAT IS NOT CONTROLLED WITH YOUR NAUSEA MEDICATION  *UNUSUAL SHORTNESS OF BREATH  *UNUSUAL BRUISING OR BLEEDING  TENDERNESS IN MOUTH AND THROAT WITH OR WITHOUT PRESENCE OF ULCERS  *URINARY PROBLEMS  *BOWEL PROBLEMS  UNUSUAL RASH Items with * indicate a potential emergency and should be followed up as soon as possible. If you have an emergency after office hours please contact your primary care physician or go to the nearest emergency department.  Please call the clinic during office hours if you have any questions or concerns.   You may also contact the Patient Navigator at [phone] should you have any questions or need assistance in obtaining follow up care.      Resources For Cancer Patients and their Caregivers ? American Cancer Society: Can assist with transportation, wigs, general needs, runs Look Good Feel Better.        ([phone] ? Cancer Care: Provides financial assistance, online support groups, medication/co-pay assistance.  1-800-813-HOPE 805-792-0373) ? [place] Assists [place] cancer patients and their families through emotional , educational and financial support.   (808) 883-6490 ? [place] DSS Where to apply for food stamps, Medicaid and utility assistance. (760)471-2884 ? RCATS: Transportation to medical appointments. 681-390-5284 ? Social Security Administration: May apply for disability if have a Stage IV cancer. (860)881-0299 (856)780-7885 ? [employer], Disability and Transit Services: Assists with nutrition, care and transit needs. [phone]

## 2016-04-23 LAB — CANCER ANTIGEN 19-9: CA 19-9: 149 U/mL — ABNORMAL HIGH (ref 0–35)

## 2016-04-28 ENCOUNTER — Other Ambulatory Visit (HOSPITAL_COMMUNITY): Payer: Self-pay | Admitting: Oncology

## 2016-04-28 ENCOUNTER — Telehealth (HOSPITAL_COMMUNITY): Payer: Self-pay | Admitting: *Deleted

## 2016-04-28 DIAGNOSIS — G893 Neoplasm related pain (acute) (chronic): Secondary | ICD-10-CM

## 2016-04-28 MED ORDER — OXYCODONE HCL 10 MG PO TABS: 10.0000 mg | ORAL_TABLET | ORAL | 0 refills | Status: DC | PRN

## 2016-04-28 MED ORDER — OXYCODONE HCL ER 15 MG PO T12A: 15.0000 mg | EXTENDED_RELEASE_TABLET | Freq: Three times a day (TID) | ORAL | 0 refills | Status: DC

## 2016-04-28 NOTE — Telephone Encounter (Signed)
And yes, he is early.  Probably cannot be filled until tomorrow.  KEFALAS,THOMAS, PA-C 04/28/2016 5:03 PM

## 2016-04-28 NOTE — Telephone Encounter (Signed)
It is written for every 4 hours AS NEEDED.  If he is taking it EVERY 4 hours, then we need to adjust his long-acting pain medication.  We will not continue to provide #90 of Oxycodone every 2 weeks.  #90 is to be a 1 month supply.  New Rx is printed for increased dose of long-acting pain medication and refill on Oxycodone.  Oxycodone is to last 1 month.  Isley Zinni, PA-C 04/28/2016 4:55 PM

## 2016-04-29 ENCOUNTER — Encounter (HOSPITAL_BASED_OUTPATIENT_CLINIC_OR_DEPARTMENT_OTHER): Payer: Medicaid Other

## 2016-04-29 VITALS — BP 116/65 | HR 80 | Temp 97.8°F | Resp 16 | Wt 115.8 lb

## 2016-04-29 DIAGNOSIS — K869 Disease of pancreas, unspecified: Secondary | ICD-10-CM | POA: Diagnosis not present

## 2016-04-29 DIAGNOSIS — C787 Secondary malignant neoplasm of liver and intrahepatic bile duct: Secondary | ICD-10-CM

## 2016-04-29 DIAGNOSIS — C251 Malignant neoplasm of body of pancreas: Secondary | ICD-10-CM

## 2016-04-29 DIAGNOSIS — C258 Malignant neoplasm of overlapping sites of pancreas: Secondary | ICD-10-CM

## 2016-04-29 DIAGNOSIS — C259 Malignant neoplasm of pancreas, unspecified: Secondary | ICD-10-CM

## 2016-04-29 DIAGNOSIS — Z5111 Encounter for antineoplastic chemotherapy: Secondary | ICD-10-CM | POA: Diagnosis not present

## 2016-04-29 LAB — CBC WITH DIFFERENTIAL/PLATELET
Basophils Absolute: 0 10*3/uL (ref 0.0–0.1)
Basophils Relative: 1 %
EOS PCT: 2 %
Eosinophils Absolute: 0.1 10*3/uL (ref 0.0–0.7)
HEMATOCRIT: 36 % — AB (ref 39.0–52.0)
Hemoglobin: 12.4 g/dL — ABNORMAL LOW (ref 13.0–17.0)
LYMPHS ABS: 1.2 10*3/uL (ref 0.7–4.0)
LYMPHS PCT: 33 %
MCH: 35.2 pg — AB (ref 26.0–34.0)
MCHC: 34.4 g/dL (ref 30.0–36.0)
MCV: 102.3 fL — AB (ref 78.0–100.0)
MONO ABS: 0.3 10*3/uL (ref 0.1–1.0)
Monocytes Relative: 8 %
NEUTROS ABS: 2 10*3/uL (ref 1.7–7.7)
Neutrophils Relative %: 56 %
PLATELETS: 170 10*3/uL (ref 150–400)
RBC: 3.52 MIL/uL — AB (ref 4.22–5.81)
RDW: 14.3 % (ref 11.5–15.5)
WBC: 3.5 10*3/uL — AB (ref 4.0–10.5)

## 2016-04-29 LAB — COMPREHENSIVE METABOLIC PANEL
ALT: 38 U/L (ref 17–63)
AST: 34 U/L (ref 15–41)
Albumin: 3.7 g/dL (ref 3.5–5.0)
Alkaline Phosphatase: 75 U/L (ref 38–126)
Anion gap: 8 (ref 5–15)
BILIRUBIN TOTAL: 0.5 mg/dL (ref 0.3–1.2)
BUN: 10 mg/dL (ref 6–20)
CALCIUM: 9.1 mg/dL (ref 8.9–10.3)
CHLORIDE: 97 mmol/L — AB (ref 101–111)
CO2: 27 mmol/L (ref 22–32)
CREATININE: 0.58 mg/dL — AB (ref 0.61–1.24)
Glucose, Bld: 229 mg/dL — ABNORMAL HIGH (ref 65–99)
Potassium: 4 mmol/L (ref 3.5–5.1)
Sodium: 132 mmol/L — ABNORMAL LOW (ref 135–145)
TOTAL PROTEIN: 7.5 g/dL (ref 6.5–8.1)

## 2016-04-29 MED ORDER — PACLITAXEL PROTEIN-BOUND CHEMO INJECTION 100 MG
125.0000 mg/m2 | Freq: Once | INTRAVENOUS | Status: AC
  Administered 2016-04-29: 200 mg via INTRAVENOUS
  Filled 2016-04-29: qty 40

## 2016-04-29 MED ORDER — SODIUM CHLORIDE 0.9 % IV SOLN
Freq: Once | INTRAVENOUS | Status: AC
  Administered 2016-04-29: 11:00:00 via INTRAVENOUS

## 2016-04-29 MED ORDER — SODIUM CHLORIDE 0.9% FLUSH
10.0000 mL | INTRAVENOUS | Status: DC | PRN
  Administered 2016-04-29: 10 mL
  Filled 2016-04-29: qty 10

## 2016-04-29 MED ORDER — HEPARIN SOD (PORK) LOCK FLUSH 100 UNIT/ML IV SOLN
500.0000 [IU] | Freq: Once | INTRAVENOUS | Status: AC | PRN
  Administered 2016-04-29: 500 [IU]
  Filled 2016-04-29: qty 5

## 2016-04-29 MED ORDER — ONDANSETRON 8 MG PO TBDP
8.0000 mg | ORAL_TABLET | Freq: Once | ORAL | Status: AC
  Administered 2016-04-29: 8 mg via ORAL
  Filled 2016-04-29: qty 1

## 2016-04-29 MED ORDER — SODIUM CHLORIDE 0.9 % IV SOLN
990.0000 mg/m2 | Freq: Once | INTRAVENOUS | Status: AC
  Administered 2016-04-29: 1596 mg via INTRAVENOUS
  Filled 2016-04-29: qty 15.76

## 2016-04-30 NOTE — Progress Notes (Signed)
Tolerated chemo well. Stable and ambulatory on discharge home to self. 

## 2016-05-05 ENCOUNTER — Ambulatory Visit (HOSPITAL_COMMUNITY)
Admission: RE | Admit: 2016-05-05 | Discharge: 2016-05-05 | Disposition: A | Discharge status: Home / Self Care | Payer: Medicaid Other | Source: Ambulatory Visit | Attending: Oncology | Admitting: Oncology

## 2016-05-05 DIAGNOSIS — C257 Malignant neoplasm of other parts of pancreas: Secondary | ICD-10-CM

## 2016-05-05 DIAGNOSIS — C259 Malignant neoplasm of pancreas, unspecified: Secondary | ICD-10-CM | POA: Diagnosis not present

## 2016-05-05 DIAGNOSIS — M5136 Other intervertebral disc degeneration, lumbar region: Secondary | ICD-10-CM | POA: Diagnosis not present

## 2016-05-05 DIAGNOSIS — C787 Secondary malignant neoplasm of liver and intrahepatic bile duct: Secondary | ICD-10-CM | POA: Diagnosis not present

## 2016-05-05 DIAGNOSIS — I700 Atherosclerosis of aorta: Secondary | ICD-10-CM | POA: Diagnosis not present

## 2016-05-05 DIAGNOSIS — R188 Other ascites: Secondary | ICD-10-CM | POA: Diagnosis not present

## 2016-05-05 MED ORDER — IOPAMIDOL (ISOVUE-300) INJECTION 61%
100.0000 mL | Freq: Once | INTRAVENOUS | Status: AC | PRN
  Administered 2016-05-05: 100 mL via INTRAVENOUS

## 2016-05-05 MED ORDER — IOPAMIDOL (ISOVUE-300) INJECTION 61%
INTRAVENOUS | Status: AC
  Administered 2016-05-05: 30 mL
  Filled 2016-05-05: qty 30

## 2016-05-14 ENCOUNTER — Encounter: Payer: Self-pay | Admitting: Dietician

## 2016-05-14 ENCOUNTER — Encounter (HOSPITAL_BASED_OUTPATIENT_CLINIC_OR_DEPARTMENT_OTHER): Payer: Medicaid Other

## 2016-05-14 ENCOUNTER — Encounter (HOSPITAL_COMMUNITY): Payer: Medicaid Other | Attending: Hematology & Oncology | Admitting: Hematology & Oncology

## 2016-05-14 ENCOUNTER — Encounter (HOSPITAL_COMMUNITY): Payer: Self-pay | Admitting: Hematology & Oncology

## 2016-05-14 VITALS — BP 130/59 | HR 80 | Temp 98.5°F | Resp 18 | Wt 111.8 lb

## 2016-05-14 DIAGNOSIS — Z8611 Personal history of tuberculosis: Secondary | ICD-10-CM | POA: Diagnosis present

## 2016-05-14 DIAGNOSIS — G893 Neoplasm related pain (acute) (chronic): Secondary | ICD-10-CM | POA: Diagnosis not present

## 2016-05-14 DIAGNOSIS — C258 Malignant neoplasm of overlapping sites of pancreas: Secondary | ICD-10-CM

## 2016-05-14 DIAGNOSIS — K7689 Other specified diseases of liver: Secondary | ICD-10-CM | POA: Diagnosis present

## 2016-05-14 DIAGNOSIS — K869 Disease of pancreas, unspecified: Secondary | ICD-10-CM | POA: Diagnosis not present

## 2016-05-14 DIAGNOSIS — A310 Pulmonary mycobacterial infection: Secondary | ICD-10-CM

## 2016-05-14 DIAGNOSIS — C787 Secondary malignant neoplasm of liver and intrahepatic bile duct: Secondary | ICD-10-CM | POA: Diagnosis not present

## 2016-05-14 DIAGNOSIS — R1084 Generalized abdominal pain: Secondary | ICD-10-CM | POA: Diagnosis not present

## 2016-05-14 DIAGNOSIS — R739 Hyperglycemia, unspecified: Secondary | ICD-10-CM | POA: Diagnosis not present

## 2016-05-14 DIAGNOSIS — E876 Hypokalemia: Secondary | ICD-10-CM | POA: Insufficient documentation

## 2016-05-14 DIAGNOSIS — C251 Malignant neoplasm of body of pancreas: Secondary | ICD-10-CM | POA: Diagnosis present

## 2016-05-14 DIAGNOSIS — R143 Flatulence: Secondary | ICD-10-CM | POA: Diagnosis present

## 2016-05-14 DIAGNOSIS — Z5111 Encounter for antineoplastic chemotherapy: Secondary | ICD-10-CM

## 2016-05-14 DIAGNOSIS — K5900 Constipation, unspecified: Secondary | ICD-10-CM | POA: Diagnosis present

## 2016-05-14 LAB — CBC WITH DIFFERENTIAL/PLATELET
BASOS ABS: 0 10*3/uL (ref 0.0–0.1)
Basophils Relative: 0 %
EOS ABS: 0.1 10*3/uL (ref 0.0–0.7)
EOS PCT: 1 %
HCT: 37.8 % — ABNORMAL LOW (ref 39.0–52.0)
HEMOGLOBIN: 13.2 g/dL (ref 13.0–17.0)
LYMPHS ABS: 1.5 10*3/uL (ref 0.7–4.0)
LYMPHS PCT: 17 %
MCH: 35.8 pg — ABNORMAL HIGH (ref 26.0–34.0)
MCHC: 34.9 g/dL (ref 30.0–36.0)
MCV: 102.4 fL — AB (ref 78.0–100.0)
Monocytes Absolute: 1.3 10*3/uL — ABNORMAL HIGH (ref 0.1–1.0)
Monocytes Relative: 15 %
NEUTROS PCT: 67 %
Neutro Abs: 5.8 10*3/uL (ref 1.7–7.7)
PLATELETS: 445 10*3/uL — AB (ref 150–400)
RBC: 3.69 MIL/uL — AB (ref 4.22–5.81)
RDW: 16 % — ABNORMAL HIGH (ref 11.5–15.5)
WBC: 8.7 10*3/uL (ref 4.0–10.5)

## 2016-05-14 LAB — COMPREHENSIVE METABOLIC PANEL
ALK PHOS: 78 U/L (ref 38–126)
ALT: 29 U/L (ref 17–63)
AST: 33 U/L (ref 15–41)
Albumin: 3.9 g/dL (ref 3.5–5.0)
Anion gap: 7 (ref 5–15)
BUN: 9 mg/dL (ref 6–20)
CALCIUM: 9.2 mg/dL (ref 8.9–10.3)
CHLORIDE: 97 mmol/L — AB (ref 101–111)
CO2: 27 mmol/L (ref 22–32)
CREATININE: 0.62 mg/dL (ref 0.61–1.24)
GFR calc Af Amer: >60 mL/min (ref >60)
GFR calc non Af Amer: >60 mL/min (ref >60)
Glucose, Bld: 172 mg/dL — ABNORMAL HIGH (ref 65–99)
Potassium: 3.8 mmol/L (ref 3.5–5.1)
SODIUM: 131 mmol/L — AB (ref 135–145)
Total Bilirubin: 0.4 mg/dL (ref 0.3–1.2)
Total Protein: 7.6 g/dL (ref 6.5–8.1)

## 2016-05-14 LAB — VITAMIN B12: Vitamin B-12: 1086 pg/mL — ABNORMAL HIGH (ref 180–914)

## 2016-05-14 LAB — FOLATE: FOLATE: 53.1 ng/mL (ref >5.9)

## 2016-05-14 MED ORDER — OXYCODONE HCL 10 MG PO TABS: 10.0000 mg | ORAL_TABLET | ORAL | 0 refills | Status: DC | PRN

## 2016-05-14 MED ORDER — SODIUM CHLORIDE 0.9% FLUSH
10.0000 mL | INTRAVENOUS | Status: DC | PRN
Start: 2016-05-14 — End: 2016-05-14
  Administered 2016-05-14: 10 mL
  Filled 2016-05-14: qty 10

## 2016-05-14 MED ORDER — GEMCITABINE HCL CHEMO INJECTION 1 GM/26.3ML
990.0000 mg/m2 | Freq: Once | INTRAVENOUS | Status: AC
  Administered 2016-05-14: 1596 mg via INTRAVENOUS
  Filled 2016-05-14: qty 42

## 2016-05-14 MED ORDER — PACLITAXEL PROTEIN-BOUND CHEMO INJECTION 100 MG
125.0000 mg/m2 | Freq: Once | INTRAVENOUS | Status: AC
  Administered 2016-05-14: 200 mg via INTRAVENOUS
  Filled 2016-05-14: qty 40

## 2016-05-14 MED ORDER — HEPARIN SOD (PORK) LOCK FLUSH 100 UNIT/ML IV SOLN
500.0000 [IU] | Freq: Once | INTRAVENOUS | Status: AC | PRN
  Administered 2016-05-14: 500 [IU]
  Filled 2016-05-14: qty 5

## 2016-05-14 MED ORDER — ONDANSETRON 8 MG PO TBDP
8.0000 mg | ORAL_TABLET | Freq: Once | ORAL | Status: AC
  Administered 2016-05-14: 8 mg via ORAL
  Filled 2016-05-14: qty 1

## 2016-05-14 MED ORDER — SODIUM CHLORIDE 0.9 % IV SOLN
Freq: Once | INTRAVENOUS | Status: AC
  Administered 2016-05-14: 12:00:00 via INTRAVENOUS

## 2016-05-14 NOTE — Progress Notes (Signed)
Tolerated chemo well. Stable and ambulatory on discharge home with sister.

## 2016-05-14 NOTE — Progress Notes (Signed)
Follow up with high risk pancreatic cancer pt  Contacted Pt by visiting prior to office visit   Wt Readings from Last 10 Encounters:  05/14/16 111 lb 12.8 oz (50.7 kg)  04/29/16 115 lb 12.8 oz (52.5 kg)  04/22/16 113 lb 6.4 oz (51.4 kg)  04/16/16 113 lb (51.3 kg)  04/01/16 113 lb 3.2 oz (51.3 kg)  03/25/16 113 lb (51.3 kg)  03/11/16 113 lb 12.8 oz (51.6 kg)  03/04/16 112 lb 12.8 oz (51.2 kg)  02/19/16 113 lb 3.2 oz (51.3 kg)  02/12/16 112 lb 1.6 oz (50.8 kg)   Patient weight has decreased by 4 lbs in the last two weeks, however, pt says when he was weighed at 116 lbs, he was wearing "a heavy coat that likely weighed 5 lbs". Not taking into account this measurement, he is down ~.5-1 lb from his normal.   Patient reports oral intake as "at 75% normal". He reports that his stomach "gripiness" is worse recently. He says this impacts his desire to eat. He says when this first began, he took pepto bismol which successfully alleviated it. He states he later was told not to take pepto bismol. RD verified with MD that it is OK for patient to have pepto bismol. Encouraged pt to use this for his "gripiness"  Pt had some complaints about his pain medication prescriptions. He reports some episodes of severe pain and running out of medications before he is allowed to refill them. Deferred this to MD.    He is eating 2-3 meals. If he eats two, then he will try to snack more. He says PO intake does not worsen the "gripiness". He has started drinking Ensure less, which may correlate with his weight loss.   Pt is given more coupons and samples of supplements today. He is urged to pursue the online web offer through [employer] to receive $50 dollars in coupons.   Left  Coupons and sample today.   RD to continue to follow periodically.   Burtis Junes RD, LDN, [place] [facility] Pager: [phone] 05/14/2016 1:07 PM

## 2016-05-14 NOTE — Patient Instructions (Signed)
[facility] Discharge Instructions for Patients Receiving Chemotherapy   Beginning January 23rd 2017 lab work for the [facility] will be done in the  Main lab at [facility] on 1st floor. If you have a lab appointment with the [facility] please come in thru the  Main Entrance and check in at the main information desk   Today you received the following chemotherapy agents gemzar and abraxane.  To help prevent nausea and vomiting after your treatment, we encourage you to take your nausea medication as directed.   If you develop nausea and vomiting, or diarrhea that is not controlled by your medication, call the clinic.  The clinic phone number is [phone]. Office hours are Monday-Friday 8:30am-5:00pm.  BELOW ARE SYMPTOMS THAT SHOULD BE REPORTED IMMEDIATELY:  *FEVER GREATER THAN 101.0 F  *CHILLS WITH OR WITHOUT FEVER  NAUSEA AND VOMITING THAT IS NOT CONTROLLED WITH YOUR NAUSEA MEDICATION  *UNUSUAL SHORTNESS OF BREATH  *UNUSUAL BRUISING OR BLEEDING  TENDERNESS IN MOUTH AND THROAT WITH OR WITHOUT PRESENCE OF ULCERS  *URINARY PROBLEMS  *BOWEL PROBLEMS  UNUSUAL RASH Items with * indicate a potential emergency and should be followed up as soon as possible. If you have an emergency after office hours please contact your primary care physician or go to the nearest emergency department.  Please call the clinic during office hours if you have any questions or concerns.   You may also contact the Patient Navigator at [phone] should you have any questions or need assistance in obtaining follow up care.      Resources For Cancer Patients and their Caregivers ? American Cancer Society: Can assist with transportation, wigs, general needs, runs Look Good Feel Better.        938-830-5326 ? Cancer Care: Provides financial assistance, online support groups, medication/co-pay assistance.  1-800-813-HOPE (440) 333-2831) ? [place] Assists [place] cancer patients and their families through emotional , educational and financial support.  947-302-1813 ? [place] DSS Where to apply for food stamps, Medicaid and utility assistance. 414-886-6223 ? RCATS: Transportation to medical appointments. 979-837-3193 ? Social Security Administration: May apply for disability if have a Stage IV cancer. 726-165-1258 807-276-5805 ? [employer], Disability and Transit Services: Assists with nutrition, care and transit needs. 732-268-2874

## 2016-05-14 NOTE — Patient Instructions (Addendum)
[facility] at [facility] Discharge Instructions  RECOMMENDATIONS MADE BY THE CONSULTANT AND ANY TEST RESULTS WILL BE SENT TO YOUR REFERRING PHYSICIAN.  You saw Dr.Penland today. Will refer you to GI doctor because of stomach pain. Follow up at beginning of next cycle of treatment with labs and MD appt. See Amy at checkout for appointments.  Thank you for choosing [place] at [facility] to provide your oncology and hematology care.  To afford each patient quality time with our provider, please arrive at least 15 minutes before your scheduled appointment time.   Beginning January 23rd 2017 lab work for the [facility] will be done in the  Main lab at [facility] on 1st floor. If you have a lab appointment with the [facility] please come in thru the  Main Entrance and check in at the main information desk  You need to re-schedule your appointment should you arrive 10 or more minutes late.  We strive to give you quality time with our providers, and arriving late affects you and other patients whose appointments are after yours.  Also, if you no show three or more times for appointments you may be dismissed from the clinic at the providers discretion.     Again, thank you for choosing [facility].  Our hope is that these requests will decrease the amount of time that you wait before being seen by our physicians.       _____________________________________________________________  Should you have questions after your visit to [facility], please contact our office at [phone] between the hours of 8:30 a.m. and 4:30 p.m.  Voicemails left after 4:30 p.m. will not be returned until the following business day.  For prescription refill requests, have your pharmacy contact our office.         Resources For Cancer Patients and their Caregivers ? American Cancer Society: Can assist with transportation, wigs, general  needs, runs Look Good Feel Better.        610-076-5738 ? Cancer Care: Provides financial assistance, online support groups, medication/co-pay assistance.  1-800-813-HOPE 825-530-4276) ? [place] Assists [place] cancer patients and their families through emotional , educational and financial support.  510-031-6553 ? [place] DSS Where to apply for food stamps, Medicaid and utility assistance. (651) 826-2626 ? RCATS: Transportation to medical appointments. 803-661-8737 ? Social Security Administration: May apply for disability if have a Stage IV cancer. (253)533-3161 8024564469 ? [employer], Disability and Transit Services: Assists with nutrition, care and transit needs. [facility] Support Programs: @10RELATIVEDAYS @ > Cancer Support Group  2nd Tuesday of the month 1pm-2pm, Journey Room  > Creative Journey  3rd Tuesday of the month 1130am-1pm, Journey Room  > Look Good Feel Better  1st Wednesday of the month 10am-12 noon, Journey Room (Call [facility] to register [phone])

## 2016-05-14 NOTE — Progress Notes (Signed)
[facility]  Progress Note  Patient Care Team: Provider Not In System as PCP - General  CHIEF COMPLAINTS/PURPOSE OF CONSULTATION:  Adenocarcinoma of body/tail of pancreas, T4N0Mx History of TB, treated by [facility] History of MAC Weight Loss History Hyponatremia Tobacco Abuse    Pancreatic cancer ([place])   11/11/2015 - 11/13/2015 Hospital Admission    abdominal pain, cramping, diarrhea, weight loss, hyponatremia CT pancreatic body mass      11/12/2015 Imaging    4.7 cm mass of pancreas to L of midline, involvement of several vascular structures also possibly the distal duodenum      11/21/2015 - 11/25/2015 Hospital Admission    EUS on 5/23, hyponatremia      11/22/2015 Imaging    Diffuse mesenteric edema, large hypo enhancing pancreatic mass, abutment of celiac axis, SMA, encasement of splenic artery, occludes splenic vein, compresses porta splenic confluence      11/25/2015 Procedure    EUS Dr. Ardis Hughs, irreg mass in pancreatic body/tail. 4.2 cm, involvement of major blood vessels not appreciated due to size of mass      11/25/2015 Pathology Results    malignant cells c/w adenocarcinoma      12/04/2015 Miscellaneous    Acid Fast Smear negative      12/04/2015 Imaging    No convincing evidence of pulm mets, improvement in LLL, RUL angular nodular thickening seen on comparison CT from 2013, residual cavitation remains at the L lung base, one nodule oblique fissue on R 63m new      12/08/2015 Procedure    Port a cath      12/09/2015 -  Chemotherapy    Gemzar/Abraxane days 1, 8, and 15 every 28 days.      12/12/2015 Imaging    MRI abd- Limited MRI, D/C prior to completion at patient request. Large infiltrative 8.1 x 4.7 x 5.8 cm pancreatic mass centered at the junction of the pancreatic body and tail, consistent with known primary pancreatic adenocarc      12/23/2015 Treatment Plan Change    Treatment deferred x 7 days due to thrombocytopenia      01/14/2016 Imaging    CT abd/pelvis- Mass in the body/ tail of the pancreas consistent with known pancreatic carcinoma. Likely metastases to the liver demonstrating some progression since previous study.      02/26/2016 Treatment Plan Change    Day 15 deleted from treatment plan.  Chemotherapy is now days 1, 8 every 28 days.      05/06/2016 Imaging    CT abd/pelvis- 1. Mixed response to therapy. 2. Decrease in size of tumor within the tail of pancreas. 3. Several new liver metastases are identified. Previously noted liver metastasis have resolved or calcified.       HISTORY OF PRESENTING ILLNESS:  Nicholas LIGHTSEY65y.o. male is here for additional follow-up of locally advanced pancreatic cancer. Not felt to be a surgical candidate. On Gemzar/Abraxane.  Patient is accompanied today by his sister. He presents in the treatment chair, laying down with a blanket on. Unfortunately sister thinks this is curable in spite of conversations about incurability of his disease.   He has been experiencing sharp pains in the side of his stomach. He says that his stomach feels "sour". "my stomach just don't feel right".   I have reviewed the labs with the patient.   He says the oxycodone was causing constipation and stomach cramps. He says he's been running out and that the  pharmacy hasn't been letting him pick up his prescription early. Last month, he ran out and they didn't let him refill the prescription until November 1.  He says he was walking the other day and suddenly got a sharp pain that shot up to his chest. He went home, laid down, and took 3 oxycodone before it finally stopped. This has been an ongoing issue.  No significant weight gain. He has met with nutrition. History of MAC. Followed at the [place].   MEDICAL HISTORY:  Past Medical History:  Diagnosis Date  . Abdominal pain in male    "was told he has a Mass near pancreas"  . Cancer (HCC)    Skin cancer-ear, skin cancer -leeg,cancer  of leg muscle- left., basal cell   . Pancreatic cancer ([place]) 12/02/2015  . Pancreatic mass april 2017  . TB (pulmonary tuberculosis)    3 yrs ago-tx" positive TB test aslso" was told he is not contagious.treatment done for regulat TB.    SURGICAL HISTORY: Past Surgical History:  Procedure Laterality Date  . COLONOSCOPY W/ POLYPECTOMY    . EUS N/A 11/25/2015   Procedure: ESOPHAGEAL ENDOSCOPIC ULTRASOUND (EUS) RADIAL;  Surgeon: Milus Banister, MD;  Location: [facility];  Service: Endoscopy;  Laterality: N/A;  . LEG SURGERY Left    " cancer removed from muscle"    SOCIAL HISTORY: Social History   Social History  . Marital status: Single    Spouse name: N/A  . Number of children: N/A  . Years of education: N/A   Occupational History  . Not on file.   Social History Main Topics  . Smoking status: Current Every Day Smoker    Packs/day: 0.50    Years: 30.00    Types: Cigarettes  . Smokeless tobacco: Never Used  . Alcohol use No     Comment: 1-2 beers at night - pt states he can't remember the last time he drank any alcohol  . Drug use: No  . Sexual activity: No   Other Topics Concern  . Not on file   Social History Narrative  . No narrative on file   Single. He has been engaged 4 times. No children He has cut his smoking back since he had tuberculosis. He has smoked more with recent pain. Sometimes 1 ppd, "it varies". Since last Thursday, he has had 2 beers. It does not taste right. He has never had acid reflux before, but now he does. He was in the [employer]. Worked in Office manager, [place], and the post office.   FAMILY HISTORY: Family History  Problem Relation Age of Onset  . Stroke Mother   . Pneumonia Father    Mother living 29 years old at [employer] [facility] and rehabilitation center.  Father deceased at 26 years old of pneumonia. He drank and drank a lot. [employer] English as a second language teacher. Had a massive heart attack at 65 yo 1 sister, lives in [place]:   has No Known Allergies.  MEDICATIONS:  Current Outpatient Prescriptions  Medication Sig Dispense Refill  . aspirin EC 81 MG tablet Take 81 mg by mouth daily.    . feeding supplement, ENSURE ENLIVE, (ENSURE ENLIVE) LIQD Take 237 mLs by mouth 2 (two) times daily between meals. 60 Bottle 0  . Gemcitabine HCl (GEMZAR IV) Inject into the vein. To begin June 6    . hydrocerin (EUCERIN) CREA Apply 1 application topically 2 (two) times daily as needed (For dry skin.).    Marland Kitchen lidocaine-prilocaine (EMLA)  cream Apply a quarter size amount to port site 1 hour prior to chemo. Do not rub in. Cover with plastic wrap. 30 g 3  . lipase/protease/amylase (CREON) 36000 UNITS CPEP capsule Take 1 capsule (36,000 Units total) by mouth 3 (three) times daily before meals. 180 capsule 3  . Menthol-Methyl Salicylate (THERA-GESIC) 1-15 % CREA Apply 1 application topically 4 (four) times daily as needed (For pain.).    Marland Kitchen mirtazapine (REMERON) 15 MG tablet Take on tablet po qhs for 5 days then increase to two tablets po qhs 60 tablet 3  . Multiple Vitamins-Minerals (MULTIVITAMINS THER. W/MINERALS) TABS tablet Take 1 tablet by mouth daily. Reported on 12/09/2015    . ondansetron (ZOFRAN) 8 MG tablet Take 1 tablet (8 mg total) by mouth every 8 (eight) hours as needed for nausea or vomiting. 30 tablet 2  . oxyCODONE (OXYCONTIN) 15 mg 12 hr tablet Take 1 tablet (15 mg total) by mouth every 8 (eight) hours. 90 tablet 0  . Oxycodone HCl 10 MG TABS Take 1 tablet (10 mg total) by mouth every 4 (four) hours as needed. Ok to fill on 05/21/2016, dose change 120 tablet 0  . PACLitaxel Protein-Bound Part (ABRAXANE IV) Inject into the vein. To begin June 6    . polyethylene glycol (GOLYTELY) 236 g solution Take 4,000 mLs by mouth once. 4000 mL 0  . polyethylene glycol (MIRALAX / GLYCOLAX) packet Take 17 g by mouth daily. (Patient taking differently: Take 17 g by mouth daily as needed for mild constipation. ) 14 each 0  . prochlorperazine  (COMPAZINE) 10 MG tablet Take 1 tablet (10 mg total) by mouth every 6 (six) hours as needed for nausea or vomiting. 30 tablet 2  . senna-docusate (SENOKOT S) 8.6-50 MG tablet Take 2 tablets by mouth daily.    . sildenafil (VIAGRA) 100 MG tablet Take 0.5 tablets (50 mg total) by mouth daily as needed for erectile dysfunction. 10 tablet 0   No current facility-administered medications for this visit.    Facility-Administered Medications Ordered in Other Visits  Medication Dose Route Frequency Provider Last Rate Last Dose  . gemcitabine (GEMZAR) 1,596 mg in sodium chloride 0.9 % 100 mL chemo infusion  990 mg/m2 (Treatment Plan Recorded) Intravenous Once Patrici Ranks, MD      . heparin lock flush 100 unit/mL  500 Units Intracatheter Once PRN Patrici Ranks, MD      . PACLitaxel-protein bound (ABRAXANE) chemo infusion 200 mg  125 mg/m2 (Treatment Plan Recorded) Intravenous Once Patrici Ranks, MD      . sodium chloride flush (NS) 0.9 % injection 10 mL  10 mL Intracatheter PRN Patrici Ranks, MD   10 mL at 05/14/16 1134    Review of Systems  Constitutional: Negative.   HENT: Negative.   Eyes: Negative.   Respiratory: Positive for shortness of breath.        Chronic SOB  Cardiovascular: Positive for chest pain.       Sudden shooting chest pain one day  Gastrointestinal: Positive for abdominal pain and constipation.       Sharp pains on side of abdomin. Constipation and abdominal cramps secondary to oxycodone.   Genitourinary: Negative.   Musculoskeletal: Negative.   Skin: Negative.   Neurological: Negative.   Endo/Heme/Allergies: Negative.   Psychiatric/Behavioral: Negative.   All other systems reviewed and are negative. 14 point ROS was done and is otherwise as detailed above or in HPI    PHYSICAL EXAMINATION: ECOG PERFORMANCE  STATUS: 1 - Symptomatic but completely ambulatory  Vitals with BMI 05/14/2016  Height   Weight 111 lbs 13 oz  BMI   Systolic 778  Diastolic  80  Pulse 242  Respirations 18    Physical Exam  Constitutional: He is oriented to person, place, and time and well-developed, well-nourished, and in no distress.  Patient is very thin.   HENT:  Head: Normocephalic and atraumatic.  Mouth/Throat: Oropharynx is clear and moist.  Eyes: Conjunctivae and EOM are normal. Pupils are equal, round, and reactive to light. Right eye exhibits no discharge. Left eye exhibits no discharge. No scleral icterus.  Neck: Normal range of motion. Neck supple. No JVD present. No tracheal deviation present. No thyromegaly present.  Cardiovascular: Normal rate, regular rhythm and normal heart sounds.  Exam reveals no gallop and no friction rub.   No murmur heard. Pulmonary/Chest: Effort normal and breath sounds normal. No stridor.  Abdominal: Soft. Bowel sounds are normal. He exhibits no distension and no mass. There is no tenderness. There is no rebound and no guarding.  Musculoskeletal: Normal range of motion. He exhibits no edema or tenderness.  Lymphadenopathy:    He has no cervical adenopathy.  Neurological: He is alert and oriented to person, place, and time. Gait normal.  Skin: Skin is warm and dry.  Psychiatric: Mood, memory, affect and judgment normal.  Nursing note and vitals reviewed.   LABORATORY DATA:  I have reviewed the data as listed Lab Results  Component Value Date   WBC 8.7 05/14/2016   HGB 13.2 05/14/2016   HCT 37.8 (L) 05/14/2016   MCV 102.4 (H) 05/14/2016   PLT 445 (H) 05/14/2016   CMP     Component Value Date/Time   NA 131 (L) 05/14/2016 1041   K 3.8 05/14/2016 1041   CL 97 (L) 05/14/2016 1041   CO2 27 05/14/2016 1041   GLUCOSE 172 (H) 05/14/2016 1041   BUN 9 05/14/2016 1041   CREATININE 0.62 05/14/2016 1041   CALCIUM 9.2 05/14/2016 1041   PROT 7.6 05/14/2016 1041   ALBUMIN 3.9 05/14/2016 1041   AST 33 05/14/2016 1041   ALT 29 05/14/2016 1041   ALKPHOS 78 05/14/2016 1041   BILITOT 0.4 05/14/2016 1041   GFRNONAA >60  05/14/2016 1041   GFRAA >60 05/14/2016 1041    Results for YASIEL, GOYNE B (MRN [id number])   Ref. Range 03/25/2016 09:51 04/01/2016 10:58 04/22/2016 11:16 05/14/2016 10:41  CA 19-9 Latest Ref Range: 0 - 35 U/mL 300 (H) 228 (H) 149 (H) 141 (H)    PATHOLOGY:     RADIOGRAPHIC STUDIES: I have personally reviewed the radiological images as listed and agreed with the findings in the report.  Study Result   CLINICAL DATA:  Restaging pancreas cancer  EXAM: CT ABDOMEN AND PELVIS WITH CONTRAST  TECHNIQUE: Multidetector CT imaging of the abdomen and pelvis was performed using the standard protocol following bolus administration of intravenous contrast.  CONTRAST:  59m ISOVUE-300 IOPAMIDOL (ISOVUE-300) INJECTION 61%, 1030mISOVUE-300 IOPAMIDOL (ISOVUE-300) INJECTION 61%  COMPARISON:  01/14/2016  FINDINGS: Lower chest: No acute abnormality.  Hepatobiliary: Within the posterior right lobe of liver there is a 11 mm low-attenuation lesion, 28 of series 2. New from previous exam. Treated lesion within segment 5 of the liver measures 3 mm in appears calcified. Previously 10 mm and noncalcified. New lesion within the lateral right lobe of liver measures 1.5 cm, image 16 of series 2. Also new from previous exam is a 8 mm low-attenuation  structure, image 14 of series 2. 5 mm lesion within the medial segment of left lobe of liver has resolved in the interval. The gallbladder is normal. No biliary dilatation.  Pancreas: Tumor involving the tail of pancreas measures 4.7 x 1.9 cm, image 20 of series 2. Previously 6.7 x 3.0 cm.  Spleen: Normal in size without focal abnormality.  Adrenals/Urinary Tract: Normal appearance of the right adrenal gland. Enlargement of the left adrenal gland appears similar to the previous exam, image 15 of series 2. The kidneys are unremarkable. There is mild ventral bladder wall thickening.  Stomach/Bowel: Stomach is within normal limits.  Appendix appears normal. No evidence of bowel wall thickening, distention, or inflammatory changes.  Vascular/Lymphatic: Aortic atherosclerosis. No aneurysm. No upper abdominal adenopathy. There is no pelvic or inguinal adenopathy.  Reproductive: Prostate is unremarkable.  Other: A small amount of ascites is identified within the dependent portion of the pelvis. Decreased perihepatic ascites.  Musculoskeletal: Degenerative disc disease noted within the lumbar spine. No aggressive lytic or sclerotic bone lesions.  IMPRESSION: 1. Mixed response to therapy. 2. Decrease in size of tumor within the tail of pancreas. 3. Several new liver metastases are identified. Previously noted liver metastasis have resolved or calcified.   Electronically Signed   By: Kerby Moors M.D.   On: 05/05/2016 15:54        EUS:        ASSESSMENT & PLAN:  Adenocarcinoma of body/tail of pancreas, T4N0M1, Stage IV disease History of TB, [facility] by [facility] MAC Weight Loss History Hyponatremia Tobacco Abuse Cancer related pain Hypokalemia Hyperglycemia  He presents for ongoing Abraxane/Gemzar. Ca 19-9 is declining. Weight has stabilized. Unfortunately he has not gained. He continues to have ongoing GI/abdominal complaints, I will refer him to GI.  I will schedule him for a repeat CT in 8-10 weeks.  Blood sugars have been elevated. I discussed with him, that he may have developed diabetes. Will check a HgA1c and also a fasting glucose.  I will also check his B12 and folate levels today given his macrocytosis.   I will look into a way that he won't run out of oxycodone before he can refill his prescription on the 1st of the month.   He will return for cycle 8, day 1 with labs.    Again, I am concerned that the new findings in his liver are not malignancy, all prior disease is resolved, CA 19-9 has declined, patient has a known history of MAC. Will re-evaluate on  repeat imaging with further recommendations at that time.  Orders Placed This Encounter  Procedures  . CBC with Differential    Standing Status:   Future    Number of Occurrences:   1    Standing Expiration Date:   05/14/2017  . Comprehensive metabolic panel    Standing Status:   Future    Number of Occurrences:   1    Standing Expiration Date:   05/14/2017  . Cancer antigen 19-9    Standing Status:   Future    Number of Occurrences:   1    Standing Expiration Date:   05/14/2017    All questions were answered. The patient knows to call the clinic with any problems, questions or concerns.  This document serves as a record of services personally performed by Ancil Linsey, MD. It was created on her behalf by Martinique Casey, a trained medical scribe. The creation of this record is based on the scribe's  personal observations and the provider's statements to them. This document has been checked and approved by the attending provider.  I have reviewed the above documentation for accuracy and completeness, and I agree with the above.   This note was electronically signed.    Molli Hazard, MD  05/14/2016 12:24 PM

## 2016-05-15 LAB — CANCER ANTIGEN 19-9: CA 19 9: 141 U/mL — AB (ref 0–35)

## 2016-05-15 LAB — HEMOGLOBIN A1C
Hgb A1c MFr Bld: 5.5 % (ref 4.8–5.6)
MEAN PLASMA GLUCOSE: 111 mg/dL

## 2016-05-18 ENCOUNTER — Telehealth (HOSPITAL_COMMUNITY): Payer: Self-pay | Admitting: *Deleted

## 2016-05-18 NOTE — Telephone Encounter (Signed)
Called patient and left message on voicemail for him to call back 05/19/16.

## 2016-05-20 ENCOUNTER — Encounter (HOSPITAL_BASED_OUTPATIENT_CLINIC_OR_DEPARTMENT_OTHER): Payer: Medicaid Other

## 2016-05-20 VITALS — BP 133/71 | HR 76 | Temp 98.8°F | Resp 18 | Wt 113.0 lb

## 2016-05-20 DIAGNOSIS — C258 Malignant neoplasm of overlapping sites of pancreas: Secondary | ICD-10-CM

## 2016-05-20 DIAGNOSIS — C251 Malignant neoplasm of body of pancreas: Secondary | ICD-10-CM

## 2016-05-20 DIAGNOSIS — Z5111 Encounter for antineoplastic chemotherapy: Secondary | ICD-10-CM | POA: Diagnosis present

## 2016-05-20 DIAGNOSIS — K869 Disease of pancreas, unspecified: Secondary | ICD-10-CM | POA: Diagnosis not present

## 2016-05-20 LAB — COMPREHENSIVE METABOLIC PANEL
ALT: 45 U/L (ref 17–63)
ANION GAP: 7 (ref 5–15)
AST: 48 U/L — ABNORMAL HIGH (ref 15–41)
Albumin: 3.8 g/dL (ref 3.5–5.0)
Alkaline Phosphatase: 78 U/L (ref 38–126)
BUN: 7 mg/dL (ref 6–20)
CHLORIDE: 96 mmol/L — AB (ref 101–111)
CO2: 27 mmol/L (ref 22–32)
CREATININE: 0.47 mg/dL — AB (ref 0.61–1.24)
Calcium: 9.2 mg/dL (ref 8.9–10.3)
Glucose, Bld: 158 mg/dL — ABNORMAL HIGH (ref 65–99)
POTASSIUM: 4 mmol/L (ref 3.5–5.1)
Sodium: 130 mmol/L — ABNORMAL LOW (ref 135–145)
Total Bilirubin: 0.5 mg/dL (ref 0.3–1.2)
Total Protein: 7.3 g/dL (ref 6.5–8.1)

## 2016-05-20 LAB — CBC WITH DIFFERENTIAL/PLATELET
Basophils Absolute: 0.1 10*3/uL (ref 0.0–0.1)
Basophils Relative: 1 %
EOS PCT: 0 %
Eosinophils Absolute: 0 10*3/uL (ref 0.0–0.7)
HCT: 33.6 % — ABNORMAL LOW (ref 39.0–52.0)
Hemoglobin: 11.6 g/dL — ABNORMAL LOW (ref 13.0–17.0)
LYMPHS ABS: 1.2 10*3/uL (ref 0.7–4.0)
LYMPHS PCT: 25 %
MCH: 35 pg — AB (ref 26.0–34.0)
MCHC: 34.5 g/dL (ref 30.0–36.0)
MCV: 101.5 fL — AB (ref 78.0–100.0)
MONO ABS: 0.1 10*3/uL (ref 0.1–1.0)
Monocytes Relative: 2 %
Neutro Abs: 3.5 10*3/uL (ref 1.7–7.7)
Neutrophils Relative %: 72 %
PLATELETS: 365 10*3/uL (ref 150–400)
RBC: 3.31 MIL/uL — ABNORMAL LOW (ref 4.22–5.81)
RDW: 15.1 % (ref 11.5–15.5)
WBC: 4.9 10*3/uL (ref 4.0–10.5)

## 2016-05-20 MED ORDER — ONDANSETRON 8 MG PO TBDP
8.0000 mg | ORAL_TABLET | Freq: Once | ORAL | Status: AC
  Administered 2016-05-20: 8 mg via ORAL
  Filled 2016-05-20: qty 1

## 2016-05-20 MED ORDER — SODIUM CHLORIDE 0.9% FLUSH
10.0000 mL | INTRAVENOUS | Status: DC | PRN
  Administered 2016-05-20: 10 mL
  Filled 2016-05-20: qty 10

## 2016-05-20 MED ORDER — PACLITAXEL PROTEIN-BOUND CHEMO INJECTION 100 MG
125.0000 mg/m2 | Freq: Once | INTRAVENOUS | Status: AC
  Administered 2016-05-20: 200 mg via INTRAVENOUS
  Filled 2016-05-20: qty 40

## 2016-05-20 MED ORDER — HEPARIN SOD (PORK) LOCK FLUSH 100 UNIT/ML IV SOLN
500.0000 [IU] | Freq: Once | INTRAVENOUS | Status: AC | PRN
  Administered 2016-05-20: 500 [IU]
  Filled 2016-05-20: qty 5

## 2016-05-20 MED ORDER — SODIUM CHLORIDE 0.9 % IV SOLN
1596.0000 mg | Freq: Once | INTRAVENOUS | Status: AC
  Administered 2016-05-20: 1596 mg via INTRAVENOUS
  Filled 2016-05-20: qty 26.3

## 2016-05-20 MED ORDER — SODIUM CHLORIDE 0.9 % IV SOLN
Freq: Once | INTRAVENOUS | Status: AC
  Administered 2016-05-20: 12:00:00 via INTRAVENOUS

## 2016-05-20 NOTE — Patient Instructions (Signed)
[facility] Discharge Instructions for Patients Receiving Chemotherapy   Beginning January 23rd 2017 lab work for the Cancer Center will be done in the  Main lab at [facility] on 1st floor. If you have a lab appointment with the Cancer Center please come in thru the  Main Entrance and check in at the main information desk   Today you received the following chemotherapy agents Abraxane and Gemzar. Follow-up as scheduled. Call clinic for any questions or concerns  To help prevent nausea and vomiting after your treatment, we encourage you to take your nausea medication   If you develop nausea and vomiting, or diarrhea that is not controlled by your medication, call the clinic.  The clinic phone number is [phone]. Office hours are Monday-Friday 8:30am-5:00pm.  BELOW ARE SYMPTOMS THAT SHOULD BE REPORTED IMMEDIATELY:  *FEVER GREATER THAN 101.0 F  *CHILLS WITH OR WITHOUT FEVER  NAUSEA AND VOMITING THAT IS NOT CONTROLLED WITH YOUR NAUSEA MEDICATION  *UNUSUAL SHORTNESS OF BREATH  *UNUSUAL BRUISING OR BLEEDING  TENDERNESS IN MOUTH AND THROAT WITH OR WITHOUT PRESENCE OF ULCERS  *URINARY PROBLEMS  *BOWEL PROBLEMS  UNUSUAL RASH Items with * indicate a potential emergency and should be followed up as soon as possible. If you have an emergency after office hours please contact your primary care physician or go to the nearest emergency department.  Please call the clinic during office hours if you have any questions or concerns.   You may also contact the Patient Navigator at [phone] should you have any questions or need assistance in obtaining follow up care.      Resources For Cancer Patients and their Caregivers ? American Cancer Society: Can assist with transportation, wigs, general needs, runs Look Good Feel Better.        1-888-227-6333 ? Cancer Care: Provides financial assistance, online support groups, medication/co-pay assistance.   1-800-813-HOPE (4673) ? [facility] Assists [place] cancer patients and their families through emotional , educational and financial support.  336-427-4357 ? [place] DSS Where to apply for food stamps, Medicaid and utility assistance. 336-342-1394 ? [employer]: Transportation to medical appointments. 336-347-2287 ? Social Security Administration: May apply for disability if have a Stage IV cancer. 336-342-7796 1-800-772-1213 ? [place] Aging, Disability and Transit Services: Assists with nutrition, care and transit needs. 336-349-2343         

## 2016-05-20 NOTE — Telephone Encounter (Signed)
Pt notified of recent lab results

## 2016-05-20 NOTE — Progress Notes (Signed)
Nicholas [name] tolerated chemo tx well without complaints or incident. Labs reviewed prior to administering chemotherapy VSS upon discharge. Pt discharged self ambulatory in satisfactory condition

## 2016-05-21 ENCOUNTER — Ambulatory Visit (HOSPITAL_COMMUNITY): Payer: Medicaid Other

## 2016-05-25 ENCOUNTER — Telehealth (HOSPITAL_COMMUNITY): Payer: Self-pay | Admitting: *Deleted

## 2016-05-26 ENCOUNTER — Other Ambulatory Visit (HOSPITAL_COMMUNITY): Payer: Self-pay

## 2016-05-26 DIAGNOSIS — G893 Neoplasm related pain (acute) (chronic): Secondary | ICD-10-CM

## 2016-05-26 MED ORDER — OXYCODONE HCL ER 15 MG PO T12A: 15.0000 mg | EXTENDED_RELEASE_TABLET | Freq: Three times a day (TID) | ORAL | 0 refills | Status: DC

## 2016-06-03 ENCOUNTER — Encounter (HOSPITAL_BASED_OUTPATIENT_CLINIC_OR_DEPARTMENT_OTHER): Payer: Medicaid Other | Admitting: Hematology & Oncology

## 2016-06-03 ENCOUNTER — Encounter (HOSPITAL_COMMUNITY): Payer: Self-pay | Admitting: Hematology & Oncology

## 2016-06-03 ENCOUNTER — Encounter (HOSPITAL_BASED_OUTPATIENT_CLINIC_OR_DEPARTMENT_OTHER): Payer: Medicaid Other

## 2016-06-03 VITALS — BP 146/82 | HR 81 | Temp 98.3°F | Resp 20

## 2016-06-03 VITALS — BP 139/82 | HR 110 | Temp 97.9°F | Resp 20 | Wt 111.4 lb

## 2016-06-03 DIAGNOSIS — C251 Malignant neoplasm of body of pancreas: Secondary | ICD-10-CM

## 2016-06-03 DIAGNOSIS — C787 Secondary malignant neoplasm of liver and intrahepatic bile duct: Secondary | ICD-10-CM | POA: Diagnosis not present

## 2016-06-03 DIAGNOSIS — Z5111 Encounter for antineoplastic chemotherapy: Secondary | ICD-10-CM

## 2016-06-03 DIAGNOSIS — Z720 Tobacco use: Secondary | ICD-10-CM | POA: Diagnosis not present

## 2016-06-03 DIAGNOSIS — G893 Neoplasm related pain (acute) (chronic): Secondary | ICD-10-CM | POA: Diagnosis not present

## 2016-06-03 DIAGNOSIS — C258 Malignant neoplasm of overlapping sites of pancreas: Secondary | ICD-10-CM

## 2016-06-03 DIAGNOSIS — K869 Disease of pancreas, unspecified: Secondary | ICD-10-CM | POA: Diagnosis not present

## 2016-06-03 DIAGNOSIS — Z2239 Carrier of other specified bacterial diseases: Secondary | ICD-10-CM

## 2016-06-03 LAB — CBC WITH DIFFERENTIAL/PLATELET
Basophils Absolute: 0 10*3/uL (ref 0.0–0.1)
Basophils Relative: 0 %
EOS ABS: 0.1 10*3/uL (ref 0.0–0.7)
EOS PCT: 2 %
HCT: 32.2 % — ABNORMAL LOW (ref 39.0–52.0)
HEMOGLOBIN: 11.2 g/dL — AB (ref 13.0–17.0)
LYMPHS ABS: 1.1 10*3/uL (ref 0.7–4.0)
Lymphocytes Relative: 15 %
MCH: 35.4 pg — AB (ref 26.0–34.0)
MCHC: 34.8 g/dL (ref 30.0–36.0)
MCV: 101.9 fL — ABNORMAL HIGH (ref 78.0–100.0)
MONO ABS: 1 10*3/uL (ref 0.1–1.0)
MONOS PCT: 14 %
NEUTROS PCT: 69 %
Neutro Abs: 5 10*3/uL (ref 1.7–7.7)
Platelets: 245 10*3/uL (ref 150–400)
RBC: 3.16 MIL/uL — ABNORMAL LOW (ref 4.22–5.81)
RDW: 16.9 % — AB (ref 11.5–15.5)
WBC: 7.3 10*3/uL (ref 4.0–10.5)

## 2016-06-03 LAB — COMPREHENSIVE METABOLIC PANEL
ALK PHOS: 82 U/L (ref 38–126)
ALT: 30 U/L (ref 17–63)
ANION GAP: 7 (ref 5–15)
AST: 35 U/L (ref 15–41)
Albumin: 3.6 g/dL (ref 3.5–5.0)
BUN: 6 mg/dL (ref 6–20)
CALCIUM: 9.1 mg/dL (ref 8.9–10.3)
CO2: 27 mmol/L (ref 22–32)
Chloride: 98 mmol/L — ABNORMAL LOW (ref 101–111)
Creatinine, Ser: 0.52 mg/dL — ABNORMAL LOW (ref 0.61–1.24)
GFR calc non Af Amer: >60 mL/min (ref >60)
Glucose, Bld: 175 mg/dL — ABNORMAL HIGH (ref 65–99)
Potassium: 4.1 mmol/L (ref 3.5–5.1)
SODIUM: 132 mmol/L — AB (ref 135–145)
TOTAL PROTEIN: 6.8 g/dL (ref 6.5–8.1)
Total Bilirubin: <0.1 mg/dL — ABNORMAL LOW (ref 0.3–1.2)

## 2016-06-03 MED ORDER — HEPARIN SOD (PORK) LOCK FLUSH 100 UNIT/ML IV SOLN
500.0000 [IU] | Freq: Once | INTRAVENOUS | Status: AC | PRN
  Administered 2016-06-03: 500 [IU]
  Filled 2016-06-03: qty 5

## 2016-06-03 MED ORDER — SODIUM CHLORIDE 0.9 % IV SOLN
Freq: Once | INTRAVENOUS | Status: AC
  Administered 2016-06-03: 11:00:00 via INTRAVENOUS

## 2016-06-03 MED ORDER — PACLITAXEL PROTEIN-BOUND CHEMO INJECTION 100 MG
125.0000 mg/m2 | Freq: Once | INTRAVENOUS | Status: AC
  Administered 2016-06-03: 200 mg via INTRAVENOUS
  Filled 2016-06-03: qty 40

## 2016-06-03 MED ORDER — SODIUM CHLORIDE 0.9 % IV SOLN
1596.0000 mg | Freq: Once | INTRAVENOUS | Status: AC
  Administered 2016-06-03: 1596 mg via INTRAVENOUS
  Filled 2016-06-03: qty 15.76

## 2016-06-03 MED ORDER — SODIUM CHLORIDE 0.9% FLUSH: 10.0000 mL | INTRAVENOUS | Status: DC | PRN

## 2016-06-03 MED ORDER — ONDANSETRON 8 MG PO TBDP
8.0000 mg | ORAL_TABLET | Freq: Once | ORAL | Status: AC
  Administered 2016-06-03: 8 mg via ORAL
  Filled 2016-06-03: qty 1

## 2016-06-03 NOTE — Progress Notes (Signed)
Tolerated tx w/o adverse effects.  Alert, in no distress.  VSS.  Discharged ambulatory.

## 2016-06-03 NOTE — Patient Instructions (Addendum)
[place] at [facility] Discharge Instructions  RECOMMENDATIONS MADE BY THE CONSULTANT AND ANY TEST RESULTS WILL BE SENT TO YOUR REFERRING PHYSICIAN.  You saw Dr.Penland today. Referral to GI doctor for EGD. Continue chemo. Scans will be scheduled in January. Follow up with labs at next chemo cycle (around 12/21) See Amy at checkout for appointments.  Thank you for choosing [place] at [facility] to provide your oncology and hematology care.  To afford each patient quality time with our provider, please arrive at least 15 minutes before your scheduled appointment time.   Beginning January 23rd 2017 lab work for the [facility] will be done in the  Main lab at [facility] on 1st floor. If you have a lab appointment with the [facility] please come in thru the  Main Entrance and check in at the main information desk  You need to re-schedule your appointment should you arrive 10 or more minutes late.  We strive to give you quality time with our providers, and arriving late affects you and other patients whose appointments are after yours.  Also, if you no show three or more times for appointments you may be dismissed from the clinic at the providers discretion.     Again, thank you for choosing [facility].  Our hope is that these requests will decrease the amount of time that you wait before being seen by our physicians.       _____________________________________________________________  Should you have questions after your visit to [facility], please contact our office at [phone] between the hours of 8:30 a.m. and 4:30 p.m.  Voicemails left after 4:30 p.m. will not be returned until the following business day.  For prescription refill requests, have your pharmacy contact our office.         Resources For Cancer Patients and their Caregivers ? American Cancer Society: Can assist with transportation,  wigs, general needs, runs Look Good Feel Better.        6466200533 ? Cancer Care: Provides financial assistance, online support groups, medication/co-pay assistance.  1-800-813-HOPE 619-557-5615) ? [facility] Assists [place] cancer patients and their families through emotional , educational and financial support.  548-724-7859 ? [place] DSS Where to apply for food stamps, Medicaid and utility assistance. 6097140647 ? [employer]: Transportation to medical appointments. 725-496-7874 ? Social Security Administration: May apply for disability if have a Stage IV cancer. 202-647-7690 (224)053-3499 ? [employer], Disability and Transit Services: Assists with nutrition, care and transit needs. [place] Support Programs: @10RELATIVEDAYS @ > Cancer Support Group  2nd Tuesday of the month 1pm-2pm, Journey Room  > Creative Journey  3rd Tuesday of the month 1130am-1pm, Journey Room  > Look Good Feel Better  1st Wednesday of the month 10am-12 noon, Journey Room (Call [place] to register [phone])

## 2016-06-03 NOTE — Patient Instructions (Signed)
[facility] Discharge Instructions for Patients Receiving Chemotherapy   Beginning January 23rd 2017 lab work for the Cancer Center will be done in the  Main lab at [place] on 1st floor. If you have a lab appointment with the Cancer Center please come in thru the  Main Entrance and check in at the main information desk   Today you received the following chemotherapy agents:  Abraxane and Gemzar.  If you develop nausea and vomiting, or diarrhea that is not controlled by your medication, call the clinic.  The clinic phone number is [phone]. Office hours are Monday-Friday 8:30am-5:00pm.  BELOW ARE SYMPTOMS THAT SHOULD BE REPORTED IMMEDIATELY:  *FEVER GREATER THAN 101.0 F  *CHILLS WITH OR WITHOUT FEVER  NAUSEA AND VOMITING THAT IS NOT CONTROLLED WITH YOUR NAUSEA MEDICATION  *UNUSUAL SHORTNESS OF BREATH  *UNUSUAL BRUISING OR BLEEDING  TENDERNESS IN MOUTH AND THROAT WITH OR WITHOUT PRESENCE OF ULCERS  *URINARY PROBLEMS  *BOWEL PROBLEMS  UNUSUAL RASH Items with * indicate a potential emergency and should be followed up as soon as possible. If you have an emergency after office hours please contact your primary care physician or go to the nearest emergency department.  Please call the clinic during office hours if you have any questions or concerns.   You may also contact the Patient Navigator at [phone] should you have any questions or need assistance in obtaining follow up care.      Resources For Cancer Patients and their Caregivers ? American Cancer Society: Can assist with transportation, wigs, general needs, runs Look Good Feel Better.        1-888-227-6333 ? Cancer Care: Provides financial assistance, online support groups, medication/co-pay assistance.  1-800-813-HOPE (4673) ? [facility] Assists [place] cancer patients and their families through emotional , educational and financial support.   336-427-4357 ? [place] DSS Where to apply for food stamps, Medicaid and utility assistance. 336-342-1394 ? [employer]: Transportation to medical appointments. 336-347-2287 ? Social Security Administration: May apply for disability if have a Stage IV cancer. 336-342-7796 1-800-772-1213 ? [place] Aging, Disability and Transit Services: Assists with nutrition, care and transit needs. 336-349-2343         

## 2016-06-03 NOTE — Progress Notes (Signed)
[place]  Progress Note  Patient Care Team: Provider Not In System as PCP - General  CHIEF COMPLAINTS/PURPOSE OF CONSULTATION:  Adenocarcinoma of body/tail of pancreas, T4N0Mx History of TB, treated by [facility] History of MAC Weight Loss History Hyponatremia Tobacco Abuse    Pancreatic cancer ([place])   11/11/2015 - 11/13/2015 Hospital Admission    abdominal pain, cramping, diarrhea, weight loss, hyponatremia CT pancreatic body mass      11/12/2015 Imaging    4.7 cm mass of pancreas to L of midline, involvement of several vascular structures also possibly the distal duodenum      11/21/2015 - 11/25/2015 Hospital Admission    EUS on 5/23, hyponatremia      11/22/2015 Imaging    Diffuse mesenteric edema, large hypo enhancing pancreatic mass, abutment of celiac axis, SMA, encasement of splenic artery, occludes splenic vein, compresses porta splenic confluence      11/25/2015 Procedure    EUS Dr. Ardis Hughs, irreg mass in pancreatic body/tail. 4.2 cm, involvement of major blood vessels not appreciated due to size of mass      11/25/2015 Pathology Results    malignant cells c/w adenocarcinoma      12/04/2015 Miscellaneous    Acid Fast Smear negative      12/04/2015 Imaging    No convincing evidence of pulm mets, improvement in LLL, RUL angular nodular thickening seen on comparison CT from 2013, residual cavitation remains at the L lung base, one nodule oblique fissue on R 19mm new      12/08/2015 Procedure    Port a cath      12/09/2015 -  Chemotherapy    Gemzar/Abraxane days 1, 8, and 15 every 28 days.      12/12/2015 Imaging    MRI abd- Limited MRI, D/C prior to completion at patient request. Large infiltrative 8.1 x 4.7 x 5.8 cm pancreatic mass centered at the junction of the pancreatic body and tail, consistent with known primary pancreatic adenocarc      12/23/2015 Treatment Plan Change    Treatment deferred x 7 days due to thrombocytopenia      01/14/2016 Imaging    CT abd/pelvis- Mass in the body/ tail of the pancreas consistent with known pancreatic carcinoma. Likely metastases to the liver demonstrating some progression since previous study.      02/26/2016 Treatment Plan Change    Day 15 deleted from treatment plan.  Chemotherapy is now days 1, 8 every 28 days.      05/06/2016 Imaging    CT abd/pelvis- 1. Mixed response to therapy. 2. Decrease in size of tumor within the tail of pancreas. 3. Several new liver metastases are identified. Previously noted liver metastasis have resolved or calcified.       HISTORY OF PRESENTING ILLNESS:  Nicholas [name] 65 y.o. male is here for additional follow-up of stage IV pancreatic cancer. He has chronic pulmonary MAC, he is a smoker.   Patient is unaccompanied. He is here for cycle 8 of Abraxane/ Gemcitabine. Nicholas [name] has lost two pounds since his previous visit on 05/20/16. He has not heard from anyone regarding an EGD.   Patient still experiences shooting pains in abdomen. This is causing him a lot of discomfort and leading to poor appetite. He describes it as his "stomach is griping." that is his only complaint.  He continues to smoke.   Patient had experienced a rash, but now it has subsided. He notes that is occurred the day  after his last chemotherapy however he did not call. It was itchy.   MEDICAL HISTORY:  Past Medical History:  Diagnosis Date  . Abdominal pain in male    "was told he has a Mass near pancreas"  . Cancer (HCC)    Skin cancer-ear, skin cancer -leeg,cancer of leg muscle- left., basal cell   . Pancreatic cancer ([place]) 12/02/2015  . Pancreatic mass april 2017  . TB (pulmonary tuberculosis)    3 yrs ago-tx" positive TB test aslso" was told he is not contagious.treatment done for regulat TB.    SURGICAL HISTORY: Past Surgical History:  Procedure Laterality Date  . COLONOSCOPY W/ POLYPECTOMY    . EUS N/A 11/25/2015   Procedure: ESOPHAGEAL ENDOSCOPIC  ULTRASOUND (EUS) RADIAL;  Surgeon: Milus Banister, MD;  Location: [facility];  Service: Endoscopy;  Laterality: N/A;  . LEG SURGERY Left    " cancer removed from muscle"    SOCIAL HISTORY: Social History   Social History  . Marital status: Single    Spouse name: N/A  . Number of children: N/A  . Years of education: N/A   Occupational History  . Not on file.   Social History Main Topics  . Smoking status: Current Every Day Smoker    Packs/day: 0.50    Years: 30.00    Types: Cigarettes  . Smokeless tobacco: Never Used  . Alcohol use No     Comment: 1-2 beers at night - pt states he can't remember the last time he drank any alcohol  . Drug use: No  . Sexual activity: No   Other Topics Concern  . Not on file   Social History Narrative  . No narrative on file   Single. He has been engaged 4 times. No children He has cut his smoking back since he had tuberculosis. He has smoked more with recent pain. Sometimes 1 ppd, "it varies". Since last Thursday, he has had 2 beers. It does not taste right. He has never had acid reflux before, but now he does. He was in the [employer]. Worked in Office manager, [place], and the post office.   FAMILY HISTORY: Family History  Problem Relation Age of Onset  . Stroke Mother   . Pneumonia Father    Mother living 47 years old at [employer] [facility] and rehabilitation center.  Father deceased at 67 years old of pneumonia. He drank and drank a lot. [employer] English as a second language teacher. Had a massive heart attack at 21 yo 1 sister, lives in [place]:  has No Known Allergies.  MEDICATIONS:  Current Outpatient Prescriptions  Medication Sig Dispense Refill  . aspirin EC 81 MG tablet Take 81 mg by mouth daily.    . feeding supplement, ENSURE ENLIVE, (ENSURE ENLIVE) LIQD Take 237 mLs by mouth 2 (two) times daily between meals. 60 Bottle 0  . Gemcitabine HCl (GEMZAR IV) Inject into the vein. To begin June 6    . hydrocerin (EUCERIN) CREA Apply 1  application topically 2 (two) times daily as needed (For dry skin.).    Marland Kitchen lidocaine-prilocaine (EMLA) cream Apply a quarter size amount to port site 1 hour prior to chemo. Do not rub in. Cover with plastic wrap. 30 g 3  . lipase/protease/amylase (CREON) 36000 UNITS CPEP capsule Take 1 capsule (36,000 Units total) by mouth 3 (three) times daily before meals. 180 capsule 3  . Menthol-Methyl Salicylate (THERA-GESIC) 1-15 % CREA Apply 1 application topically 4 (four) times daily as needed (For pain.).    Marland Kitchen  mirtazapine (REMERON) 15 MG tablet Take on tablet po qhs for 5 days then increase to two tablets po qhs 60 tablet 3  . Multiple Vitamins-Minerals (MULTIVITAMINS THER. W/MINERALS) TABS tablet Take 1 tablet by mouth daily. Reported on 12/09/2015    . ondansetron (ZOFRAN) 8 MG tablet Take 1 tablet (8 mg total) by mouth every 8 (eight) hours as needed for nausea or vomiting. 30 tablet 2  . oxyCODONE (OXYCONTIN) 15 mg 12 hr tablet Take 1 tablet (15 mg total) by mouth every 8 (eight) hours. 90 tablet 0  . Oxycodone HCl 10 MG TABS Take 1 tablet (10 mg total) by mouth every 4 (four) hours as needed. Ok to fill on 05/21/2016, dose change 120 tablet 0  . PACLitaxel Protein-Bound Part (ABRAXANE IV) Inject into the vein. To begin June 6    . polyethylene glycol (GOLYTELY) 236 g solution Take 4,000 mLs by mouth once. 4000 mL 0  . polyethylene glycol (MIRALAX / GLYCOLAX) packet Take 17 g by mouth daily. (Patient taking differently: Take 17 g by mouth daily as needed for mild constipation. ) 14 each 0  . prochlorperazine (COMPAZINE) 10 MG tablet Take 1 tablet (10 mg total) by mouth every 6 (six) hours as needed for nausea or vomiting. 30 tablet 2  . senna-docusate (SENOKOT S) 8.6-50 MG tablet Take 2 tablets by mouth daily.    . sildenafil (VIAGRA) 100 MG tablet Take 0.5 tablets (50 mg total) by mouth daily as needed for erectile dysfunction. 10 tablet 0   No current facility-administered medications for this visit.      Review of Systems  Constitutional: Negative.        Poor appetite  HENT: Negative.   Eyes: Negative.   Respiratory: Positive for shortness of breath.        Chronic SOB  Gastrointestinal: Positive for abdominal pain.       Sharp pains on side of abdomin.   Genitourinary: Negative.   Musculoskeletal: Negative.   Skin: Negative.   Neurological: Negative.   Endo/Heme/Allergies: Negative.   Psychiatric/Behavioral: Negative.   All other systems reviewed and are negative. 14 point ROS was done and is otherwise as detailed above or in HPI  PHYSICAL EXAMINATION: ECOG PERFORMANCE STATUS: 1 - Symptomatic but completely ambulatory  Vitals with BMI 06/03/2016  Height   Weight 111 lbs 6 oz  BMI   Systolic [id number]  Diastolic 82  Pulse [id number]  Respirations 20   Physical Exam  Constitutional: He is oriented to person, place, and time and well-developed, well-nourished, and in no distress.  Patient is very thin.   HENT:  Head: Normocephalic and atraumatic.  Mouth/Throat: Oropharynx is clear and moist.  Eyes: Conjunctivae and EOM are normal. Pupils are equal, round, and reactive to light. Right eye exhibits no discharge. Left eye exhibits no discharge. No scleral icterus.  Neck: Normal range of motion. Neck supple. No JVD present. No tracheal deviation present. No thyromegaly present.  Cardiovascular: Normal rate, regular rhythm and normal heart sounds.  Exam reveals no gallop and no friction rub.   No murmur heard. Pulmonary/Chest: Effort normal and breath sounds normal. No stridor.  Abdominal: Soft. He exhibits no distension and no mass. There is no tenderness. There is no rebound and no guarding.  Hyperactive bowel sounds  Musculoskeletal: Normal range of motion. He exhibits no edema or tenderness.  Lymphadenopathy:    He has no cervical adenopathy.  Neurological: He is alert and oriented to person, place, and time. Gait normal.  Skin: Skin is warm and dry.  Psychiatric: Mood, memory,  affect and judgment normal.  Nursing note and vitals reviewed.   LABORATORY DATA:  I have reviewed the data as listed Lab Results  Component Value Date   WBC 4.9 05/20/2016   HGB 11.6 (L) 05/20/2016   HCT 33.6 (L) 05/20/2016   MCV 101.5 (H) 05/20/2016   PLT 365 05/20/2016   CMP     Component Value Date/Time   NA 130 (L) 05/20/2016 1054   K 4.0 05/20/2016 1054   CL 96 (L) 05/20/2016 1054   CO2 27 05/20/2016 1054   GLUCOSE 158 (H) 05/20/2016 1054   BUN 7 05/20/2016 1054   CREATININE 0.47 (L) 05/20/2016 1054   CALCIUM 9.2 05/20/2016 1054   PROT 7.3 05/20/2016 1054   ALBUMIN 3.8 05/20/2016 1054   AST 48 (H) 05/20/2016 1054   ALT 45 05/20/2016 1054   ALKPHOS 78 05/20/2016 1054   BILITOT 0.5 05/20/2016 1054   GFRNONAA >60 05/20/2016 1054   GFRAA >60 05/20/2016 1054    Results for SEMAJ, TEEMS B (MRN [id number]) as of 06/06/2016 17:08  Ref. Range 03/25/2016 09:51 04/01/2016 10:58 04/22/2016 11:16 05/14/2016 10:41 06/03/2016 09:22  CA 19-9 Latest Ref Range: 0 - 35 U/mL 300 (H) 228 (H) 149 (H) 141 (H) 126 (H)    Results for Nicholas [name], Nicholas [name] (MRN [id number]) as of 06/06/2016 17:08  Ref. Range 05/14/2016 12:36  Folate Latest Ref Range: >5.9 ng/mL 53.1  Vitamin B12 Latest Ref Range: 180 - 914 pg/mL 1,086 (H)     PATHOLOGY:     RADIOGRAPHIC STUDIES: I have personally reviewed the radiological images as listed and agreed with the findings in the report.  Study Result   CLINICAL DATA:  Restaging pancreas cancer  EXAM: CT ABDOMEN AND PELVIS WITH CONTRAST  TECHNIQUE: Multidetector CT imaging of the abdomen and pelvis was performed using the standard protocol following bolus administration of intravenous contrast.  CONTRAST:  60mL ISOVUE-300 IOPAMIDOL (ISOVUE-300) INJECTION 61%, 163mL ISOVUE-300 IOPAMIDOL (ISOVUE-300) INJECTION 61%  COMPARISON:  01/14/2016  FINDINGS: Lower chest: No acute abnormality.  Hepatobiliary: Within the posterior right lobe of  liver there is a 11 mm low-attenuation lesion, 28 of series 2. New from previous exam. Treated lesion within segment 5 of the liver measures 3 mm in appears calcified. Previously 10 mm and noncalcified. New lesion within the lateral right lobe of liver measures 1.5 cm, image 16 of series 2. Also new from previous exam is a 8 mm low-attenuation structure, image 14 of series 2. 5 mm lesion within the medial segment of left lobe of liver has resolved in the interval. The gallbladder is normal. No biliary dilatation.  Pancreas: Tumor involving the tail of pancreas measures 4.7 x 1.9 cm, image 20 of series 2. Previously 6.7 x 3.0 cm.  Spleen: Normal in size without focal abnormality.  Adrenals/Urinary Tract: Normal appearance of the right adrenal gland. Enlargement of the left adrenal gland appears similar to the previous exam, image 15 of series 2. The kidneys are unremarkable. There is mild ventral bladder wall thickening.  Stomach/Bowel: Stomach is within normal limits. Appendix appears normal. No evidence of bowel wall thickening, distention, or inflammatory changes.  Vascular/Lymphatic: Aortic atherosclerosis. No aneurysm. No upper abdominal adenopathy. There is no pelvic or inguinal adenopathy.  Reproductive: Prostate is unremarkable.  Other: A small amount of ascites is identified within the dependent portion of the pelvis. Decreased perihepatic ascites.  Musculoskeletal: Degenerative disc disease noted within the lumbar spine.  No aggressive lytic or sclerotic bone lesions.  IMPRESSION: 1. Mixed response to therapy. 2. Decrease in size of tumor within the tail of pancreas. 3. Several new liver metastases are identified. Previously noted liver metastasis have resolved or calcified.   Electronically Signed   By: Kerby Moors M.D.   On: 05/05/2016 15:54        EUS:        ASSESSMENT & PLAN:  Adenocarcinoma of body/tail of pancreas, T4N0M1, Stage  IV disease History of TB, treated by [facility] MAC Weight Loss History Hyponatremia Tobacco Abuse Cancer related pain Hypokalemia Hyperglycemia Macrocytosis End of life discussion/goals of care  He presents for ongoing Abraxane/Gemzar. Ca 19-9 is declining. CT imaging showed positive response to therapy of all initial disease.Several new lesions were noted in the liver, I am not convinced that this is pancreatic cancer and have discussed with the patient that based upon his next imaging may need to consider biopsy. He is afebrile no wbc count but history of MAC noted.    End of life issues were addressed, he continues to avoid them. I did tell him he will not be able to return to work. He seemed to expect this. He still remains full code.   He wil be due for repeat imaging in January.   Patient's rash was likely caused by gemcitabine. I encouraged patient to notify me if he ever experiences a rash in the future.  I will have a referral for EGD before he leaves today.  Continue with ongoing follow-up D1 of each cycle. I will follow-up with him post CT scans for further recommendations.  Orders Placed This Encounter  Procedures  . CT Abdomen Pelvis W Contrast    Standing Status:   Future    Standing Expiration Date:   06/03/2017    Order Specific Question:   If indicated for the ordered procedure, I authorize the administration of contrast media per Radiology protocol    Answer:   Yes    Order Specific Question:   Reason for Exam (SYMPTOM  OR DIAGNOSIS REQUIRED)    Answer:   restaging stage IV pancreatic cancer    Order Specific Question:   Preferred imaging location?    Answer:   [facility]  . CT Chest W Contrast    Standing Status:   Future    Standing Expiration Date:   06/03/2017    Order Specific Question:   If indicated for the ordered procedure, I authorize the administration of contrast media per Radiology protocol    Answer:   Yes    Order Specific  Question:   Reason for Exam (SYMPTOM  OR DIAGNOSIS REQUIRED)    Answer:   restaging stage IV NSCLC, MAC    Order Specific Question:   Preferred imaging location?    Answer:   [facility]  . CBC with Differential    Standing Status:   Future    Standing Expiration Date:   06/03/2017  . Comprehensive metabolic panel    Standing Status:   Future    Standing Expiration Date:   06/03/2017  . Cancer antigen 19-9    Standing Status:   Future    Standing Expiration Date:   06/03/2017     All questions were answered. The patient knows to call the clinic with any problems, questions or concerns.  This document serves as a record of services personally performed by Ancil Linsey, MD. It was created on her  behalf by Elmyra Ricks, a trained medical scribe. The creation of this record is based on the scribe's personal observations and the provider's statements to them. This document has been checked and approved by the attending provider.  I have reviewed the above documentation for accuracy and completeness, and I agree with the above.   This note was electronically signed.    Molli Hazard, MD  06/03/2016 8:19 AM

## 2016-06-04 ENCOUNTER — Ambulatory Visit (INDEPENDENT_AMBULATORY_CARE_PROVIDER_SITE_OTHER): Payer: Medicaid Other | Admitting: Internal Medicine

## 2016-06-04 ENCOUNTER — Ambulatory Visit (HOSPITAL_COMMUNITY): Payer: Medicaid Other

## 2016-06-04 ENCOUNTER — Ambulatory Visit (HOSPITAL_COMMUNITY): Payer: Medicaid Other | Admitting: Hematology & Oncology

## 2016-06-04 LAB — CANCER ANTIGEN 19-9: CA 19-9: 126 U/mL — ABNORMAL HIGH (ref 0–35)

## 2016-06-06 ENCOUNTER — Encounter (HOSPITAL_COMMUNITY): Payer: Self-pay | Admitting: Hematology & Oncology

## 2016-06-08 ENCOUNTER — Encounter (INDEPENDENT_AMBULATORY_CARE_PROVIDER_SITE_OTHER): Payer: Self-pay | Admitting: Internal Medicine

## 2016-06-08 ENCOUNTER — Ambulatory Visit (INDEPENDENT_AMBULATORY_CARE_PROVIDER_SITE_OTHER): Payer: Medicaid Other | Admitting: Internal Medicine

## 2016-06-08 ENCOUNTER — Encounter (INDEPENDENT_AMBULATORY_CARE_PROVIDER_SITE_OTHER): Payer: Self-pay | Admitting: *Deleted

## 2016-06-08 ENCOUNTER — Other Ambulatory Visit (INDEPENDENT_AMBULATORY_CARE_PROVIDER_SITE_OTHER): Payer: Self-pay | Admitting: Internal Medicine

## 2016-06-08 VITALS — BP 128/65 | HR 88 | Temp 97.3°F | Ht 71.0 in | Wt 113.5 lb

## 2016-06-08 DIAGNOSIS — R1013 Epigastric pain: Secondary | ICD-10-CM | POA: Insufficient documentation

## 2016-06-08 DIAGNOSIS — C251 Malignant neoplasm of body of pancreas: Secondary | ICD-10-CM

## 2016-06-08 DIAGNOSIS — K219 Gastro-esophageal reflux disease without esophagitis: Secondary | ICD-10-CM

## 2016-06-08 NOTE — Patient Instructions (Signed)
EGD. The risks and benefits such as perforation, bleeding, and infection were reviewed with the patient and is agreeable. 

## 2016-06-08 NOTE — Progress Notes (Signed)
Subjective:    Patient ID: Nicholas [name], male    DOB: 11-03-1950, 65 y.o.   MRN: [id number]  HPI  Referred by Dr. Whitney Muse for nausea/EGD. He tells me if feels like someone has kicked him in his stomach. Stomach feels grippy. He has some epigastric tenderness.  Sometimes the pain is worse after chemo.  He has a BM daily. He takes Miralax as needed for constipation.  His appetite is okay. He has had weight loss since his diagnosis of pancreatitic cancer in May of this year.  He has nausea after chemo.  He has a BM daily.  Normal LFTs.  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   CMP     Component Value Date/Time   NA 132 (L) 06/03/2016 0922   K 4.1 06/03/2016 0922   CL 98 (L) 06/03/2016 0922   CO2 27 06/03/2016 0922   GLUCOSE 175 (H) 06/03/2016 0922   BUN 6 06/03/2016 0922   CREATININE 0.52 (L) 06/03/2016 0922   CALCIUM 9.1 06/03/2016 0922   PROT 6.8 06/03/2016 0922   ALBUMIN 3.6 06/03/2016 0922   AST 35 06/03/2016 0922   ALT 30 06/03/2016 0922   ALKPHOS 82 06/03/2016 0922   BILITOT <0.1 (L) 06/03/2016 0922   GFRNONAA >60 06/03/2016 0922   GFRAA >60 06/03/2016 0922        12/09/2015 -  Chemotherapy    Gemzar/Abraxane days 1, 8, and 15 and has a week off.      11/25/2015: Normal. EUS 11/25/2015 An irregular mass was identified in the pancreatic body/tail. Biopsy: adenocarcinoma (Dr. Ardis Hughs).  05/05/2016 CT abdomen/pelvis with CM; restaging pancreas cancer:  IMPRESSION: 1. Mixed response to therapy. 2. Decrease in size of tumor within the tail of pancreas. 3. Several new liver metastases are identified. Previously noted liver metastasis have resolved or calcified.  CBC    Component Value Date/Time   WBC 7.3 06/03/2016 0922   RBC 3.16 (L) 06/03/2016 0922   HGB 11.2 (L) 06/03/2016 0922   HCT 32.2 (L) 06/03/2016 0922   PLT 245 06/03/2016 0922   MCV 101.9 (H) 06/03/2016 0922   MCH 35.4 (H) 06/03/2016 0922   MCHC 34.8 06/03/2016 0922   RDW 16.9 (H) 06/03/2016  0922   LYMPHSABS 1.1 06/03/2016 0922   MONOABS 1.0 06/03/2016 0922   EOSABS 0.1 06/03/2016 0922   BASOSABS 0.0 06/03/2016 0922     Review of Systems Past Medical History:  Diagnosis Date  . Abdominal pain in male    "was told he has a Mass near pancreas"  . Cancer (HCC)    Skin cancer-ear, skin cancer -leeg,cancer of leg muscle- left., basal cell   . Pancreatic cancer ([place]) 12/02/2015  . Pancreatic mass april 2017  . TB (pulmonary tuberculosis)    3 yrs ago-tx" positive TB test aslso" was told he is not contagious.treatment done for regulat TB.    Past Surgical History:  Procedure Laterality Date  . COLONOSCOPY W/ POLYPECTOMY    . EUS N/A 11/25/2015   Procedure: ESOPHAGEAL ENDOSCOPIC ULTRASOUND (EUS) RADIAL;  Surgeon: Milus Banister, MD;  Location: [facility];  Service: Endoscopy;  Laterality: N/A;  . LEG SURGERY Left    " cancer removed from muscle"    No Known Allergies  Current Outpatient Prescriptions on File Prior to Visit  Medication Sig Dispense Refill  . aspirin EC 81 MG tablet Take 81 mg by mouth daily.    . feeding supplement, ENSURE ENLIVE, (ENSURE ENLIVE) LIQD Take 237  mLs by mouth 2 (two) times daily between meals. 60 Bottle 0  . Gemcitabine HCl (GEMZAR IV) Inject into the vein. To begin June 6    . hydrocerin (EUCERIN) CREA Apply 1 application topically 2 (two) times daily as needed (For dry skin.).    Marland Kitchen lidocaine-prilocaine (EMLA) cream Apply a quarter size amount to port site 1 hour prior to chemo. Do not rub in. Cover with plastic wrap. 30 g 3  . lipase/protease/amylase (CREON) 36000 UNITS CPEP capsule Take 1 capsule (36,000 Units total) by mouth 3 (three) times daily before meals. 180 capsule 3  . Multiple Vitamins-Minerals (MULTIVITAMINS THER. W/MINERALS) TABS tablet Take 1 tablet by mouth daily. Reported on 12/09/2015    . oxyCODONE (OXYCONTIN) 15 mg 12 hr tablet Take 1 tablet (15 mg total) by mouth every 8 (eight) hours. 90 tablet 0  . Oxycodone HCl 10 MG  TABS Take 1 tablet (10 mg total) by mouth every 4 (four) hours as needed. Ok to fill on 05/21/2016, dose change 120 tablet 0  . PACLitaxel Protein-Bound Part (ABRAXANE IV) Inject into the vein. To begin June 6    . polyethylene glycol (GOLYTELY) 236 g solution Take 4,000 mLs by mouth once. 4000 mL 0  . senna-docusate (SENOKOT S) 8.6-50 MG tablet Take 2 tablets by mouth daily.     No current facility-administered medications on file prior to visit.        Objective:   Physical Exam Blood pressure 128/65, pulse 88, temperature 97.3 F (36.3 C), height 5\' 11"  (1.803 m), weight 113 lb 8 oz (51.5 kg).  Alert and oriented. Skin warm and dry. Oral mucosa is moist.   . Sclera anicteric, conjunctivae is pink. Thyroid not enlarged. No cervical lymphadenopathy. Lungs clear. Heart regular rate and rhythm.  Abdomen is soft. Bowel sounds are positive. No hepatomegaly. No abdominal masses felt. No tenderness.  No edema to lower extremities.      Assessment & Plan:  Epigastric pain. Hx of pancreatic cancer with chemo at present. ``LFTs are normal.  EGD. I discussed with Dr.Rehman.  New mets to the liver.

## 2016-06-10 ENCOUNTER — Encounter (HOSPITAL_COMMUNITY): Payer: Medicaid Other | Attending: Hematology & Oncology

## 2016-06-10 VITALS — BP 130/66 | HR 92 | Temp 98.2°F | Resp 18 | Wt 111.6 lb

## 2016-06-10 DIAGNOSIS — K5900 Constipation, unspecified: Secondary | ICD-10-CM | POA: Insufficient documentation

## 2016-06-10 DIAGNOSIS — C787 Secondary malignant neoplasm of liver and intrahepatic bile duct: Secondary | ICD-10-CM | POA: Diagnosis not present

## 2016-06-10 DIAGNOSIS — K869 Disease of pancreas, unspecified: Secondary | ICD-10-CM | POA: Insufficient documentation

## 2016-06-10 DIAGNOSIS — K7689 Other specified diseases of liver: Secondary | ICD-10-CM | POA: Insufficient documentation

## 2016-06-10 DIAGNOSIS — Z8611 Personal history of tuberculosis: Secondary | ICD-10-CM | POA: Diagnosis present

## 2016-06-10 DIAGNOSIS — G893 Neoplasm related pain (acute) (chronic): Secondary | ICD-10-CM

## 2016-06-10 DIAGNOSIS — R739 Hyperglycemia, unspecified: Secondary | ICD-10-CM

## 2016-06-10 DIAGNOSIS — C251 Malignant neoplasm of body of pancreas: Secondary | ICD-10-CM

## 2016-06-10 DIAGNOSIS — R143 Flatulence: Secondary | ICD-10-CM | POA: Diagnosis present

## 2016-06-10 DIAGNOSIS — C258 Malignant neoplasm of overlapping sites of pancreas: Secondary | ICD-10-CM | POA: Diagnosis present

## 2016-06-10 DIAGNOSIS — Z5111 Encounter for antineoplastic chemotherapy: Secondary | ICD-10-CM

## 2016-06-10 DIAGNOSIS — E876 Hypokalemia: Secondary | ICD-10-CM | POA: Diagnosis present

## 2016-06-10 LAB — CBC WITH DIFFERENTIAL/PLATELET
Basophils Absolute: 0.1 K/uL (ref 0.0–0.1)
Basophils Relative: 1 %
Eosinophils Absolute: 0.1 K/uL (ref 0.0–0.7)
Eosinophils Relative: 1 %
HCT: 32.5 % — ABNORMAL LOW (ref 39.0–52.0)
Hemoglobin: 11.5 g/dL — ABNORMAL LOW (ref 13.0–17.0)
Lymphocytes Relative: 32 %
Lymphs Abs: 1.4 K/uL (ref 0.7–4.0)
MCH: 35.9 pg — ABNORMAL HIGH (ref 26.0–34.0)
MCHC: 35.4 g/dL (ref 30.0–36.0)
MCV: 101.6 fL — ABNORMAL HIGH (ref 78.0–100.0)
Monocytes Absolute: 0.4 K/uL (ref 0.1–1.0)
Monocytes Relative: 8 %
Neutro Abs: 2.6 K/uL (ref 1.7–7.7)
Neutrophils Relative %: 58 %
Platelets: 359 K/uL (ref 150–400)
RBC: 3.2 MIL/uL — ABNORMAL LOW (ref 4.22–5.81)
RDW: 16.6 % — ABNORMAL HIGH (ref 11.5–15.5)
WBC: 4.4 K/uL (ref 4.0–10.5)

## 2016-06-10 LAB — COMPREHENSIVE METABOLIC PANEL
ALBUMIN: 3.8 g/dL (ref 3.5–5.0)
ALT: 61 U/L (ref 17–63)
ANION GAP: 7 (ref 5–15)
AST: 56 U/L — ABNORMAL HIGH (ref 15–41)
Alkaline Phosphatase: 90 U/L (ref 38–126)
BILIRUBIN TOTAL: 0.5 mg/dL (ref 0.3–1.2)
BUN: 7 mg/dL (ref 6–20)
CHLORIDE: 95 mmol/L — AB (ref 101–111)
CO2: 28 mmol/L (ref 22–32)
Calcium: 9.3 mg/dL (ref 8.9–10.3)
Creatinine, Ser: 0.59 mg/dL — ABNORMAL LOW (ref 0.61–1.24)
GFR calc Af Amer: >60 mL/min (ref >60)
GLUCOSE: 220 mg/dL — AB (ref 65–99)
POTASSIUM: 4 mmol/L (ref 3.5–5.1)
Sodium: 130 mmol/L — ABNORMAL LOW (ref 135–145)
TOTAL PROTEIN: 7.6 g/dL (ref 6.5–8.1)

## 2016-06-10 LAB — GLUCOSE, RANDOM: Glucose, Bld: 144 mg/dL — ABNORMAL HIGH (ref 65–99)

## 2016-06-10 MED ORDER — INSULIN ASPART 100 UNIT/ML ~~LOC~~ SOLN
2.0000 [IU] | Freq: Once | SUBCUTANEOUS | Status: AC
  Administered 2016-06-10: 2 [IU] via SUBCUTANEOUS
  Filled 2016-06-10: qty 0.02

## 2016-06-10 MED ORDER — METFORMIN HCL 500 MG PO TABS: 500.0000 mg | ORAL_TABLET | Freq: Every day | ORAL | 1 refills | Status: AC

## 2016-06-10 MED ORDER — OXYCODONE HCL 10 MG PO TABS: 10.0000 mg | ORAL_TABLET | ORAL | 0 refills | Status: DC | PRN

## 2016-06-10 MED ORDER — ONDANSETRON 8 MG PO TBDP
8.0000 mg | ORAL_TABLET | Freq: Once | ORAL | Status: AC
Start: 2016-06-10 — End: 2016-06-10
  Administered 2016-06-10: 8 mg via ORAL
  Filled 2016-06-10: qty 1

## 2016-06-10 MED ORDER — SODIUM CHLORIDE 0.9% FLUSH
10.0000 mL | INTRAVENOUS | Status: DC | PRN
  Administered 2016-06-10: 10 mL
  Filled 2016-06-10: qty 10

## 2016-06-10 MED ORDER — PACLITAXEL PROTEIN-BOUND CHEMO INJECTION 100 MG
125.0000 mg/m2 | Freq: Once | INTRAVENOUS | Status: AC
  Administered 2016-06-10: 200 mg via INTRAVENOUS
  Filled 2016-06-10: qty 40

## 2016-06-10 MED ORDER — INSULIN ASPART 100 UNIT/ML ~~LOC~~ SOLN
2.0000 [IU] | Freq: Once | SUBCUTANEOUS | Status: DC
  Filled 2016-06-10: qty 0.02

## 2016-06-10 MED ORDER — SODIUM CHLORIDE 0.9 % IV SOLN
990.0000 mg/m2 | Freq: Once | INTRAVENOUS | Status: AC
  Administered 2016-06-10: 1596 mg via INTRAVENOUS
  Filled 2016-06-10: qty 15.78

## 2016-06-10 MED ORDER — SODIUM CHLORIDE 0.9 % IV SOLN
Freq: Once | INTRAVENOUS | Status: AC
  Administered 2016-06-10: 12:00:00 via INTRAVENOUS

## 2016-06-10 MED ORDER — HEPARIN SOD (PORK) LOCK FLUSH 100 UNIT/ML IV SOLN
500.0000 [IU] | Freq: Once | INTRAVENOUS | Status: AC | PRN
  Administered 2016-06-10: 500 [IU]
  Filled 2016-06-10: qty 5

## 2016-06-10 NOTE — Patient Instructions (Signed)
[facility] Discharge Instructions for Patients Receiving Chemotherapy   Beginning January 23rd 2017 lab work for the [facility] will be done in the  Main lab at [facility] on 1st floor. If you have a lab appointment with the [facility] please come in thru the  Main Entrance and check in at the main information desk   Today you received the following chemotherapy agents Abraxane and Gemzar. Follow-up as scheduled. Call clinic for any questions or cocnerns  To help prevent nausea and vomiting after your treatment, we encourage you to take your nausea medication   If you develop nausea and vomiting, or diarrhea that is not controlled by your medication, call the clinic.  The clinic phone number is [phone]. Office hours are Monday-Friday 8:30am-5:00pm.  BELOW ARE SYMPTOMS THAT SHOULD BE REPORTED IMMEDIATELY:  *FEVER GREATER THAN 101.0 F  *CHILLS WITH OR WITHOUT FEVER  NAUSEA AND VOMITING THAT IS NOT CONTROLLED WITH YOUR NAUSEA MEDICATION  *UNUSUAL SHORTNESS OF BREATH  *UNUSUAL BRUISING OR BLEEDING  TENDERNESS IN MOUTH AND THROAT WITH OR WITHOUT PRESENCE OF ULCERS  *URINARY PROBLEMS  *BOWEL PROBLEMS  UNUSUAL RASH Items with * indicate a potential emergency and should be followed up as soon as possible. If you have an emergency after office hours please contact your primary care physician or go to the nearest emergency department.  Please call the clinic during office hours if you have any questions or concerns.   You may also contact the Patient Navigator at [phone] should you have any questions or need assistance in obtaining follow up care.      Resources For Cancer Patients and their Caregivers ? American Cancer Society: Can assist with transportation, wigs, general needs, runs Look Good Feel Better.        (616)678-7113 ? Cancer Care: Provides financial assistance, online support groups, medication/co-pay assistance.   1-800-813-HOPE 248-270-2917) ? [place] Assists [place] cancer patients and their families through emotional , educational and financial support.  (714)177-0869 ? [place] DSS Where to apply for food stamps, Medicaid and utility assistance. 973-088-7407 ? [facility]: Transportation to medical appointments. (303)092-9288 ? Social Security Administration: May apply for disability if have a Stage IV cancer. 708-412-3572 220-159-5359 ? [employer], Disability and Transit Services: Assists with nutrition, care and transit needs. 714-704-8494

## 2016-06-10 NOTE — Progress Notes (Signed)
Nicholas [name] tolerated Chemo tx well without complaints or incident. Labs reviewed including Glucose of 220 with Kirby Crigler PA prior to administering chemotherapy. Orders obtained to give 2 units of Novolog insulin SQ and recheck Glucose in 30 minutes per PA. Glucose was 144 30 minutes after insulin given. Dr. Whitney Muse was notified and orders obtained for Glucophage 500 mg PO daily per MD which was e-scribed to [facility]. Pt was instructed in this new medication and how to take it and verbalized understanding VSS upon discharge. Pt discharged self ambulatory in satisfactory condition

## 2016-06-11 ENCOUNTER — Ambulatory Visit (HOSPITAL_COMMUNITY): Payer: Medicaid Other

## 2016-06-23 ENCOUNTER — Telehealth (HOSPITAL_COMMUNITY): Payer: Self-pay | Admitting: *Deleted

## 2016-06-23 ENCOUNTER — Other Ambulatory Visit (HOSPITAL_COMMUNITY): Payer: Self-pay | Admitting: Oncology

## 2016-06-23 DIAGNOSIS — C251 Malignant neoplasm of body of pancreas: Secondary | ICD-10-CM

## 2016-06-23 DIAGNOSIS — G893 Neoplasm related pain (acute) (chronic): Secondary | ICD-10-CM

## 2016-06-23 MED ORDER — OXYCODONE HCL ER 15 MG PO T12A: 15.0000 mg | EXTENDED_RELEASE_TABLET | Freq: Three times a day (TID) | ORAL | 0 refills | Status: DC

## 2016-06-23 MED ORDER — OXYCODONE HCL 10 MG PO TABS: 10.0000 mg | ORAL_TABLET | ORAL | 0 refills | Status: DC | PRN

## 2016-06-23 NOTE — Telephone Encounter (Signed)
printed

## 2016-06-24 ENCOUNTER — Ambulatory Visit (HOSPITAL_COMMUNITY): Payer: Medicaid Other

## 2016-06-24 ENCOUNTER — Ambulatory Visit (HOSPITAL_COMMUNITY): Payer: Medicaid Other | Admitting: Hematology & Oncology

## 2016-06-25 ENCOUNTER — Encounter (HOSPITAL_COMMUNITY): Payer: Self-pay | Admitting: Hematology & Oncology

## 2016-07-01 ENCOUNTER — Encounter (HOSPITAL_BASED_OUTPATIENT_CLINIC_OR_DEPARTMENT_OTHER): Payer: Medicaid Other

## 2016-07-01 ENCOUNTER — Encounter (HOSPITAL_COMMUNITY): Payer: Self-pay

## 2016-07-01 VITALS — BP 140/63 | HR 77 | Temp 98.0°F | Resp 18 | Wt 114.8 lb

## 2016-07-01 DIAGNOSIS — K869 Disease of pancreas, unspecified: Secondary | ICD-10-CM | POA: Diagnosis not present

## 2016-07-01 DIAGNOSIS — C787 Secondary malignant neoplasm of liver and intrahepatic bile duct: Secondary | ICD-10-CM

## 2016-07-01 DIAGNOSIS — Z5111 Encounter for antineoplastic chemotherapy: Secondary | ICD-10-CM | POA: Diagnosis present

## 2016-07-01 DIAGNOSIS — C258 Malignant neoplasm of overlapping sites of pancreas: Secondary | ICD-10-CM

## 2016-07-01 DIAGNOSIS — C251 Malignant neoplasm of body of pancreas: Secondary | ICD-10-CM

## 2016-07-01 LAB — COMPREHENSIVE METABOLIC PANEL
ALT: 27 U/L (ref 17–63)
AST: 33 U/L (ref 15–41)
Albumin: 4.2 g/dL (ref 3.5–5.0)
Alkaline Phosphatase: 140 U/L — ABNORMAL HIGH (ref 38–126)
Anion gap: 8 (ref 5–15)
BUN: 7 mg/dL (ref 6–20)
CHLORIDE: 93 mmol/L — AB (ref 101–111)
CO2: 29 mmol/L (ref 22–32)
Calcium: 9.2 mg/dL (ref 8.9–10.3)
Creatinine, Ser: 0.58 mg/dL — ABNORMAL LOW (ref 0.61–1.24)
Glucose, Bld: 114 mg/dL — ABNORMAL HIGH (ref 65–99)
POTASSIUM: 4 mmol/L (ref 3.5–5.1)
SODIUM: 130 mmol/L — AB (ref 135–145)
Total Bilirubin: 0.5 mg/dL (ref 0.3–1.2)
Total Protein: 7.7 g/dL (ref 6.5–8.1)

## 2016-07-01 LAB — CBC WITH DIFFERENTIAL/PLATELET
BASOS ABS: 0 10*3/uL (ref 0.0–0.1)
BASOS PCT: 0 %
EOS ABS: 0.4 10*3/uL (ref 0.0–0.7)
Eosinophils Relative: 3 %
HEMATOCRIT: 37.5 % — AB (ref 39.0–52.0)
HEMOGLOBIN: 12.8 g/dL — AB (ref 13.0–17.0)
LYMPHS PCT: 17 %
Lymphs Abs: 2.1 10*3/uL (ref 0.7–4.0)
MCH: 35.2 pg — ABNORMAL HIGH (ref 26.0–34.0)
MCHC: 34.1 g/dL (ref 30.0–36.0)
MCV: 103 fL — ABNORMAL HIGH (ref 78.0–100.0)
MONOS PCT: 14 %
Monocytes Absolute: 1.7 10*3/uL — ABNORMAL HIGH (ref 0.1–1.0)
NEUTROS ABS: 8 10*3/uL — AB (ref 1.7–7.7)
NEUTROS PCT: 66 %
Platelets: 505 10*3/uL — ABNORMAL HIGH (ref 150–400)
RBC: 3.64 MIL/uL — ABNORMAL LOW (ref 4.22–5.81)
RDW: 17.5 % — ABNORMAL HIGH (ref 11.5–15.5)
WBC: 12.2 10*3/uL — ABNORMAL HIGH (ref 4.0–10.5)

## 2016-07-01 MED ORDER — SODIUM CHLORIDE 0.9% FLUSH
10.0000 mL | INTRAVENOUS | Status: DC | PRN
  Administered 2016-07-01: 10 mL
  Filled 2016-07-01: qty 10

## 2016-07-01 MED ORDER — SODIUM CHLORIDE 0.9 % IV SOLN
990.0000 mg/m2 | Freq: Once | INTRAVENOUS | Status: AC
  Administered 2016-07-01: 1596 mg via INTRAVENOUS
  Filled 2016-07-01: qty 26.2

## 2016-07-01 MED ORDER — PACLITAXEL PROTEIN-BOUND CHEMO INJECTION 100 MG
125.0000 mg/m2 | Freq: Once | INTRAVENOUS | Status: AC
  Administered 2016-07-01: 200 mg via INTRAVENOUS
  Filled 2016-07-01: qty 40

## 2016-07-01 MED ORDER — SODIUM CHLORIDE 0.9 % IV SOLN
Freq: Once | INTRAVENOUS | Status: AC
  Administered 2016-07-01: 11:00:00 via INTRAVENOUS

## 2016-07-01 MED ORDER — ONDANSETRON 8 MG PO TBDP
ORAL_TABLET | ORAL | Status: AC
  Filled 2016-07-01: qty 1

## 2016-07-01 MED ORDER — HEPARIN SOD (PORK) LOCK FLUSH 100 UNIT/ML IV SOLN
500.0000 [IU] | Freq: Once | INTRAVENOUS | Status: AC | PRN
  Administered 2016-07-01: 500 [IU]
  Filled 2016-07-01: qty 5

## 2016-07-01 MED ORDER — ONDANSETRON 8 MG PO TBDP
8.0000 mg | ORAL_TABLET | Freq: Once | ORAL | Status: AC
Start: 2016-07-01 — End: 2016-07-01
  Administered 2016-07-01: 8 mg via ORAL

## 2016-07-01 NOTE — Progress Notes (Signed)
Labs reviewed with Dr. Tony Friscia Muse prior to releasing chemotherapy, MD fine with lab values and instructs to treat patient as long as he is afebrile, which he is and has been.  Patient tolerated infusion well.  VSS.  Patient ambulatory and stable upon discharge from clinic.

## 2016-07-01 NOTE — Patient Instructions (Signed)
[facility] Discharge Instructions for Patients Receiving Chemotherapy   Beginning January 23rd 2017 lab work for the Cancer Center will be done in the  Main lab at [place] on 1st floor. If you have a lab appointment with the Cancer Center please come in thru the  Main Entrance and check in at the main information desk   Today you received the following chemotherapy agents:  Abraxane and Gemzar.  If you develop nausea and vomiting, or diarrhea that is not controlled by your medication, call the clinic.  The clinic phone number is [phone]. Office hours are Monday-Friday 8:30am-5:00pm.  BELOW ARE SYMPTOMS THAT SHOULD BE REPORTED IMMEDIATELY:  *FEVER GREATER THAN 101.0 F  *CHILLS WITH OR WITHOUT FEVER  NAUSEA AND VOMITING THAT IS NOT CONTROLLED WITH YOUR NAUSEA MEDICATION  *UNUSUAL SHORTNESS OF BREATH  *UNUSUAL BRUISING OR BLEEDING  TENDERNESS IN MOUTH AND THROAT WITH OR WITHOUT PRESENCE OF ULCERS  *URINARY PROBLEMS  *BOWEL PROBLEMS  UNUSUAL RASH Items with * indicate a potential emergency and should be followed up as soon as possible. If you have an emergency after office hours please contact your primary care physician or go to the nearest emergency department.  Please call the clinic during office hours if you have any questions or concerns.   You may also contact the Patient Navigator at [phone] should you have any questions or need assistance in obtaining follow up care.      Resources For Cancer Patients and their Caregivers ? American Cancer Society: Can assist with transportation, wigs, general needs, runs Look Good Feel Better.        1-888-227-6333 ? Cancer Care: Provides financial assistance, online support groups, medication/co-pay assistance.  1-800-813-HOPE (4673) ? [facility] Assists [place] cancer patients and their families through emotional , educational and financial support.   336-427-4357 ? [place] DSS Where to apply for food stamps, Medicaid and utility assistance. 336-342-1394 ? [facility]: Transportation to medical appointments. 336-347-2287 ? Social Security Administration: May apply for disability if have a Stage IV cancer. 336-342-7796 1-800-772-1213 ? [place] [employer], Disability and Transit Services: Assists with nutrition, care and transit needs. 336-349-2343         

## 2016-07-02 LAB — CANCER ANTIGEN 19-9: CA 19 9: 180 U/mL — AB (ref 0–35)

## 2016-07-02 NOTE — Patient Instructions (Signed)
Nicholas [name]  07/02/2016     @PREFPERIOPPHARMACY @   Your procedure is scheduled on  07/09/2016   Report to [facility] at  730  A.M.  Call this number if you have problems the morning of surgery:  [phone]   Remember:  Do not eat food or drink liquids after midnight.  Take these medicines the morning of surgery with A SIP OF WATER  Oxycodone.   Do not wear jewelry, make-up or nail polish.  Do not wear lotions, powders, or perfumes, or deoderant.  Do not shave 48 hours prior to surgery.  Men may shave face and neck.  Do not bring valuables to the hospital.  [facility] is not responsible for any belongings or valuables.  Contacts, dentures or bridgework may not be worn into surgery.  Leave your suitcase in the car.  After surgery it may be brought to your room.  For patients admitted to the hospital, discharge time will be determined by your treatment team.  Patients discharged the day of surgery will not be allowed to drive home.   Name and phone number of your driver:   family Special instructions:  Follow the diet instructions given to you by Dr Olevia Perches office.  Please read over the following fact sheets that you were given. Anesthesia Post-op Instructions and Care and Recovery After Surgery       Esophagogastroduodenoscopy Introduction Esophagogastroduodenoscopy (EGD) is a procedure to examine the lining of the esophagus, stomach, and first part of the small intestine (duodenum). This procedure is done to check for problems such as inflammation, bleeding, ulcers, or growths. During this procedure, a long, flexible, lighted tube with a camera attached (endoscope) is inserted down the throat. Tell a health care provider about:  Any allergies you have.  All medicines you are taking, including vitamins, herbs, eye drops, creams, and over-the-counter medicines.  Any problems you or family members have had with anesthetic medicines.  Any blood  disorders you have.  Any surgeries you have had.  Any medical conditions you have.  Whether you are pregnant or may be pregnant. What are the risks? Generally, this is a safe procedure. However, problems may occur, including:  Infection.  Bleeding.  A tear (perforation) in the esophagus, stomach, or duodenum.  Trouble breathing.  Excessive sweating.  Spasms of the larynx.  A slowed heartbeat.  Low blood pressure. What happens before the procedure?  Follow instructions from your health care provider about eating or drinking restrictions.  Ask your health care provider about:  Changing or stopping your regular medicines. This is especially important if you are taking diabetes medicines or blood thinners.  Taking medicines such as aspirin and ibuprofen. These medicines can thin your blood. Do not take these medicines before your procedure if your health care provider instructs you not to.  Plan to have someone take you home after the procedure.  If you wear dentures, be ready to remove them before the procedure. What happens during the procedure?  To reduce your risk of infection, your health care team will wash or sanitize their hands.  An IV tube will be put in a vein in your hand or arm. You will get medicines and fluids through this tube.  You will be given one or more of the following:  A medicine to help you relax (sedative).  A medicine to numb the area (local anesthetic). This medicine may be sprayed into your throat. It will  make you feel more comfortable and keep you from gagging or coughing during the procedure.  A medicine for pain.  A mouth guard may be placed in your mouth to protect your teeth and to keep you from biting on the endoscope.  You will be asked to lie on your left side.  The endoscope will be lowered down your throat into your esophagus, stomach, and duodenum.  Air will be put into the endoscope. This will help your health care  provider see better.  The lining of your esophagus, stomach, and duodenum will be examined.  Your health care provider may:  Take a tissue sample so it can be looked at in a lab (biopsy).  Remove growths.  Remove objects (foreign bodies) that are stuck.  Treat any bleeding with medicines or other devices that stop tissue from bleeding.  Widen (dilate) or stretch narrowed areas of your esophagus and stomach.  The endoscope will be taken out. The procedure may vary among health care providers and hospitals. What happens after the procedure?  Your blood pressure, heart rate, breathing rate, and blood oxygen level will be monitored often until the medicines you were given have worn off.  Do not eat or drink anything until the numbing medicine has worn off and your gag reflex has returned. This information is not intended to replace advice given to you by your health care provider. Make sure you discuss any questions you have with your health care provider. Document Released: 10/22/2004 Document Revised: 11/27/2015 Document Reviewed: 05/15/2015  2017 Elsevier Esophagogastroduodenoscopy, Care After Introduction Refer to this sheet in the next few weeks. These instructions provide you with information about caring for yourself after your procedure. Your health care provider may also give you more specific instructions. Your treatment has been planned according to current medical practices, but problems sometimes occur. Call your health care provider if you have any problems or questions after your procedure. What can I expect after the procedure? After the procedure, it is common to have:  A sore throat.  Nausea.  Bloating.  Dizziness.  Fatigue. Follow these instructions at home:  Do not eat or drink anything until the numbing medicine (local anesthetic) has worn off and your gag reflex has returned. You will know that the local anesthetic has worn off when you can swallow  comfortably.  Do not drive for 24 hours if you received a medicine to help you relax (sedative).  If your health care provider took a tissue sample for testing during the procedure, make sure to get your test results. This is your responsibility. Ask your health care provider or the department performing the test when your results will be ready.  Keep all follow-up visits as told by your health care provider. This is important. Contact a health care provider if:  You cannot stop coughing.  You are not urinating.  You are urinating less than usual. Get help right away if:  You have trouble swallowing.  You cannot eat or drink.  You have throat or chest pain that gets worse.  You are dizzy or light-headed.  You faint.  You have nausea or vomiting.  You have chills.  You have a fever.  You have severe abdominal pain.  You have black, tarry, or bloody stools. This information is not intended to replace advice given to you by your health care provider. Make sure you discuss any questions you have with your health care provider. Document Released: 06/07/2012 Document Revised: 11/27/2015 Document Reviewed:  05/15/2015  2017 Elsevier  Monitored Anesthesia Care Anesthesia is a term that refers to techniques, procedures, and medicines that help a person stay safe and comfortable during a medical procedure. Monitored anesthesia care, or sedation, is one type of anesthesia. Your anesthesia specialist may recommend sedation if you will be having a procedure that does not require you to be unconscious, such as:  Cataract surgery.  A dental procedure.  A biopsy.  A colonoscopy. During the procedure, you may receive a medicine to help you relax (sedative). There are three levels of sedation:  Mild sedation. At this level, you may feel awake and relaxed. You will be able to follow directions.  Moderate sedation. At this level, you will be sleepy. You may not remember the  procedure.  Deep sedation. At this level, you will be asleep. You will not remember the procedure. The more medicine you are given, the deeper your level of sedation will be. Depending on how you respond to the procedure, the anesthesia specialist may change your level of sedation or the type of anesthesia to fit your needs. An anesthesia specialist will monitor you closely during the procedure. Let your health care provider know about:  Any allergies you have.  All medicines you are taking, including vitamins, herbs, eye drops, creams, and over-the-counter medicines.  Any use of steroids (by mouth or as a cream).  Any problems you or family members have had with sedatives and anesthetic medicines.  Any blood disorders you have.  Any surgeries you have had.  Any medical conditions you have, such as sleep apnea.  Whether you are pregnant or may be pregnant.  Any use of cigarettes, alcohol, or street drugs. What are the risks? Generally, this is a safe procedure. However, problems may occur, including:  Getting too much medicine (oversedation).  Nausea.  Allergic reaction to medicines.  Trouble breathing. If this happens, a breathing tube may be used to help with breathing. It will be removed when you are awake and breathing on your own.  Heart trouble.  Lung trouble. Before the procedure Staying hydrated  Follow instructions from your health care provider about hydration, which may include:  Up to 2 hours before the procedure - you may continue to drink clear liquids, such as water, clear fruit juice, black coffee, and plain tea. Eating and drinking restrictions  Follow instructions from your health care provider about eating and drinking, which may include:  8 hours before the procedure - stop eating heavy meals or foods such as meat, fried foods, or fatty foods.  6 hours before the procedure - stop eating light meals or foods, such as toast or cereal.  6 hours before  the procedure - stop drinking milk or drinks that contain milk.  2 hours before the procedure - stop drinking clear liquids. Medicines  Ask your health care provider about:  Changing or stopping your regular medicines. This is especially important if you are taking diabetes medicines or blood thinners.  Taking medicines such as aspirin and ibuprofen. These medicines can thin your blood. Do not take these medicines before your procedure if your health care provider instructs you not to. Tests and exams  You will have a physical exam.  You may have blood tests done to show:  How well your kidneys and liver are working.  How well your blood can clot.  General instructions  Plan to have someone take you home from the hospital or clinic.  If you will be going home  right after the procedure, plan to have someone with you for 24 hours. What happens during the procedure?  Your blood pressure, heart rate, breathing, level of pain and overall condition will be monitored.  An IV tube will be inserted into one of your veins.  Your anesthesia specialist will give you medicines as needed to keep you comfortable during the procedure. This may mean changing the level of sedation.  The procedure will be performed. After the procedure  Your blood pressure, heart rate, breathing rate, and blood oxygen level will be monitored until the medicines you were given have worn off.  Do not drive for 24 hours if you received a sedative.  You may:  Feel sleepy, clumsy, or nauseous.  Feel forgetful about what happened after the procedure.  Have a sore throat if you had a breathing tube during the procedure.  Vomit. This information is not intended to replace advice given to you by your health care provider. Make sure you discuss any questions you have with your health care provider. Document Released: 03/17/2005 Document Revised: 11/28/2015 Document Reviewed: 10/12/2015 Elsevier Interactive  Patient Education  2017 [place], Care After These instructions provide you with information about caring for yourself after your procedure. Your health care provider may also give you more specific instructions. Your treatment has been planned according to current medical practices, but problems sometimes occur. Call your health care provider if you have any problems or questions after your procedure. What can I expect after the procedure? After your procedure, it is common to:  Feel sleepy for several hours.  Feel clumsy and have poor balance for several hours.  Feel forgetful about what happened after the procedure.  Have poor judgment for several hours.  Feel nauseous or vomit.  Have a sore throat if you had a breathing tube during the procedure. Follow these instructions at home: For at least 24 hours after the procedure:   Do not:  Participate in activities in which you could fall or become injured.  Drive.  Use heavy machinery.  Drink alcohol.  Take sleeping pills or medicines that cause drowsiness.  Make important decisions or sign legal documents.  Take care of children on your own.  Rest. Eating and drinking  Follow the diet that is recommended by your health care provider.  If you vomit, drink water, juice, or soup when you can drink without vomiting.  Make sure you have little or no nausea before eating solid foods. General instructions  Have a responsible adult stay with you until you are awake and alert.  Take over-the-counter and prescription medicines only as told by your health care provider.  If you smoke, do not smoke without supervision.  Keep all follow-up visits as told by your health care provider. This is important. Contact a health care provider if:  You keep feeling nauseous or you keep vomiting.  You feel light-headed.  You develop a rash.  You have a fever. Get help right away if:  You have  trouble breathing. This information is not intended to replace advice given to you by your health care provider. Make sure you discuss any questions you have with your health care provider. Document Released: 10/12/2015 Document Revised: 02/11/2016 Document Reviewed: 10/12/2015 Elsevier Interactive Patient Education  2017 [employer].

## 2016-07-07 ENCOUNTER — Encounter (HOSPITAL_COMMUNITY): Payer: Self-pay

## 2016-07-07 ENCOUNTER — Other Ambulatory Visit (HOSPITAL_COMMUNITY): Payer: Self-pay | Admitting: Oncology

## 2016-07-07 ENCOUNTER — Encounter (HOSPITAL_COMMUNITY)
Admission: RE | Admit: 2016-07-07 | Discharge: 2016-07-07 | Disposition: A | Discharge status: Home / Self Care | Payer: Medicaid Other | Source: Ambulatory Visit | Attending: Internal Medicine | Admitting: Internal Medicine

## 2016-07-07 DIAGNOSIS — C251 Malignant neoplasm of body of pancreas: Secondary | ICD-10-CM

## 2016-07-07 DIAGNOSIS — R1013 Epigastric pain: Secondary | ICD-10-CM

## 2016-07-07 DIAGNOSIS — Z01810 Encounter for preprocedural cardiovascular examination: Secondary | ICD-10-CM | POA: Diagnosis present

## 2016-07-07 HISTORY — DX: Dyspnea, unspecified: R06.00

## 2016-07-07 HISTORY — DX: Prediabetes: R73.03

## 2016-07-08 ENCOUNTER — Other Ambulatory Visit (HOSPITAL_COMMUNITY): Payer: Medicaid Other

## 2016-07-08 ENCOUNTER — Ambulatory Visit (HOSPITAL_COMMUNITY): Payer: Medicaid Other | Admitting: Hematology & Oncology

## 2016-07-08 ENCOUNTER — Ambulatory Visit (HOSPITAL_COMMUNITY): Payer: Medicaid Other

## 2016-07-09 ENCOUNTER — Ambulatory Visit (HOSPITAL_COMMUNITY): Payer: Medicaid Other | Admitting: Anesthesiology

## 2016-07-09 ENCOUNTER — Ambulatory Visit (HOSPITAL_COMMUNITY)
Admission: RE | Admit: 2016-07-09 | Discharge: 2016-07-09 | Disposition: A | Discharge status: Home / Self Care | Payer: Medicaid Other | Source: Ambulatory Visit | Attending: Internal Medicine | Admitting: Internal Medicine

## 2016-07-09 ENCOUNTER — Encounter (HOSPITAL_COMMUNITY)
Admission: RE | Disposition: A | Discharge status: Home / Self Care | Payer: Self-pay | Source: Ambulatory Visit | Attending: Internal Medicine

## 2016-07-09 ENCOUNTER — Encounter (HOSPITAL_COMMUNITY): Payer: Self-pay | Admitting: *Deleted

## 2016-07-09 DIAGNOSIS — K210 Gastro-esophageal reflux disease with esophagitis: Secondary | ICD-10-CM | POA: Diagnosis not present

## 2016-07-09 DIAGNOSIS — Z9221 Personal history of antineoplastic chemotherapy: Secondary | ICD-10-CM | POA: Diagnosis not present

## 2016-07-09 DIAGNOSIS — C259 Malignant neoplasm of pancreas, unspecified: Secondary | ICD-10-CM | POA: Insufficient documentation

## 2016-07-09 DIAGNOSIS — K219 Gastro-esophageal reflux disease without esophagitis: Secondary | ICD-10-CM

## 2016-07-09 DIAGNOSIS — E119 Type 2 diabetes mellitus without complications: Secondary | ICD-10-CM | POA: Diagnosis not present

## 2016-07-09 DIAGNOSIS — Z7984 Long term (current) use of oral hypoglycemic drugs: Secondary | ICD-10-CM | POA: Diagnosis not present

## 2016-07-09 DIAGNOSIS — K3189 Other diseases of stomach and duodenum: Secondary | ICD-10-CM | POA: Insufficient documentation

## 2016-07-09 DIAGNOSIS — C251 Malignant neoplasm of body of pancreas: Secondary | ICD-10-CM | POA: Insufficient documentation

## 2016-07-09 DIAGNOSIS — K766 Portal hypertension: Secondary | ICD-10-CM | POA: Insufficient documentation

## 2016-07-09 DIAGNOSIS — Z7982 Long term (current) use of aspirin: Secondary | ICD-10-CM | POA: Insufficient documentation

## 2016-07-09 DIAGNOSIS — Z85828 Personal history of other malignant neoplasm of skin: Secondary | ICD-10-CM | POA: Diagnosis not present

## 2016-07-09 DIAGNOSIS — F17210 Nicotine dependence, cigarettes, uncomplicated: Secondary | ICD-10-CM | POA: Insufficient documentation

## 2016-07-09 DIAGNOSIS — K449 Diaphragmatic hernia without obstruction or gangrene: Secondary | ICD-10-CM | POA: Diagnosis not present

## 2016-07-09 DIAGNOSIS — K228 Other specified diseases of esophagus: Secondary | ICD-10-CM | POA: Diagnosis not present

## 2016-07-09 DIAGNOSIS — R1013 Epigastric pain: Secondary | ICD-10-CM | POA: Insufficient documentation

## 2016-07-09 DIAGNOSIS — K5281 Eosinophilic gastritis or gastroenteritis: Secondary | ICD-10-CM | POA: Insufficient documentation

## 2016-07-09 HISTORY — PX: ESOPHAGOGASTRODUODENOSCOPY (EGD) WITH PROPOFOL: SHX5813

## 2016-07-09 HISTORY — PX: BIOPSY: SHX5522

## 2016-07-09 LAB — GLUCOSE, CAPILLARY
Glucose-Capillary: 111 mg/dL — ABNORMAL HIGH (ref 65–99)
Glucose-Capillary: 113 mg/dL — ABNORMAL HIGH (ref 65–99)

## 2016-07-09 SURGERY — ESOPHAGOGASTRODUODENOSCOPY (EGD) WITH PROPOFOL
Anesthesia: Monitor Anesthesia Care

## 2016-07-09 MED ORDER — LACTATED RINGERS IV SOLN
INTRAVENOUS | Status: DC
  Administered 2016-07-09: 1000 mL via INTRAVENOUS

## 2016-07-09 MED ORDER — FENTANYL CITRATE (PF) 100 MCG/2ML IJ SOLN
25.0000 ug | INTRAMUSCULAR | Status: AC | PRN
  Administered 2016-07-09 (×2): 25 ug via INTRAVENOUS

## 2016-07-09 MED ORDER — FENTANYL CITRATE (PF) 100 MCG/2ML IJ SOLN
INTRAMUSCULAR | Status: AC
  Filled 2016-07-09: qty 2

## 2016-07-09 MED ORDER — MIDAZOLAM HCL 5 MG/5ML IJ SOLN
INTRAMUSCULAR | Status: DC | PRN
  Administered 2016-07-09 (×2): 1 mg via INTRAVENOUS

## 2016-07-09 MED ORDER — PANTOPRAZOLE SODIUM 40 MG PO TBEC: 40.0000 mg | DELAYED_RELEASE_TABLET | Freq: Every day | ORAL | 5 refills | Status: AC

## 2016-07-09 MED ORDER — LIDOCAINE HCL (PF) 1 % IJ SOLN
INTRAMUSCULAR | Status: AC
  Filled 2016-07-09: qty 5

## 2016-07-09 MED ORDER — SUCRALFATE 1 GM/10ML PO SUSP
1.0000 g | Freq: Three times a day (TID) | ORAL | Status: DC
  Filled 2016-07-09: qty 10

## 2016-07-09 MED ORDER — LIDOCAINE HCL (CARDIAC) 10 MG/ML IV SOLN
INTRAVENOUS | Status: DC | PRN
  Administered 2016-07-09: 30 mg via INTRAVENOUS

## 2016-07-09 MED ORDER — CHLORHEXIDINE GLUCONATE CLOTH 2 % EX PADS
6.0000 | MEDICATED_PAD | Freq: Once | CUTANEOUS | Status: DC
Start: 2016-07-09 — End: 2016-07-09

## 2016-07-09 MED ORDER — SODIUM CHLORIDE 0.9 % IV SOLN: INTRAVENOUS | Status: DC

## 2016-07-09 MED ORDER — PROPOFOL 500 MG/50ML IV EMUL
INTRAVENOUS | Status: DC | PRN
  Administered 2016-07-09: 100 ug/kg/min via INTRAVENOUS

## 2016-07-09 MED ORDER — GLYCOPYRROLATE 0.2 MG/ML IJ SOLN
0.2000 mg | Freq: Once | INTRAMUSCULAR | Status: AC | PRN
  Administered 2016-07-09: 0.2 mg via INTRAVENOUS
  Filled 2016-07-09: qty 1

## 2016-07-09 MED ORDER — MIDAZOLAM HCL 2 MG/2ML IJ SOLN
INTRAMUSCULAR | Status: AC
  Filled 2016-07-09: qty 2

## 2016-07-09 MED ORDER — PROPOFOL 10 MG/ML IV BOLUS
INTRAVENOUS | Status: AC
  Filled 2016-07-09: qty 20

## 2016-07-09 MED ORDER — CHLORHEXIDINE GLUCONATE CLOTH 2 % EX PADS: 6.0000 | MEDICATED_PAD | Freq: Once | CUTANEOUS | Status: DC

## 2016-07-09 MED ORDER — MIDAZOLAM HCL 2 MG/2ML IJ SOLN
1.0000 mg | INTRAMUSCULAR | Status: DC | PRN
Start: 2016-07-09 — End: 2016-07-09
  Administered 2016-07-09: 2 mg via INTRAVENOUS
  Filled 2016-07-09: qty 2

## 2016-07-09 NOTE — Anesthesia Preprocedure Evaluation (Signed)
Anesthesia Evaluation  Patient identified by MRN, date of birth, ID band Patient awake    Reviewed: Allergy & Precautions, NPO status , Patient's Chart, lab work & pertinent test results  Airway Mallampati: I  TM Distance: >3 FB     Dental  (+) Poor Dentition, Partial Upper   Pulmonary shortness of breath and with exertion, Current Smoker,  + TB   breath sounds clear to auscultation       Cardiovascular + DOE   Rhythm:Regular Rate:Normal     Neuro/Psych PSYCHIATRIC DISORDERS    GI/Hepatic pancreatic CA   Endo/Other  diabetes, Type 2, Oral Hypoglycemic Agents  Renal/GU      Musculoskeletal   Abdominal   Peds  Hematology   Anesthesia Other Findings   Reproductive/Obstetrics                             Anesthesia Physical Anesthesia Plan  ASA: III  Anesthesia Plan: MAC   Post-op Pain Management:    Induction: Intravenous  Airway Management Planned: Simple Face Mask  Additional Equipment:   Intra-op Plan:   Post-operative Plan:   Informed Consent: I have reviewed the patients History and Physical, chart, labs and discussed the procedure including the risks, benefits and alternatives for the proposed anesthesia with the patient or authorized representative who has indicated his/her understanding and acceptance.     Plan Discussed with:   Anesthesia Plan Comments:         Anesthesia Quick Evaluation

## 2016-07-09 NOTE — Anesthesia Procedure Notes (Signed)
Procedure Name: MAC Date/Time: 07/09/2016 8:43 AM Performed by: Andree Elk, AMY A Pre-anesthesia Checklist: Patient identified, Emergency Drugs available, Suction available, Patient being monitored and Timeout performed Oxygen Delivery Method: Simple face mask

## 2016-07-09 NOTE — Op Note (Addendum)
[facility] Patient Name: Nicholas [name] Procedure Date: 07/09/2016 8:30 AM MRN: [id number] Date of Birth: May 07, 1951 Attending MD: Hildred Laser , MD CSN: [id number] Age: 66 Admit Type: Outpatient Procedure:                Upper GI endoscopy Indications:              Epigastric abdominal pain. Frequent nausea and                            sporadic vomiting in a patient with known                            pancreatic adenocarcinoma. Providers:                Hildred Laser, MD, Charlyne Petrin RN, RN, Isabella Stalling, Technician Referring MD:             Ancil Linsey MD Medicines:                Cetacaine spray, Propofol per Anesthesia Complications:            No immediate complications. Estimated Blood Loss:     Estimated blood loss was minimal. Estimated blood                            loss was minimal. Procedure:                Pre-Anesthesia Assessment:                           - Prior to the procedure, a History and Physical                            was performed, and patient medications and                            allergies were reviewed. The patient's tolerance of                            previous anesthesia was also reviewed. The risks                            and benefits of the procedure and the sedation                            options and risks were discussed with the patient.                            All questions were answered, and informed consent                            was obtained. ASA Grade Assessment: III - A patient  with severe systemic disease. After reviewing the                            risks and benefits, the patient was deemed in                            satisfactory condition to undergo the procedure.                           After obtaining informed consent, the endoscope was                            passed under direct vision. Throughout the   procedure, the patient's blood pressure, pulse, and                            oxygen saturations were monitored continuously. The                            [id number]) scope was introduced through the                            mouth, and advanced to the second part of duodenum.                            The upper GI endoscopy was accomplished without                            difficulty. The patient tolerated the procedure                            well. Scope In: 8:53:47 AM Scope Out: 9:04:50 AM Total Procedure Duration: 0 hours 11 minutes 3 seconds  Findings:      The proximal esophagus and mid esophagus were normal.      LA Grade B (one or more mucosal breaks greater than 5 mm, not extending       between the tops of two mucosal folds) esophagitis was found 41 to 42 cm       from the incisors.      The Z-line was irregular and was found 42 cm from the incisors.      A 2 cm hiatal hernia was present.      Mild portal hypertensive gastropathy was found in the gastric fundus and       in the gastric body.      A few erosions were found in the prepyloric region of the stomach.       Biopsies were taken with a cold forceps for histology.      The duodenal bulb and second portion of the duodenum were normal. Impression:               - Normal proximal esophagus and mid esophagus.                           - LA Grade B reflux esophagitis.                           -  Z-line irregular, 42 cm from the incisors.                           - 2 cm hiatal hernia.                           - Portal hypertensive gastropathy.                           - Erosive gastropathy. Biopsied.                           - Normal duodenal bulb and second portion of the                            duodenum. Moderate Sedation:      Per Anesthesia Care Recommendation:           - Patient has a contact number available for                            emergencies. The signs and symptoms of potential                             delayed complications were discussed with the                            patient. Return to normal activities tomorrow.                            Written discharge instructions were provided to the                            patient.                           - Resume previous diet today.                           - Continue present medications.                           - Use Protonix (pantoprazole) 40 mg PO BID.                           - Use sucralfate tablets 1 gram PO QID.                           - Await pathology results. Procedure Code(s):        --- Professional ---                           [phone], Esophagogastroduodenoscopy, flexible,                            transoral; with biopsy, single or multiple Diagnosis Code(s):        --- Professional ---  K21.0, Gastro-esophageal reflux disease with                            esophagitis                           K22.8, Other specified diseases of esophagus                           K44.9, Diaphragmatic hernia without obstruction or                            gangrene                           K76.6, Portal hypertension                           K31.89, Other diseases of stomach and duodenum                           R10.13, Epigastric pain CPT copyright 2016 [employer]. All rights reserved. The codes documented in this report are preliminary and upon coder review may  be revised to meet current compliance requirements. Hildred Laser, MD Hildred Laser, MD 07/09/2016 9:18:46 AM This report has been signed electronically. Number of Addenda: 0

## 2016-07-09 NOTE — H&P (Signed)
Nicholas [name] is an 66 y.o. male.   Chief Complaint: Patient is here for EGD. HPI: Patient is 66 year old Caucasian male who was diagnosed with pancreatic carcinoma and he presented with upper abdominal pain anorexia and weight loss in May when he 45. He has been receiving chemotherapy every 3 weeks. I continue to lose weight. Lately she's had frequent epigastric pain nausea and sporadic vomiting. He states he had multiple episodes of vomiting last Saturday. He denies hematemesis melena or rectal bleeding. He states he has been gradually losing weight. However he weighed 115 pounds when I saw him in 11/12/2015 and now his weight is reportedly 114 pounds. Last chemotherapy was 8 days ago. Patient's last CT was on 05/05/2016 revealing reduction in size of pancreatic primary. He has developed multiple liver lesions. He takes Aleve no more than couple of times a month.  Past Medical History:  Diagnosis Date  . Abdominal pain in male    "was told he has a Mass near pancreas"  . Cancer (HCC)    Skin cancer-ear, skin cancer -leeg,cancer of leg muscle- left., basal cell   . Dyspnea    From previous TB  . Pancreatic cancer ([place]) 12/02/2015  . Pancreatic mass april 2017  . Pre-diabetes   . TB (pulmonary tuberculosis)    3 yrs ago-tx" positive TB test aslso" was told he is not contagious.treatment done for regulat TB.    Past Surgical History:  Procedure Laterality Date  . COLONOSCOPY W/ POLYPECTOMY    . EUS N/A 11/25/2015   Procedure: ESOPHAGEAL ENDOSCOPIC ULTRASOUND (EUS) RADIAL;  Surgeon: Milus Banister, MD;  Location: [facility];  Service: Endoscopy;  Laterality: N/A;  . LEG SURGERY Left    " cancer removed from muscle"    Family History  Problem Relation Age of Onset  . Stroke Mother   . Pneumonia Father    Social History:  reports that he has been smoking Cigarettes.  He has a 15.00 pack-year smoking history. He has never used smokeless tobacco. He reports that he does not drink  alcohol or use drugs.  Allergies: No Known Allergies  Medications Prior to Admission  Medication Sig Dispense Refill  . aspirin EC 81 MG tablet Take 81 mg by mouth daily.    Marland Kitchen DANDELION PO Take 1 capsule by mouth every morning.    . diphenhydrAMINE (SLEEP AID, DIPHENHYDRAMINE,) 25 MG tablet Take 25 mg by mouth at bedtime as needed and may repeat dose one time if needed for sleep.    . Gemcitabine HCl (GEMZAR IV) Inject into the vein. To begin June 6    . hydrocerin (EUCERIN) CREA Apply 1 application topically 2 (two) times daily as needed (For dry skin.).    Marland Kitchen lidocaine-prilocaine (EMLA) cream Apply a quarter size amount to port site 1 hour prior to chemo. Do not rub in. Cover with plastic wrap. 30 g 3  . metFORMIN (GLUCOPHAGE) 500 MG tablet Take 1 tablet (500 mg total) by mouth daily with breakfast. 30 tablet 1  . Multiple Vitamins-Minerals (MULTIVITAMINS THER. W/MINERALS) TABS tablet Take 1 tablet by mouth daily. Reported on 12/09/2015    . oxyCODONE (OXYCONTIN) 15 mg 12 hr tablet Take 1 tablet (15 mg total) by mouth every 8 (eight) hours. 90 tablet 0  . Oxycodone HCl 10 MG TABS Take 1 tablet (10 mg total) by mouth every 4 (four) hours as needed. Ok to fill on 05/21/2016, dose change 120 tablet 0  . PACLitaxel Protein-Bound Part (ABRAXANE IV)  Inject into the vein. To begin June 6    . senna-docusate (SENOKOT S) 8.6-50 MG tablet Take 2 tablets by mouth daily as needed for mild constipation.     . lipase/protease/amylase (CREON) 36000 UNITS CPEP capsule Take 1 capsule (36,000 Units total) by mouth 3 (three) times daily before meals. (Patient not taking: Reported on 06/30/2016) 180 capsule 3    Results for orders placed or performed during the hospital encounter of 07/09/16 (from the past 48 hour(s))  Glucose, capillary     Status: Abnormal   Collection Time: 07/09/16  7:57 AM  Result Value Ref Range   Glucose-Capillary 111 (H) 65 - 99 mg/dL   No results found.  ROS  Blood pressure  134/81, temperature 97.6 F (36.4 C), temperature source Oral, resp. rate 19, SpO2 98 %. Physical Exam  Constitutional:  Thin Caucasian male who is in no acute distress.  HENT:  Mouth/Throat: Oropharynx is clear and moist.  He has few remaining teeth.  Eyes: Conjunctivae are normal. No scleral icterus.  Neck: No thyromegaly present.  Cardiovascular: Normal rate, regular rhythm and normal heart sounds.   No murmur heard. Respiratory: Effort normal and breath sounds normal.  GI:  Abdomen is flat and soft with mild midepigastric tenderness. No organomegaly or masses.  Musculoskeletal: He exhibits no edema.  Lymphadenopathy:    He has no cervical adenopathy.  Neurological: He is alert.  Skin: Skin is warm and dry.     Assessment/Plan Frequent nausea with sporadic vomiting and epigastric pain in the patient with known pancreatic adenocarcinoma who is on palliative chemotherapy. Diagnostic EGD.   Hildred Laser, MD 07/09/2016, 8:40 AM

## 2016-07-09 NOTE — Discharge Instructions (Signed)
Resume usual medications and diet.  Pantoprazole 40 mg by mouth 30 minutes before breakfast and evening meal daily. Sucralfate 1 g by mouth before meals and daily at bedtime. No driving for 24 hours. Physician will call with biopsy results.  Esophagogastroduodenoscopy, Care After Introduction Refer to this sheet in the next few weeks. These instructions provide you with information about caring for yourself after your procedure. Your health care provider may also give you more specific instructions. Your treatment has been planned according to current medical practices, but problems sometimes occur. Call your health care provider if you have any problems or questions after your procedure. What can I expect after the procedure? After the procedure, it is common to have:  A sore throat.  Nausea.  Bloating.  Dizziness.  Fatigue. Follow these instructions at home:  Do not eat or drink anything until the numbing medicine (local anesthetic) has worn off and your gag reflex has returned. You will know that the local anesthetic has worn off when you can swallow comfortably.  Do not drive for 24 hours if you received a medicine to help you relax (sedative).  If your health care provider took a tissue sample for testing during the procedure, make sure to get your test results. This is your responsibility. Ask your health care provider or the department performing the test when your results will be ready.  Keep all follow-up visits as told by your health care provider. This is important. Contact a health care provider if:  You cannot stop coughing.  You are not urinating.  You are urinating less than usual. Get help right away if:  You have trouble swallowing.  You cannot eat or drink.  You have throat or chest pain that gets worse.  You are dizzy or light-headed.  You faint.  You have nausea or vomiting.  You have chills.  You have a fever.  You have severe abdominal  pain.  You have black, tarry, or bloody stools. This information is not intended to replace advice given to you by your health care provider. Make sure you discuss any questions you have with your health care provider. Document Released: 06/07/2012 Document Revised: 11/27/2015 Document Reviewed: 05/15/2015  2017 Elsevier

## 2016-07-09 NOTE — Anesthesia Postprocedure Evaluation (Signed)
Anesthesia Post Note  Patient: Nicholas [name]  Procedure(s) Performed: Procedure(s) (LRB): ESOPHAGOGASTRODUODENOSCOPY (EGD) WITH PROPOFOL (N/A) BIOPSY  Patient location during evaluation: PACU Anesthesia Type: MAC Level of consciousness: awake and oriented Pain management: pain level controlled Vital Signs Assessment: post-procedure vital signs reviewed and stable Respiratory status: spontaneous breathing and respiratory function stable Cardiovascular status: stable Postop Assessment: no signs of nausea or vomiting Anesthetic complications: no     Last Vitals:  Vitals:   07/09/16 0835 07/09/16 0840  BP: 123/76 126/82  Resp: 13 18  Temp:      Last Pain:  Vitals:   07/09/16 0846  TempSrc:   PainSc: 0-No pain                 Clearnce Leja A

## 2016-07-09 NOTE — Transfer of Care (Signed)
Immediate Anesthesia Transfer of Care Note  Patient: Nicholas [name]  Procedure(s) Performed: Procedure(s) with comments: ESOPHAGOGASTRODUODENOSCOPY (EGD) WITH PROPOFOL (N/A) - 7:30 BIOPSY - gastric biopsies of antrum,   Patient Location: PACU  Anesthesia Type:MAC  Level of Consciousness: awake, alert , oriented and patient cooperative  Airway & Oxygen Therapy: Patient Spontanous Breathing and Patient connected to nasal cannula oxygen  Post-op Assessment: Report given to RN and Post -op Vital signs reviewed and stable  Post vital signs: Reviewed and stable  Last Vitals:  Vitals:   07/09/16 0835 07/09/16 0840  BP: 123/76 126/82  Resp: 13 18  Temp:      Last Pain:  Vitals:   07/09/16 0846  TempSrc:   PainSc: 0-No pain      Patients Stated Pain Goal: 8 (07/86/75 4492)  Complications: No apparent anesthesia complications

## 2016-07-12 ENCOUNTER — Encounter (HOSPITAL_COMMUNITY): Payer: Self-pay | Admitting: Internal Medicine

## 2016-07-14 ENCOUNTER — Telehealth (INDEPENDENT_AMBULATORY_CARE_PROVIDER_SITE_OTHER): Payer: Self-pay | Admitting: *Deleted

## 2016-07-14 ENCOUNTER — Emergency Department (HOSPITAL_COMMUNITY): Payer: Medicaid Other

## 2016-07-14 ENCOUNTER — Encounter (HOSPITAL_COMMUNITY): Payer: Self-pay

## 2016-07-14 ENCOUNTER — Emergency Department (HOSPITAL_COMMUNITY)
Admission: EM | Admit: 2016-07-14 | Discharge: 2016-07-15 | Disposition: A | Discharge status: Home / Self Care | Payer: Medicaid Other | Attending: Emergency Medicine | Admitting: Emergency Medicine

## 2016-07-14 DIAGNOSIS — F17210 Nicotine dependence, cigarettes, uncomplicated: Secondary | ICD-10-CM | POA: Diagnosis not present

## 2016-07-14 DIAGNOSIS — Z85828 Personal history of other malignant neoplasm of skin: Secondary | ICD-10-CM | POA: Diagnosis not present

## 2016-07-14 DIAGNOSIS — Z7982 Long term (current) use of aspirin: Secondary | ICD-10-CM | POA: Diagnosis not present

## 2016-07-14 DIAGNOSIS — R1084 Generalized abdominal pain: Secondary | ICD-10-CM | POA: Diagnosis not present

## 2016-07-14 DIAGNOSIS — Z7984 Long term (current) use of oral hypoglycemic drugs: Secondary | ICD-10-CM | POA: Diagnosis not present

## 2016-07-14 DIAGNOSIS — R197 Diarrhea, unspecified: Secondary | ICD-10-CM | POA: Insufficient documentation

## 2016-07-14 DIAGNOSIS — Z79899 Other long term (current) drug therapy: Secondary | ICD-10-CM | POA: Insufficient documentation

## 2016-07-14 DIAGNOSIS — R109 Unspecified abdominal pain: Secondary | ICD-10-CM | POA: Diagnosis present

## 2016-07-14 DIAGNOSIS — Z8507 Personal history of malignant neoplasm of pancreas: Secondary | ICD-10-CM | POA: Diagnosis not present

## 2016-07-14 MED ORDER — SODIUM CHLORIDE 0.9 % IV BOLUS (SEPSIS)
1000.0000 mL | Freq: Once | INTRAVENOUS | Status: AC
  Administered 2016-07-15: 1000 mL via INTRAVENOUS

## 2016-07-14 MED ORDER — ONDANSETRON HCL 4 MG/2ML IJ SOLN
4.0000 mg | Freq: Once | INTRAMUSCULAR | Status: AC
  Administered 2016-07-15: 4 mg via INTRAVENOUS
  Filled 2016-07-14: qty 2

## 2016-07-14 MED ORDER — HYDROMORPHONE HCL 1 MG/ML IJ SOLN
1.0000 mg | Freq: Once | INTRAMUSCULAR | Status: AC
  Administered 2016-07-15: 1 mg via INTRAVENOUS
  Filled 2016-07-14: qty 1

## 2016-07-14 NOTE — Telephone Encounter (Deleted)
Per Dr.Rehmant the patient is to take 1/2 tablet by mouth 30 minutes before a meal. He is to stop taking the Reglan. Patient was called and made aware.

## 2016-07-14 NOTE — ED Triage Notes (Signed)
Pt in by [facility] for abd pain that he states is related to his pancreatic cancer and constipation.  Pt states he has been out of his pain meds since last night.

## 2016-07-14 NOTE — Telephone Encounter (Signed)
Patient L/M he has a Rx for pantoprazole at [facility] - wants to know what is it for -- also said his stomach still hurts real real bad -- wants to know what dr Laural Golden found

## 2016-07-14 NOTE — ED Provider Notes (Signed)
[facility] DEPT Provider Note   CSN: [id number] Arrival date & time: 07/14/16  2321 By signing my name below, I, Dyke Brackett, attest that this documentation has been prepared under the direction and in the presence of Ripley Fraise, MD . Electronically Signed: Dyke Brackett, Scribe. 07/14/2016. 11:50 PM.   History   Chief Complaint Chief Complaint  Patient presents with  . Pain    HPI Nicholas [name] is a 66 y.o. male with a hx of pancreatic cancer who presents to the Emergency Department complaining of acute on chronic, severe abdominal pain which began last night and worsened significantly tonight at 6 pm. He states he has had this pain intermittently every few days for the past 5 months. Per pt, he has been unable to eat or drink due to the pain. Pt reports he has been constipated for a few days and took a laxative today. Per pt, he had frequent, severe diarrhea for four hours tonight. No alleviating or modifying factors noted.  No treatments tried PTA. Pt states he is out of his pain medication. He denies any vomiting, CP, difficulty urinating, or any other associated symptoms at this time.   The history is provided by the patient. No language interpreter was used.   Past Medical History:  Diagnosis Date  . Abdominal pain in male    "was told he has a Mass near pancreas"  . Cancer (HCC)    Skin cancer-ear, skin cancer -leeg,cancer of leg muscle- left., basal cell   . Dyspnea    From previous TB  . Pancreatic cancer ([place]) 12/02/2015  . Pancreatic mass april 2017  . Pre-diabetes   . TB (pulmonary tuberculosis)    3 yrs ago-tx" positive TB test aslso" was told he is not contagious.treatment done for regulat TB.    Patient Active Problem List   Diagnosis Date Noted  . Malignant neoplasm of body of pancreas ([place]) 06/08/2016  . Abdominal pain, epigastric 06/08/2016  . 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 SBO (small bowel obstruction) 01/14/2016  . Nausea &  vomiting 01/14/2016  . Black tarry stools 12/23/2015  . Pancreatic cancer ([place]) 12/02/2015  . Abdominal pain, acute, generalized 11/22/2015  . Abdominal pain 11/22/2015  . Hyponatremia 11/12/2015  . Tobacco use disorder 11/12/2015    Past Surgical History:  Procedure Laterality Date  . BIOPSY  07/09/2016   Procedure: BIOPSY;  Surgeon: Rogene Houston, MD;  Location: [facility];  Service: Endoscopy;;  gastric biopsies of antrum,   . COLONOSCOPY W/ POLYPECTOMY    . ESOPHAGOGASTRODUODENOSCOPY (EGD) WITH PROPOFOL N/A 07/09/2016   Procedure: ESOPHAGOGASTRODUODENOSCOPY (EGD) WITH PROPOFOL;  Surgeon: Rogene Houston, MD;  Location: [facility];  Service: Endoscopy;  Laterality: N/A;  7:30  . EUS N/A 11/25/2015   Procedure: ESOPHAGEAL ENDOSCOPIC ULTRASOUND (EUS) RADIAL;  Surgeon: Milus Banister, MD;  Location: [facility] ENDOS[facility];  Service: Endoscopy;  Laterality: N/A;  . LEG SURGERY Left    " cancer removed from muscle"    Home Medications    Prior to Admission medications   Medication Sig Start Date End Date Taking? Authorizing Provider  aspirin EC 81 MG tablet Take 81 mg by mouth daily.    Historical Provider, MD  DANDELION PO Take 1 capsule by mouth every morning.    Historical Provider, MD  diphenhydrAMINE (SLEEP AID, DIPHENHYDRAMINE,) 25 MG tablet Take 25 mg by mouth at bedtime as needed and may repeat dose one time if needed for sleep.  Historical Provider, MD  Gemcitabine HCl (GEMZAR IV) Inject into the vein. To begin June 6    Historical Provider, MD  hydrocerin (EUCERIN) CREA Apply 1 application topically 2 (two) times daily as needed (For dry skin.).    Historical Provider, MD  lidocaine-prilocaine (EMLA) cream Apply a quarter size amount to port site 1 hour prior to chemo. Do not rub in. Cover with plastic wrap. 12/02/15   Patrici Ranks, MD  lipase/protease/amylase (CREON) 36000 UNITS CPEP capsule Take 1 capsule (36,000 Units total) by mouth 3 (three) times daily before  meals. Patient not taking: Reported on 06/30/2016 04/09/16   Patrici Ranks, MD  metFORMIN (GLUCOPHAGE) 500 MG tablet Take 1 tablet (500 mg total) by mouth daily with breakfast. 06/10/16   Patrici Ranks, MD  Multiple Vitamins-Minerals (MULTIVITAMINS THER. W/MINERALS) TABS tablet Take 1 tablet by mouth daily. Reported on 12/09/2015    Historical Provider, MD  oxyCODONE (OXYCONTIN) 15 mg 12 hr tablet Take 1 tablet (15 mg total) by mouth every 8 (eight) hours. 06/23/16   Baird Cancer, PA-C  Oxycodone HCl 10 MG TABS Take 1 tablet (10 mg total) by mouth every 4 (four) hours as needed. Ok to fill on 05/21/2016, dose change 06/23/16   Baird Cancer, PA-C  PACLitaxel Protein-Bound Part (ABRAXANE IV) Inject into the vein. To begin June 6    Historical Provider, MD  pantoprazole (PROTONIX) 40 MG tablet Take 1 tablet (40 mg total) by mouth daily. 07/09/16   Rogene Houston, MD  senna-docusate (SENOKOT S) 8.6-50 MG tablet Take 2 tablets by mouth daily as needed for mild constipation.     Historical Provider, MD    Family History Family History  Problem Relation Age of Onset  . Stroke Mother   . Pneumonia Father     Social History Social History  Substance Use Topics  . Smoking status: Current Every Day Smoker    Packs/day: 0.50    Years: 30.00    Types: Cigarettes  . Smokeless tobacco: Never Used  . Alcohol use No     Comment: 1-2 beers at night - pt states he can't remember the last time he drank any alcohol    Allergies   Patient has no known allergies.  Review of Systems Review of Systems 10 systems reviewed and all are negative for acute change except as noted in the HPI.   Physical Exam Updated Vital Signs BP 151/93   Pulse 85   Temp 98.4 F (36.9 C) (Oral)   Resp 20   Ht _0  (1.803 m)   Wt 114 lb (51.7 kg)   SpO2 96%   BMI 15.90 kg/m   Physical Exam CONSTITUTIONAL: Ill appearing, cachectic  HEAD: Normocephalic/atraumatic EYES: EOMI/PERRL ENMT: Mucous  membranes dry, poor dentition NECK: supple no meningeal signs SPINE/BACK:entire spine nontender CV: S1/S2 noted, no murmurs/rubs/gallops noted LUNGS: Wheezing bilaterally  ABDOMEN: soft, hyperactive bowel sounds, diffuse moderate tenderness  GU:no cva tenderness NEURO: Pt is awake/alert/appropriate, moves all extremitiesx4.  No facial droop.   EXTREMITIES: pulses normal/equal, full ROM SKIN: warm, color normal PSYCH: anxoius and agitated   ED Treatments / Results  DIAGNOSTIC STUDIES:  Oxygen Saturation is 96% on RA, adequate by my interpretation.    COORDINATION OF CARE:  11:49 PM Discussed treatment plan with pt at bedside and pt agreed to plan.   Labs (all labs ordered are listed, but only abnormal results are displayed) Labs Reviewed  COMPREHENSIVE METABOLIC PANEL - Abnormal; Notable for  the following:       Result Value   Sodium 131 (*)    Chloride 97 (*)    Creatinine, Ser 0.56 (*)    AST 64 (*)    Alkaline Phosphatase 204 (*)    All other components within normal limits  CBC WITH DIFFERENTIAL/PLATELET - Abnormal; Notable for the following:    RBC 3.61 (*)    Hemoglobin 12.8 (*)    HCT 35.6 (*)    MCH 35.5 (*)    RDW 16.6 (*)    Monocytes Absolute 1.1 (*)    All other components within normal limits  LIPASE, BLOOD - Abnormal; Notable for the following:    Lipase 53 (*)    All other components within normal limits  ETHANOL    EKG  EKG Interpretation  Date/Time:  Thursday July 15 2016 00:13:10 EST Ventricular Rate:  78 PR Interval:    QRS Duration: 93 QT Interval:  436 QTC Calculation: 497 R Axis:   -81 Text Interpretation:  Sinus rhythm Probable left atrial enlargement Left anterior fascicular block Anteroseptal infarct, age indeterminate Abnormal ekg Interpretation limited secondary to artifact Confirmed by Christy Gentles  MD, Zerina Hallinan ([id number]) on 07/15/2016 12:26:14 AM       Radiology Dg Abd Acute W/chest  Result Date: 07/15/2016 CLINICAL DATA:  Severe  abdominal pain. Diarrhea. History of tuberculosis and pancreatic cancer. EXAM: DG ABDOMEN ACUTE W/ 1V CHEST COMPARISON:  CT abdomen pelvis 05/05/2016 Chest radiograph 04/09/2016 FINDINGS: Focal linear opacities in the left upper lobe are unchanged. There is a right chest wall Port-A-Cath with tip at the cavoatrial junction. Cardiomediastinal contours are normal. There is no new area of consolidation. No pulmonary edema. No pleural effusion or pneumothorax. There is no free air beneath the diaphragms. Multiple gas-filled loops of nondilated colon and small bowel are seen. No unexpected calculi. IMPRESSION: 1. Unchanged linear opacities in the left upper lobe. No acute airspace disease. 2. No radiographic evidence of small-bowel obstruction. Electronically Signed   By: Ulyses Jarred M.D.   On: 07/15/2016 00:50    Procedures Procedures (including critical care time)  Medications Ordered in ED Medications  HYDROmorphone (DILAUDID) injection 1 mg (1 mg Intravenous Given 07/15/16 0004)  ondansetron (ZOFRAN) injection 4 mg (4 mg Intravenous Given 07/15/16 0003)  sodium chloride 0.9 % bolus 1,000 mL (0 mLs Intravenous Stopped 07/15/16 0049)  HYDROmorphone (DILAUDID) injection 1 mg (1 mg Intravenous Given 07/15/16 0044)  ondansetron (ZOFRAN) injection 4 mg (4 mg Intravenous Given 07/15/16 0044)     Initial Impression / Assessment and Plan / ED Course  I have reviewed the triage vital signs and the nursing notes.  Pertinent labs & imaging results that were available during my care of the patient were reviewed by me and considered in my medical decision making (see chart for details).  Clinical Course     1:00 AM Pt reporting some improvement Xray does not reveal signs of acute obstruction Labs near baseline 2:21 AM Pt stable Pain improved abd soft with minimal tenderness on repeat exam He is not vomiting and he is taking PO fluids He admits this is an ongoing/recurrent pain Narcotic database  reviewed and considered in decision making He reports running out of pain meds, will need to call his physician for refill Imaging negative Advised to call his oncologist later this morning.  He is scheduled to have outpatient CT imaging.  I don't feel he requires emergent imaging at this time BP 138/72 (BP Location: Right Arm)  Pulse 81   Temp 98.4 F (36.9 C) (Oral)   Resp 16   Ht _0  (1.803 m)   Wt 51.7 kg   SpO2 98%   BMI 15.90 kg/m   Final Clinical Impressions(s) / ED Diagnoses   Final diagnoses:  Generalized abdominal pain    New Prescriptions New Prescriptions   No medications on file  I personally performed the services described in this documentation, which was scribed in my presence. The recorded information has been reviewed and is accurate.        Ripley Fraise, MD 07/15/16 Rogene Houston

## 2016-07-14 NOTE — ED Notes (Addendum)
Pt reports drinking bowel prep today due to no BM x 2 days and passed hard stool, now having diarrhea. Conts to have abdominal pain

## 2016-07-15 ENCOUNTER — Ambulatory Visit (HOSPITAL_COMMUNITY): Payer: Medicaid Other

## 2016-07-15 ENCOUNTER — Ambulatory Visit (HOSPITAL_COMMUNITY): Payer: Medicaid Other | Admitting: Hematology & Oncology

## 2016-07-15 ENCOUNTER — Telehealth (HOSPITAL_COMMUNITY): Payer: Self-pay | Admitting: *Deleted

## 2016-07-15 ENCOUNTER — Other Ambulatory Visit (HOSPITAL_COMMUNITY): Payer: Self-pay | Admitting: Oncology

## 2016-07-15 ENCOUNTER — Telehealth (INDEPENDENT_AMBULATORY_CARE_PROVIDER_SITE_OTHER): Payer: Self-pay | Admitting: Internal Medicine

## 2016-07-15 DIAGNOSIS — G893 Neoplasm related pain (acute) (chronic): Secondary | ICD-10-CM

## 2016-07-15 DIAGNOSIS — C251 Malignant neoplasm of body of pancreas: Secondary | ICD-10-CM

## 2016-07-15 LAB — COMPREHENSIVE METABOLIC PANEL
ALBUMIN: 3.9 g/dL (ref 3.5–5.0)
ALT: 41 U/L (ref 17–63)
AST: 64 U/L — AB (ref 15–41)
Alkaline Phosphatase: 204 U/L — ABNORMAL HIGH (ref 38–126)
Anion gap: 7 (ref 5–15)
BUN: 9 mg/dL (ref 6–20)
CHLORIDE: 97 mmol/L — AB (ref 101–111)
CO2: 27 mmol/L (ref 22–32)
Calcium: 9.1 mg/dL (ref 8.9–10.3)
Creatinine, Ser: 0.56 mg/dL — ABNORMAL LOW (ref 0.61–1.24)
GFR calc Af Amer: >60 mL/min (ref >60)
GFR calc non Af Amer: >60 mL/min (ref >60)
GLUCOSE: 98 mg/dL (ref 65–99)
Potassium: 4.2 mmol/L (ref 3.5–5.1)
Sodium: 131 mmol/L — ABNORMAL LOW (ref 135–145)
Total Bilirubin: 0.5 mg/dL (ref 0.3–1.2)
Total Protein: 7.4 g/dL (ref 6.5–8.1)

## 2016-07-15 LAB — CBC WITH DIFFERENTIAL/PLATELET
Basophils Absolute: 0 10*3/uL (ref 0.0–0.1)
Basophils Relative: 0 %
EOS PCT: 4 %
Eosinophils Absolute: 0.4 10*3/uL (ref 0.0–0.7)
HCT: 35.6 % — ABNORMAL LOW (ref 39.0–52.0)
Hemoglobin: 12.8 g/dL — ABNORMAL LOW (ref 13.0–17.0)
LYMPHS ABS: 1.8 10*3/uL (ref 0.7–4.0)
LYMPHS PCT: 19 %
MCH: 35.5 pg — AB (ref 26.0–34.0)
MCHC: 36 g/dL (ref 30.0–36.0)
MCV: 98.6 fL (ref 78.0–100.0)
MONO ABS: 1.1 10*3/uL — AB (ref 0.1–1.0)
MONOS PCT: 11 %
Neutro Abs: 6.1 10*3/uL (ref 1.7–7.7)
Neutrophils Relative %: 66 %
Platelets: 161 10*3/uL (ref 150–400)
RBC: 3.61 MIL/uL — ABNORMAL LOW (ref 4.22–5.81)
RDW: 16.6 % — AB (ref 11.5–15.5)
WBC: 9.3 10*3/uL (ref 4.0–10.5)

## 2016-07-15 LAB — ETHANOL: Alcohol, Ethyl (B): <5 mg/dL (ref <5)

## 2016-07-15 LAB — LIPASE, BLOOD: Lipase: 53 U/L — ABNORMAL HIGH (ref 11–51)

## 2016-07-15 MED ORDER — HYDROMORPHONE HCL 1 MG/ML IJ SOLN
1.0000 mg | Freq: Once | INTRAMUSCULAR | Status: AC
  Administered 2016-07-15: 1 mg via INTRAVENOUS
  Filled 2016-07-15: qty 1

## 2016-07-15 MED ORDER — OXYCODONE HCL 10 MG PO TABS: 10.0000 mg | ORAL_TABLET | ORAL | 0 refills | Status: DC | PRN

## 2016-07-15 MED ORDER — OXYCODONE HCL ER 15 MG PO T12A: 15.0000 mg | EXTENDED_RELEASE_TABLET | Freq: Three times a day (TID) | ORAL | 0 refills | Status: DC

## 2016-07-15 MED ORDER — ONDANSETRON HCL 4 MG/2ML IJ SOLN
4.0000 mg | Freq: Once | INTRAMUSCULAR | Status: AC
  Administered 2016-07-15: 4 mg via INTRAVENOUS
  Filled 2016-07-15: qty 2

## 2016-07-15 NOTE — ED Notes (Signed)
Pt unable to find ride home. Will wait in the waiting room until able to find ride

## 2016-07-15 NOTE — ED Notes (Signed)
Returned from xray

## 2016-07-15 NOTE — Telephone Encounter (Signed)
Notes Recorded by Grayland Ormond, LPN on [id number] at [id number] AM EST A message was left on the patient's voicemail about the medication and that Dr.Rehman had tried to call him yesterday and earlier today. Patient was ask to call our office back. ------  Notes Recorded by Grayland Ormond, LPN on [id number] at [id number] AM EST Rx has been called to [facility]. Patient was called and made aware. ------  Notes Recorded by Rogene Houston, MD on 07/15/2016 at 10:02 AM EST Unable to reach patient. Called in yesterday and today. He has eosinophilic gastritis. Will treat him with prednisone 30 mg daily for 5 days and drop dose by 5 mg every 5 days. Discussed with Kirby Crigler PAC. Oncology is fine with prednisone short-term. Singulair 10 mg by mouth daily. Please call prescription inpatient. Patient will need office visit in 4 weeks.

## 2016-07-15 NOTE — Telephone Encounter (Signed)
Attempted to call patient this morning to follow up and see if his symptoms were addressed and if they have resolved.  No answer. Message left on machine for him to give [place] a call if he needs anything.

## 2016-07-15 NOTE — Telephone Encounter (Signed)
Patient called, he stated that he has had a lot of trouble with his stomach since his procedure.  He stated that he went to the ER last night because it  was so bad.  He said they gave him enough pain medication to knock him out and he wants to know what his results are - he may end up going back to the hospital.  Stated that he hasnt slept since Tuesday.  [phone]

## 2016-07-15 NOTE — ED Notes (Signed)
Patient transported to X-ray 

## 2016-07-15 NOTE — Discharge Instructions (Signed)
  SEEK IMMEDIATE MEDICAL ATTENTION IF: The pain does not go away or becomes severe, particularly over the next 8-12 hours.  A temperature above 100.4F develops.  Repeated vomiting occurs (multiple episodes).  The pain becomes localized to portions of the abdomen.  Blood is being passed in stools or vomit (bright red or black tarry stools).  Return also if you develop chest pain, difficulty breathing, dizziness or fainting, or become confused, poorly responsive, or inconsolable.  

## 2016-07-15 NOTE — Telephone Encounter (Signed)
Patient was called and given the report of Eosinophilic Gastritis and that medication had been called into [place]. Patient says that his pain is bad , he cannot eat nor sleep. He would like to talk with Dr.Rehman.

## 2016-07-15 NOTE — ED Notes (Signed)
Pt tolerating sips of ginger ale.

## 2016-07-15 NOTE — Telephone Encounter (Signed)
Patient's sister was called and biopsy results reviewed. Patient's pain is not secondary to eosinophilic gastritis. Since pain medication is not controlling his pain effectively he may be a candidate for celiac ganglion block. Will discuss with Dr. Whitney Muse.

## 2016-07-15 NOTE — Telephone Encounter (Signed)
Dr.Rehman was made aware that the patient called and had questions.

## 2016-07-15 NOTE — ED Notes (Signed)
Pt assisted up to [facility] small watery yellow green BM

## 2016-07-15 NOTE — Telephone Encounter (Signed)
-----   Message from Baird Cancer, PA-C sent at 07/15/2016  4:07 PM EST ----- I have refilled his pain medications.  Unfortunately, neither are due to be refilled at this time.  Rxs are printed.  Oxycontin and Oxycodone can be filled on 1/18.  Review constipation sheet with him for directions.  TK ----- Message ----- From: Donetta Potts, RN Sent: 07/15/2016   1:04 PM To: Baird Cancer, PA-C  Patient called last night to inform us that he was going to ER for severe abdominal pain and diarrhea.  Patient returned call and said he did go and after pain medications and fluids he was a little better.  He states that the pain is back across his entire abdomen and into his back, cramping in nature, feels like he has to have a BM but cannot.  Took something he had left over from colonoscopy? To help clear his bowels, he had some diarrhea after that but still is having pain.  His pain is 10/10.  No fever, no vomiting.  States that this pain all started after having his endoscopy done on Friday last week.

## 2016-07-19 ENCOUNTER — Telehealth (HOSPITAL_COMMUNITY): Payer: Self-pay | Admitting: *Deleted

## 2016-07-19 ENCOUNTER — Other Ambulatory Visit (HOSPITAL_COMMUNITY): Payer: Self-pay | Admitting: Oncology

## 2016-07-19 NOTE — Telephone Encounter (Signed)
-----   Message from Epifanio Lesches sent at 07/19/2016  2:00 PM EST ----- Pt wants pain meds refilled early. Says he is in a lot of pain

## 2016-07-22 ENCOUNTER — Ambulatory Visit (HOSPITAL_COMMUNITY): Payer: Medicaid Other

## 2016-07-22 ENCOUNTER — Ambulatory Visit (HOSPITAL_COMMUNITY): Payer: Medicaid Other | Admitting: Hematology & Oncology

## 2016-07-28 ENCOUNTER — Encounter (HOSPITAL_BASED_OUTPATIENT_CLINIC_OR_DEPARTMENT_OTHER): Payer: Medicaid Other | Admitting: Adult Health

## 2016-07-28 ENCOUNTER — Encounter (HOSPITAL_COMMUNITY): Payer: Medicaid Other | Attending: Hematology & Oncology

## 2016-07-28 VITALS — BP 131/87 | HR 115 | Temp 98.2°F | Resp 20

## 2016-07-28 DIAGNOSIS — B370 Candidal stomatitis: Secondary | ICD-10-CM

## 2016-07-28 DIAGNOSIS — C251 Malignant neoplasm of body of pancreas: Secondary | ICD-10-CM

## 2016-07-28 DIAGNOSIS — G893 Neoplasm related pain (acute) (chronic): Secondary | ICD-10-CM | POA: Diagnosis not present

## 2016-07-28 DIAGNOSIS — R143 Flatulence: Secondary | ICD-10-CM | POA: Insufficient documentation

## 2016-07-28 DIAGNOSIS — K5900 Constipation, unspecified: Secondary | ICD-10-CM | POA: Insufficient documentation

## 2016-07-28 DIAGNOSIS — Z720 Tobacco use: Secondary | ICD-10-CM | POA: Diagnosis not present

## 2016-07-28 DIAGNOSIS — Z8611 Personal history of tuberculosis: Secondary | ICD-10-CM | POA: Insufficient documentation

## 2016-07-28 DIAGNOSIS — K7689 Other specified diseases of liver: Secondary | ICD-10-CM | POA: Insufficient documentation

## 2016-07-28 DIAGNOSIS — E876 Hypokalemia: Secondary | ICD-10-CM | POA: Insufficient documentation

## 2016-07-28 DIAGNOSIS — C258 Malignant neoplasm of overlapping sites of pancreas: Secondary | ICD-10-CM | POA: Insufficient documentation

## 2016-07-28 DIAGNOSIS — C259 Malignant neoplasm of pancreas, unspecified: Secondary | ICD-10-CM

## 2016-07-28 DIAGNOSIS — K869 Disease of pancreas, unspecified: Secondary | ICD-10-CM | POA: Insufficient documentation

## 2016-07-28 MED ORDER — FLUCONAZOLE 100 MG PO TABS: 100.0000 mg | ORAL_TABLET | Freq: Every day | ORAL | 0 refills | Status: DC

## 2016-07-28 NOTE — Progress Notes (Signed)
[facility]  Progress Note  Patient Care Team: Pcp Not In System as PCP - General  CHIEF COMPLAINTS/PURPOSE OF CONSULTATION:  Adenocarcinoma of body/tail of pancreas, T4N0Mx History of TB, treated by [facility] History of MAC Weight Loss History Hyponatremia Tobacco Abuse    Pancreatic cancer ([place])   11/11/2015 - 11/13/2015 Hospital Admission    abdominal pain, cramping, diarrhea, weight loss, hyponatremia CT pancreatic body mass      11/12/2015 Imaging    4.7 cm mass of pancreas to L of midline, involvement of several vascular structures also possibly the distal duodenum      11/21/2015 - 11/25/2015 Hospital Admission    EUS on 5/23, hyponatremia      11/22/2015 Imaging    Diffuse mesenteric edema, large hypo enhancing pancreatic mass, abutment of celiac axis, SMA, encasement of splenic artery, occludes splenic vein, compresses porta splenic confluence      11/25/2015 Procedure    EUS Dr. Ardis Hughs, irreg mass in pancreatic body/tail. 4.2 cm, involvement of major blood vessels not appreciated due to size of mass      11/25/2015 Pathology Results    malignant cells c/w adenocarcinoma      12/04/2015 Miscellaneous    Acid Fast Smear negative      12/04/2015 Imaging    No convincing evidence of pulm mets, improvement in LLL, RUL angular nodular thickening seen on comparison CT from 2013, residual cavitation remains at the L lung base, one nodule oblique fissue on R 76m new      12/08/2015 Procedure    Port a cath      12/09/2015 -  Chemotherapy    Gemzar/Abraxane days 1, 8, and 15 every 28 days.      12/12/2015 Imaging    MRI abd- Limited MRI, D/C prior to completion at patient request. Large infiltrative 8.1 x 4.7 x 5.8 cm pancreatic mass centered at the junction of the pancreatic body and tail, consistent with known primary pancreatic adenocarc      12/23/2015 Treatment Plan Change    Treatment deferred x 7 days due to thrombocytopenia      01/14/2016 Imaging    CT abd/pelvis- Mass in the body/ tail of the pancreas consistent with known pancreatic carcinoma. Likely metastases to the liver demonstrating some progression since previous study.      02/26/2016 Treatment Plan Change    Day 15 deleted from treatment plan.  Chemotherapy is now days 1, 8 every 28 days.      05/06/2016 Imaging    CT abd/pelvis- 1. Mixed response to therapy. 2. Decrease in size of tumor within the tail of pancreas. 3. Several new liver metastases are identified. Previously noted liver metastasis have resolved or calcified.       HISTORY OF PRESENTING ILLNESS:  Nicholas VANHORN89y.o. male is here for scheduled treatment for Stage IV pancreatic cancer. He has chronic pulmonary MAC; he is a smoker.   He is seen in an exam room, pacing the floor and clutching his abdomen.  He tells me "I just need to get out of here and go home and rest.  My stomach is hurting so bad that I feel like I'm going to throw up."  He wanted to get some fresh air, so a window was opened in the exam room and Mr. LBroskiwas seated next to it.    He is very angry, particularly about "having to wait 4 days to get my pain medicines  last time because the prescription said I couldn't fill it sooner."  He endorses having plenty of pain medications at present.  He tells me he has not slept in 4 days; he tells me he has lost 4 lbs in the past week. He cannot eat.  He feels nauseated often and has poor appetite.  He went to the ED on 07/14/16 for abdominal pain;   When I offered him some options to help provide him some relief, he became more angry and did not want me to speak anymore; he just wanted to be left alone and go home.  He is declining to stay for his treatment today and wants to reschedule for one day next week, "when maybe I can get my stomach under control by then."     MEDICAL HISTORY:  Past Medical History:  Diagnosis Date  . Abdominal pain in male    "was told he has a  Mass near pancreas"  . Cancer (HCC)    Skin cancer-ear, skin cancer -leeg,cancer of leg muscle- left., basal cell   . Dyspnea    From previous TB  . Pancreatic cancer ([place]) 12/02/2015  . Pancreatic mass april 2017  . Pre-diabetes   . TB (pulmonary tuberculosis)    3 yrs ago-tx" positive TB test aslso" was told he is not contagious.treatment done for regulat TB.    SURGICAL HISTORY: Past Surgical History:  Procedure Laterality Date  . BIOPSY  07/09/2016   Procedure: BIOPSY;  Surgeon: Rogene Houston, MD;  [facility];  Service: Endoscopy;;  gastric biopsies of antrum,   . COLONOSCOPY W/ POLYPECTOMY    . ESOPHAGOGASTRODUODENOSCOPY (EGD) WITH PROPOFOL N/A 07/09/2016   Procedure: ESOPHAGOGASTRODUODENOSCOPY (EGD) WITH PROPOFOL;  Surgeon: Rogene Houston, MD;  Location[facility];  Service: Endoscopy;  Laterality: N/A;  7:30  . EUS N/A 11/25/2015   Procedure: ESOPHAGEAL ENDOSCOPIC ULTRASOUND (EUS) RADIAL;  Surgeon: Milus Banister, MD;  Location: [facility];  Service: Endoscopy;  Laterality: N/A;  . LEG SURGERY Left    " cancer removed from muscle"    SOCIAL HISTORY: Social History   Social History  . Marital status: Single    Spouse name: N/A  . Number of children: N/A  . Years of education: N/A   Occupational History  . Not on file.   Social History Main Topics  . Smoking status: Current Every Day Smoker    Packs/day: 0.50    Years: 30.00    Types: Cigarettes  . Smokeless tobacco: Never Used  . Alcohol use No     Comment: 1-2 beers at night - pt states he can't remember the last time he drank any alcohol  . Drug use: No  . Sexual activity: No   Other Topics Concern  . Not on file   Social History Narrative  . No narrative on file   Single. He has been engaged 4 times. No children He has cut his smoking back since he had tuberculosis. He has smoked more with recent pain. Sometimes 1 ppd, "it varies". Since last Thursday, he has had 2 beers. It does not  taste right. He has never had acid reflux before, but now he does. He was in the [employer]. Worked in Office manager, [place], and the post office.   FAMILY HISTORY: Family History  Problem Relation Age of Onset  . Stroke Mother   . Pneumonia Father    Mother living 20 years old at [employer] and  rehabilitation center.  Father deceased at 87 years old of pneumonia. He drank and drank a lot. [employer] English as a second language teacher. Had a massive heart attack at 54 yo 1 sister, lives in [place]:   No Known Allergies   MEDICATIONS:  Current Outpatient Prescriptions  Medication Sig Dispense Refill  . aspirin EC 81 MG tablet Take 81 mg by mouth daily.    Marland Kitchen DANDELION PO Take 1 capsule by mouth every morning.    . diphenhydrAMINE (SLEEP AID, DIPHENHYDRAMINE,) 25 MG tablet Take 25 mg by mouth at bedtime as needed and may repeat dose one time if needed for sleep.    . fluconazole (DIFLUCAN) 100 MG tablet Take 1 tablet (100 mg total) by mouth daily. 14 tablet 0  . Gemcitabine HCl (GEMZAR IV) Inject into the vein. To begin June 6    . hydrocerin (EUCERIN) CREA Apply 1 application topically 2 (two) times daily as needed (For dry skin.).    Marland Kitchen lidocaine-prilocaine (EMLA) cream Apply a quarter size amount to port site 1 hour prior to chemo. Do not rub in. Cover with plastic wrap. 30 g 3  . lipase/protease/amylase (CREON) 36000 UNITS CPEP capsule Take 1 capsule (36,000 Units total) by mouth 3 (three) times daily before meals. (Patient not taking: Reported on 06/30/2016) 180 capsule 3  . metFORMIN (GLUCOPHAGE) 500 MG tablet Take 1 tablet (500 mg total) by mouth daily with breakfast. 30 tablet 1  . Multiple Vitamins-Minerals (MULTIVITAMINS THER. W/MINERALS) TABS tablet Take 1 tablet by mouth daily. Reported on 12/09/2015    . oxyCODONE (OXYCONTIN) 15 mg 12 hr tablet Take 1 tablet (15 mg total) by mouth every 8 (eight) hours. 90 tablet 0  . Oxycodone HCl 10 MG TABS Take 1 tablet (10 mg total) by mouth every  4 (four) hours as needed. Ok to fill on 05/21/2016, dose change 120 tablet 0  . PACLitaxel Protein-Bound Part (ABRAXANE IV) Inject into the vein. To begin June 6    . pantoprazole (PROTONIX) 40 MG tablet Take 1 tablet (40 mg total) by mouth daily. 60 tablet 5  . senna-docusate (SENOKOT S) 8.6-50 MG tablet Take 2 tablets by mouth daily as needed for mild constipation.      No current facility-administered medications for this visit.     Review of Systems  Constitutional: Positive for malaise/fatigue and weight loss.       Poor appetite  HENT: Negative.   Eyes: Negative.   Respiratory: Positive for shortness of breath.        Chronic SOB  Gastrointestinal: Positive for abdominal pain and nausea. Negative for blood in stool and melena.       Sharp pains to abdomen   Genitourinary: Negative.   Musculoskeletal: Negative.   Skin: Negative.   Neurological: Negative.   Endo/Heme/Allergies: Negative.   Psychiatric/Behavioral: The patient has insomnia.   All other systems reviewed and are negative. 14 point ROS was done and is otherwise as detailed above or in HPI   PHYSICAL EXAMINATION: ECOG PERFORMANCE STATUS: 2 - Symptomatic, <50% confined to bed     Physical Exam  Constitutional: He is oriented to person, place, and time. He appears distressed.  Patient is very thin. Seen clutching his abdomen and pacing in exam room.  Agitated.  Did allow me to examine him.   HENT:  Head: Normocephalic and atraumatic.  Mouth/Throat: Oropharyngeal exudate (consistent with oropharyngeal thrush) and posterior oropharyngeal erythema present.  Hoarse voice   Eyes: Conjunctivae and EOM are normal. Pupils are  equal, round, and reactive to light. Right eye exhibits no discharge. Left eye exhibits no discharge. No scleral icterus.  Neck: Normal range of motion. Neck supple. No JVD present. No tracheal deviation present. No thyromegaly present.  Cardiovascular: Normal rate, regular rhythm and normal heart  sounds.  Exam reveals no gallop and no friction rub.   No murmur heard. Pulmonary/Chest: Effort normal. No stridor. He has wheezes (diffuse expiratory wheezes ).  Abdominal: Soft. He exhibits no distension and no mass. There is no tenderness. There is no rebound and no guarding.  No abdominal pain on palpation; no distension or rebound tenderness. (abdomen examined while patient was distracted talking)   Musculoskeletal: Normal range of motion. He exhibits no edema.  Lymphadenopathy:    He has no cervical adenopathy.  Neurological: He is alert and oriented to person, place, and time. Gait normal.  Skin: Skin is warm and dry.  Psychiatric:  Agitated & angry  Nursing note and vitals reviewed.   LABORATORY DATA:  I have reviewed the data as listed Lab Results  Component Value Date   WBC 9.3 07/14/2016   HGB 12.8 (L) 07/14/2016   HCT 35.6 (L) 07/14/2016   MCV 98.6 07/14/2016   PLT 161 07/14/2016   CMP     Component Value Date/Time   NA 131 (L) 07/14/2016 2356   K 4.2 07/14/2016 2356   CL 97 (L) 07/14/2016 2356   CO2 27 07/14/2016 2356   GLUCOSE 98 07/14/2016 2356   BUN 9 07/14/2016 2356   CREATININE 0.56 (L) 07/14/2016 2356   CALCIUM 9.1 07/14/2016 2356   PROT 7.4 07/14/2016 2356   ALBUMIN 3.9 07/14/2016 2356   AST 64 (H) 07/14/2016 2356   ALT 41 07/14/2016 2356   ALKPHOS 204 (H) 07/14/2016 2356   BILITOT 0.5 07/14/2016 2356   GFRNONAA >60 07/14/2016 2356   GFRAA >60 07/14/2016 2356    Results for YORDIN, RHODA (MRN [id number]) as of 06/06/2016 17:08  Ref. Range 03/25/2016 09:51 04/01/2016 10:58 04/22/2016 11:16 05/14/2016 10:41 06/03/2016 09:22  CA 19-9 Latest Ref Range: 0 - 35 U/mL 300 (H) 228 (H) 149 (H) 141 (H) 126 (H)    Results for MEAGAN, SPEASE (MRN [id number]) as of 06/06/2016 17:08  Ref. Range 05/14/2016 12:36  Folate Latest Ref Range: >5.9 ng/mL 53.1  Vitamin B12 Latest Ref Range: 180 - 914 pg/mL 1,086 (H)     PATHOLOGY:     RADIOGRAPHIC  STUDIES: I have personally reviewed the radiological images as listed and agreed with the findings in the report.  Study Result   CLINICAL DATA:  Restaging pancreas cancer  EXAM: CT ABDOMEN AND PELVIS WITH CONTRAST  TECHNIQUE: Multidetector CT imaging of the abdomen and pelvis was performed using the standard protocol following bolus administration of intravenous contrast.  CONTRAST:  26m ISOVUE-300 IOPAMIDOL (ISOVUE-300) INJECTION 61%, 101mISOVUE-300 IOPAMIDOL (ISOVUE-300) INJECTION 61%  COMPARISON:  01/14/2016  FINDINGS: Lower chest: No acute abnormality.  Hepatobiliary: Within the posterior right lobe of liver there is a 11 mm low-attenuation lesion, 28 of series 2. New from previous exam. Treated lesion within segment 5 of the liver measures 3 mm in appears calcified. Previously 10 mm and noncalcified. New lesion within the lateral right lobe of liver measures 1.5 cm, image 16 of series 2. Also new from previous exam is a 8 mm low-attenuation structure, image 14 of series 2. 5 mm lesion within the medial segment of left lobe of liver has resolved in the interval. The gallbladder  is normal. No biliary dilatation.  Pancreas: Tumor involving the tail of pancreas measures 4.7 x 1.9 cm, image 20 of series 2. Previously 6.7 x 3.0 cm.  Spleen: Normal in size without focal abnormality.  Adrenals/Urinary Tract: Normal appearance of the right adrenal gland. Enlargement of the left adrenal gland appears similar to the previous exam, image 15 of series 2. The kidneys are unremarkable. There is mild ventral bladder wall thickening.  Stomach/Bowel: Stomach is within normal limits. Appendix appears normal. No evidence of bowel wall thickening, distention, or inflammatory changes.  Vascular/Lymphatic: Aortic atherosclerosis. No aneurysm. No upper abdominal adenopathy. There is no pelvic or inguinal adenopathy.  Reproductive: Prostate is unremarkable.  Other: A  small amount of ascites is identified within the dependent portion of the pelvis. Decreased perihepatic ascites.  Musculoskeletal: Degenerative disc disease noted within the lumbar spine. No aggressive lytic or sclerotic bone lesions.  IMPRESSION: 1. Mixed response to therapy. 2. Decrease in size of tumor within the tail of pancreas. 3. Several new liver metastases are identified. Previously noted liver metastasis have resolved or calcified.   Electronically Signed   By: Kerby Moors M.D.   On: 05/05/2016 15:54        EUS:        ASSESSMENT & PLAN:  Adenocarcinoma of body/tail of pancreas, T4N0M1, Stage IV disease History of TB, treated by [facility] MAC Weight Loss History Hyponatremia Tobacco Abuse Cancer related pain Hypokalemia Hyperglycemia Macrocytosis End of life discussion/goals of care  Stage IV pancreatic cancer:  -He presents for ongoing Abraxane/Gemzar, but is declining treatment today d/t abdominal pain and nausea.  Recent CT imaging showed positive response to therapy of all initial disease. Several new lesions were noted in the liver, per Dr. Donald Pore note, she is not convinced that this is pancreatic cancer and has previously discussed with the patient.  He is afebrile; no leukocytosis, but history of MAC noted.  Patient requests to return to cancer center next week for his chemotherapy; we have rescheduled his treatment.  I will see him next week for further discussion.  I will plan on having a goals of care discussion with him next week. Attempts to have these discussions have been unsuccessful in the past; we will continue working with him.  Will also consider a referral to Palliative Medicine if appropriate as well.  He will need restaging imaging soon, potentially after his next dose of chemotherapy.    Abdominal pain, Nausea, Weight loss:  -Attempted to have a plan of care discussion with patient today. However, he was too  agitated to have this discussion today.  His reported abdominal pain is incongruent to his physical exam.  EGD on 07/09/16 did show erosive esophagitis.  I offered to admit him to the hospital for hydration, pain control, and nausea management but he adamantly declined.  I offered to obtain IV access with him here in the clinic and give him IV medications to help his current symptoms; again he declined.  He left the clinic ambulatory today. I gave him strict instructions to go to the ER or call an ambulance if his abdominal pain or symptoms worsen before he returns to cancer center.  He agreed with this plan.    Oral candidiasis:  -He does have significant oral and posterior pharyngeal candidiasis on exam today.  This could be contributing to his hoarse voice, which he complains of today.  I will start him on a 2-week course of Diflucan 100 mg; prescription  e-prescribed to [facility] per patient request.      Dispo: -Return to cancer center next week for rescheduled chemotherapy.   No orders of the defined types were placed in this encounter.    All questions were answered. The patient knows to call the clinic with any problems, questions or concerns.  Plan of care discussed with Dr. Ancil Linsey, who agrees with the above aforementioned.    Mike Craze, NP [place] [phone]

## 2016-07-28 NOTE — Progress Notes (Signed)
Presents with multiple complaints (n/v, abd pain, insomnia).  Pt does not wish to be treated today, stating, "I just need to go back home and lay down.  I'll come back next week.".  Pt seen by G. Renato Battles, NP - tx deferred x 1 week.

## 2016-07-29 ENCOUNTER — Ambulatory Visit (HOSPITAL_COMMUNITY): Payer: Medicaid Other

## 2016-08-02 ENCOUNTER — Telehealth (INDEPENDENT_AMBULATORY_CARE_PROVIDER_SITE_OTHER): Payer: Self-pay | Admitting: *Deleted

## 2016-08-02 NOTE — Telephone Encounter (Signed)
Patient called the office and states that his stomach is beginning to feel like it did. He is requesting more prednisone.  Per Dr.Rehman call in Prednisone - patient will start with 30 mg for 1 week , then decrease by 5 mg weekly. After completing 5 mg he will stop the medication. Dr.Rehman also ask that the patient be called in Singulair 10 mg take 1 po d daily #30 1 refill. The prescriptions were called to [facility]. Patient was called and made aware.

## 2016-08-04 ENCOUNTER — Encounter (HOSPITAL_COMMUNITY): Payer: Self-pay | Admitting: Adult Health

## 2016-08-04 ENCOUNTER — Encounter (HOSPITAL_BASED_OUTPATIENT_CLINIC_OR_DEPARTMENT_OTHER): Payer: Medicaid Other | Admitting: Adult Health

## 2016-08-04 ENCOUNTER — Encounter: Payer: Self-pay | Admitting: Dietician

## 2016-08-04 ENCOUNTER — Encounter (HOSPITAL_BASED_OUTPATIENT_CLINIC_OR_DEPARTMENT_OTHER): Payer: Medicaid Other

## 2016-08-04 DIAGNOSIS — E876 Hypokalemia: Secondary | ICD-10-CM | POA: Diagnosis present

## 2016-08-04 DIAGNOSIS — K5900 Constipation, unspecified: Secondary | ICD-10-CM | POA: Diagnosis present

## 2016-08-04 DIAGNOSIS — R109 Unspecified abdominal pain: Secondary | ICD-10-CM | POA: Diagnosis not present

## 2016-08-04 DIAGNOSIS — Z8611 Personal history of tuberculosis: Secondary | ICD-10-CM | POA: Diagnosis present

## 2016-08-04 DIAGNOSIS — C251 Malignant neoplasm of body of pancreas: Secondary | ICD-10-CM

## 2016-08-04 DIAGNOSIS — E871 Hypo-osmolality and hyponatremia: Secondary | ICD-10-CM | POA: Diagnosis not present

## 2016-08-04 DIAGNOSIS — C258 Malignant neoplasm of overlapping sites of pancreas: Secondary | ICD-10-CM | POA: Diagnosis present

## 2016-08-04 DIAGNOSIS — K869 Disease of pancreas, unspecified: Secondary | ICD-10-CM | POA: Diagnosis not present

## 2016-08-04 DIAGNOSIS — K7689 Other specified diseases of liver: Secondary | ICD-10-CM | POA: Diagnosis present

## 2016-08-04 DIAGNOSIS — R143 Flatulence: Secondary | ICD-10-CM | POA: Diagnosis present

## 2016-08-04 LAB — CBC WITH DIFFERENTIAL/PLATELET
BASOS PCT: 0 %
Basophils Absolute: 0 10*3/uL (ref 0.0–0.1)
EOS PCT: 2 %
Eosinophils Absolute: 0.3 10*3/uL (ref 0.0–0.7)
HEMATOCRIT: 36.2 % — AB (ref 39.0–52.0)
Hemoglobin: 13 g/dL (ref 13.0–17.0)
Lymphocytes Relative: 9 %
Lymphs Abs: 1.4 10*3/uL (ref 0.7–4.0)
MCH: 35.2 pg — AB (ref 26.0–34.0)
MCHC: 35.9 g/dL (ref 30.0–36.0)
MCV: 98.1 fL (ref 78.0–100.0)
MONO ABS: 1.2 10*3/uL — AB (ref 0.1–1.0)
MONOS PCT: 8 %
NEUTROS PCT: 81 %
Neutro Abs: 12.7 10*3/uL — ABNORMAL HIGH (ref 1.7–7.7)
PLATELETS: 168 10*3/uL (ref 150–400)
RBC: 3.69 MIL/uL — ABNORMAL LOW (ref 4.22–5.81)
RDW: 15.4 % (ref 11.5–15.5)
WBC: 15.6 10*3/uL — ABNORMAL HIGH (ref 4.0–10.5)

## 2016-08-04 LAB — COMPREHENSIVE METABOLIC PANEL
ALK PHOS: 320 U/L — AB (ref 38–126)
ALT: 34 U/L (ref 17–63)
AST: 44 U/L — ABNORMAL HIGH (ref 15–41)
Albumin: 3.3 g/dL — ABNORMAL LOW (ref 3.5–5.0)
Anion gap: 8 (ref 5–15)
BILIRUBIN TOTAL: 0.5 mg/dL (ref 0.3–1.2)
BUN: 8 mg/dL (ref 6–20)
CALCIUM: 8.6 mg/dL — AB (ref 8.9–10.3)
CO2: 26 mmol/L (ref 22–32)
CREATININE: 0.6 mg/dL — AB (ref 0.61–1.24)
Chloride: 90 mmol/L — ABNORMAL LOW (ref 101–111)
GFR calc non Af Amer: >60 mL/min (ref >60)
Glucose, Bld: 168 mg/dL — ABNORMAL HIGH (ref 65–99)
Potassium: 4.3 mmol/L (ref 3.5–5.1)
Sodium: 124 mmol/L — ABNORMAL LOW (ref 135–145)
TOTAL PROTEIN: 6.5 g/dL (ref 6.5–8.1)

## 2016-08-04 MED ORDER — PANCRELIPASE (LIP-PROT-AMYL) 36000-114000 UNITS PO CPEP: 36000.0000 [IU] | ORAL_CAPSULE | Freq: Three times a day (TID) | ORAL | 6 refills | Status: AC

## 2016-08-04 MED ORDER — SODIUM CHLORIDE 0.9% FLUSH
10.0000 mL | INTRAVENOUS | Status: DC | PRN
  Administered 2016-08-04: 10 mL via INTRAVENOUS
  Filled 2016-08-04: qty 10

## 2016-08-04 MED ORDER — HEPARIN SOD (PORK) LOCK FLUSH 100 UNIT/ML IV SOLN
500.0000 [IU] | Freq: Once | INTRAVENOUS | Status: AC
  Administered 2016-08-04: 500 [IU] via INTRAVENOUS

## 2016-08-04 NOTE — Progress Notes (Signed)
Follow up with high risk pancreatic cancer pt  Contacted Pt by visiting at office visit   Wt Readings from Last 10 Encounters:  08/04/16 111 lb 3.2 oz (50.4 kg)  07/14/16 114 lb (51.7 kg)  07/07/16 114 lb (51.7 kg)  07/01/16 114 lb 12.8 oz (52.1 kg)  06/10/16 111 lb 9.6 oz (50.6 kg)  06/08/16 113 lb 8 oz (51.5 kg)  06/03/16 111 lb 6.4 oz (50.5 kg)  05/20/16 113 lb (51.3 kg)  05/14/16 111 lb 12.8 oz (50.7 kg)  04/29/16 115 lb 12.8 oz (52.5 kg)   Patient weight is overall stable, maybe slightly down.   Recently, Patient reports oral intake as negligible due to overwhelmingly pain that he relates to his gastritis-"I didn't eat for 3 days". He says his pain medications do not control the pain. Most of the time when he eats, he will immediately vomit. He cannot keep anything down. He cannot sleep and it has been a struggle to take his meds, including his creon.   Uncontrolled pain is by far and large his number one complaint. He says this is negatively affecting every other aspect of his life. NP to discuss with patient  Pt states he is [facility] to [facility] his oncology care.   He says that what he eats has no bearing on his pain. He experiences pain with all temperature, texture, consistency, acidity etc. RD did recommend he try a GI soft diet to lessen the amount of work placed on his GI tract.   He is provided with Ensure supplements today.   Left  samples, and handouts titled "Low fiber diet"  Burtis Junes RD, LDN, [facility] Pager: [phone] 08/04/2016 11:46 AM

## 2016-08-04 NOTE — Progress Notes (Signed)
Nicholas [name] tolerated port lab draw with flush well without complaints or incident. Labs reviewed with Fortunato Curling NP. Pt did not get chemo tx today, he would not be able to come in next week for his day 8 and he refused to receive IV fluids per NP recommendations. NA 124. Pt encouraged to increase sodium intake. Port accessed with 20 gauge needle with good blood return then flushed with 20 ml NS and 5 ml Heparin easily per protocol. VSS Pt discharged self ambulatory in satisfactory condition

## 2016-08-04 NOTE — Patient Instructions (Addendum)
[place] at [facility] Discharge Instructions  RECOMMENDATIONS MADE BY THE CONSULTANT AND ANY TEST RESULTS WILL BE SENT TO YOUR REFERRING PHYSICIAN.  Exam with Mike Craze, NP. You will return to [place] in 2 months to discuss your care at [facility] as well as follow up with our clinic.     Thank you for choosing [place] at [facility] to provide your oncology and hematology care.  To afford each patient quality time with our provider, please arrive at least 15 minutes before your scheduled appointment time.    If you have a lab appointment with the [facility] please come in thru the  Main Entrance and check in at the main information desk  You need to re-schedule your appointment should you arrive 10 or more minutes late.  We strive to give you quality time with our providers, and arriving late affects you and other patients whose appointments are after yours.  Also, if you no show three or more times for appointments you may be dismissed from the clinic at the providers discretion.     Again, thank you for choosing [facility].  Our hope is that these requests will decrease the amount of time that you wait before being seen by our physicians.       _____________________________________________________________  Should you have questions after your visit to [facility], please contact our office at [phone] between the hours of 8:30 a.m. and 4:30 p.m.  Voicemails left after 4:30 p.m. will not be returned until the following business day.  For prescription refill requests, have your pharmacy contact our office.       Resources For Cancer Patients and their Caregivers ? American Cancer Society: Can assist with transportation, wigs, general needs, runs Look Good Feel Better.        (270)209-9332 ? Cancer Care: Provides financial assistance, online support groups, medication/co-pay assistance.   1-800-813-HOPE (512) 770-0681) ? [place] Assists [place] cancer patients and their families through emotional , educational and financial support.  531-313-6961 ? [place] DSS Where to apply for food stamps, Medicaid and utility assistance. 601-612-5381 ? [facility]: Transportation to medical appointments. 336 155 4712 ? Social Security Administration: May apply for disability if have a Stage IV cancer. (601)813-3673 737 678 9660 ? [employer], Disability and Transit Services: Assists with nutrition, care and transit needs. [place] Support Programs: @10RELATIVEDAYS @ > Cancer Support Group  2nd Tuesday of the month 1pm-2pm, Journey Room  > Creative Journey  3rd Tuesday of the month 1130am-1pm, Journey Room  > Look Good Feel Better  1st Wednesday of the month 10am-12 noon, Journey Room (Call [place] to register [phone])

## 2016-08-04 NOTE — Progress Notes (Addendum)
[place]  Progress Note  Patient Care Team: Pcp Not In System as PCP - General  REASON FOR FOLLOW-UP:  Adenocarcinoma of body/tail of pancreas, T4N0Mx History of TB, treated by [facility] History of MAC Weight Loss History Hyponatremia Tobacco Abuse    Pancreatic cancer ([place])   11/11/2015 - 11/13/2015 Hospital Admission    abdominal pain, cramping, diarrhea, weight loss, hyponatremia CT pancreatic body mass      11/12/2015 Imaging    4.7 cm mass of pancreas to L of midline, involvement of several vascular structures also possibly the distal duodenum      11/21/2015 - 11/25/2015 Hospital Admission    EUS on 5/23, hyponatremia      11/22/2015 Imaging    Diffuse mesenteric edema, large hypo enhancing pancreatic mass, abutment of celiac axis, SMA, encasement of splenic artery, occludes splenic vein, compresses porta splenic confluence      11/25/2015 Procedure    EUS Dr. Ardis Hughs, irreg mass in pancreatic body/tail. 4.2 cm, involvement of major blood vessels not appreciated due to size of mass      11/25/2015 Pathology Results    malignant cells c/w adenocarcinoma      12/04/2015 Miscellaneous    Acid Fast Smear negative      12/04/2015 Imaging    No convincing evidence of pulm mets, improvement in LLL, RUL angular nodular thickening seen on comparison CT from 2013, residual cavitation remains at the L lung base, one nodule oblique fissue on R 12m new      12/08/2015 Procedure    Port a cath      12/09/2015 -  Chemotherapy    Gemzar/Abraxane days 1, 8, and 15 every 28 days.      12/12/2015 Imaging    MRI abd- Limited MRI, D/C prior to completion at patient request. Large infiltrative 8.1 x 4.7 x 5.8 cm pancreatic mass centered at the junction of the pancreatic body and tail, consistent with known primary pancreatic adenocarc      12/23/2015 Treatment Plan Change    Treatment deferred x 7 days due to thrombocytopenia      01/14/2016 Imaging    CT  abd/pelvis- Mass in the body/ tail of the pancreas consistent with known pancreatic carcinoma. Likely metastases to the liver demonstrating some progression since previous study.      02/26/2016 Treatment Plan Change    Day 15 deleted from treatment plan.  Chemotherapy is now days 1, 8 every 28 days.      05/06/2016 Imaging    CT abd/pelvis- 1. Mixed response to therapy. 2. Decrease in size of tumor within the tail of pancreas. 3. Several new liver metastases are identified. Previously noted liver metastasis have resolved or calcified.       HISTORY OF PRESENTING ILLNESS:  Nicholas HUCKINS66y.o. male is here for scheduled treatment for Stage IV pancreatic cancer. He has chronic pulmonary MAC; he is a smoker.   He is seen in an exam room, lying on the exam table clutching his abdomen.  He tells me his abdomen has been hurting; he has not been able to eat.  Takes OxyContin 15 mg Q12H and oxycodone 10 mg for breakthrough pain, which is helpful.  He states, "My gastritis pain is much worse than my cancer pain. If I could get this fixed, then I would be better."    He tells me, "Today will be my last treatment [facility]  He is planning to see them next Thursday, so he wants to have his Day 1 Abraxane/Gemzar treatment today, but does not want to return for Day 8.  He didn't complete Day 8 of cycle #9 either.  Today would be Day 1 of Cycle #10 of treatment.    He has occasional nausea. It is difficult for him to eat full meals.  He always tries to eat "a little something with my pills."  Burtis Junes, RD is also in room for some of today's visit for follow-up of his nutritional needs.  He is not taking his Creon with meals.       Weight today 111 lbs; weight down 3 lbs from 07/14/16 (64 lbs).     MEDICAL HISTORY:  Past Medical History:  Diagnosis Date  . Abdominal pain in male    "was told he has a Mass  near pancreas"  . Cancer (HCC)    Skin cancer-ear, skin cancer -leeg,cancer of leg muscle- left., basal cell   . Dyspnea    From previous TB  . Pancreatic cancer ([place]) 12/02/2015  . Pancreatic mass april 2017  . Pre-diabetes   . TB (pulmonary tuberculosis)    3 yrs ago-tx" positive TB test aslso" was told he is not contagious.treatment done for regulat TB.    SURGICAL HISTORY: Past Surgical History:  Procedure Laterality Date  . BIOPSY  07/09/2016   Procedure: BIOPSY;  Surgeon: Rogene Houston, MD;  [facility];  Service: Endoscopy;;  gastric biopsies of antrum,   . COLONOSCOPY W/ POLYPECTOMY    . ESOPHAGOGASTRODUODENOSCOPY (EGD) WITH PROPOFOL N/A 07/09/2016   Procedure: ESOPHAGOGASTRODUODENOSCOPY (EGD) WITH PROPOFOL;  Surgeon: Rogene Houston, MD;  Location[facility];  Service: Endoscopy;  Laterality: N/A;  7:30  . EUS N/A 11/25/2015   Procedure: ESOPHAGEAL ENDOSCOPIC ULTRASOUND (EUS) RADIAL;  Surgeon: Milus Banister, MD;  Location: [facility];  Service: Endoscopy;  Laterality: N/A;  . LEG SURGERY Left    " cancer removed from muscle"    SOCIAL HISTORY: Social History   Social History  . Marital status: Single    Spouse name: N/A  . Number of children: N/A  . Years of education: N/A   Occupational History  . Not on file.   Social History Main Topics  . Smoking status: Current Every Day Smoker    Packs/day: 0.50    Years: 30.00    Types: Cigarettes  . Smokeless tobacco: Never Used  . Alcohol use No     Comment: 1-2 beers at night - pt states he can't remember the last time he drank any alcohol  . Drug use: No  . Sexual activity: No   Other Topics Concern  . Not on file   Social History Narrative  . No narrative on file   Single. He has been engaged 4 times. No children He has cut his smoking back since he had tuberculosis. He has smoked more with recent pain. Sometimes 1 ppd, "it varies". Since last Thursday, he has had 2 beers. It does not  taste right. He has never had acid reflux before, but now he does. He was in the [employer]. Worked in Office manager, [place], and the post office.   FAMILY HISTORY: Family History  Problem Relation Age of Onset  . Stroke Mother   . Pneumonia Father    Mother living 38 years old at [facility].  Father deceased at 32 years old of pneumonia. He drank and drank a lot. [employer] English as a second language teacher. Had a massive heart attack at 89 yo 1 sister, lives in [place]:   No Known Allergies   MEDICATIONS:  Current Outpatient Prescriptions  Medication Sig Dispense Refill  . aspirin EC 81 MG tablet Take 81 mg by mouth daily.    Marland Kitchen DANDELION PO Take 1 capsule by mouth every morning.    Marland Kitchen doxylamine, Sleep, (UNISOM) 25 MG tablet Take 50 mg by mouth at bedtime as needed.    . fluconazole (DIFLUCAN) 100 MG tablet Take 1 tablet (100 mg total) by mouth daily. 14 tablet 0  . Gemcitabine HCl (GEMZAR IV) Inject into the vein. To begin June 6    . hydrocerin (EUCERIN) CREA Apply 1 application topically 2 (two) times daily as needed (For dry skin.).    Marland Kitchen lipase/protease/amylase (CREON) 36000 UNITS CPEP capsule Take 1 capsule (36,000 Units total) by mouth 3 (three) times daily before meals. 180 capsule 6  . metFORMIN (GLUCOPHAGE) 500 MG tablet Take 1 tablet (500 mg total) by mouth daily with breakfast. 30 tablet 1  . Multiple Vitamins-Minerals (MULTIVITAMINS THER. W/MINERALS) TABS tablet Take 1 tablet by mouth daily. Reported on 12/09/2015    . oxyCODONE (OXYCONTIN) 15 mg 12 hr tablet Take 1 tablet (15 mg total) by mouth every 8 (eight) hours. 90 tablet 0  . Oxycodone HCl 10 MG TABS Take 1 tablet (10 mg total) by mouth every 4 (four) hours as needed. Ok to fill on 05/21/2016, dose change 120 tablet 0  . PACLitaxel Protein-Bound Part (ABRAXANE IV) Inject into the vein. To begin June 6    . polyethylene glycol (MIRALAX / GLYCOLAX) packet Take 17 g by mouth daily as needed.    .  diphenhydrAMINE (SLEEP AID, DIPHENHYDRAMINE,) 25 MG tablet Take 25 mg by mouth at bedtime as needed and may repeat dose one time if needed for sleep.    Marland Kitchen lidocaine-prilocaine (EMLA) cream Apply a quarter size amount to port site 1 hour prior to chemo. Do not rub in. Cover with plastic wrap. (Patient not taking: Reported on 08/04/2016) 30 g 3  . pantoprazole (PROTONIX) 40 MG tablet Take 1 tablet (40 mg total) by mouth daily. (Patient not taking: Reported on 08/04/2016) 60 tablet 5  . senna-docusate (SENOKOT S) 8.6-50 MG tablet Take 2 tablets by mouth daily as needed for mild constipation.      No current facility-administered medications for this visit.    Facility-Administered Medications Ordered in Other Visits  Medication Dose Route Frequency Provider Last Rate Last Dose  . sodium chloride flush (NS) 0.9 % injection 10 mL  10 mL Intravenous PRN Baird Cancer, PA-C   10 mL at 08/04/16 1120  . sodium chloride flush (NS) 0.9 % injection 10 mL  10 mL Intravenous PRN Holley Bouche, NP   10 mL at 08/04/16 1215   REVIEW OF SYSTEMS:  Review of Systems  Constitutional: Positive for malaise/fatigue and weight loss.       Poor appetite  HENT: Negative.   Eyes: Negative.   Respiratory: Positive for shortness of breath.        Chronic SOB  Gastrointestinal: Positive for abdominal pain and nausea. Negative for blood in stool and melena.       Sharp pains to abdomen   Genitourinary: Negative.   Musculoskeletal: Negative.   Skin: Negative.   Neurological: Negative.   Endo/Heme/Allergies:  Negative.   Psychiatric/Behavioral: The patient has insomnia.   All other systems reviewed and are negative. 14 point ROS was done and is otherwise as detailed above or in HPI   PHYSICAL EXAMINATION: ECOG PERFORMANCE STATUS: 2 - Symptomatic, <50% confined to bed   Vitals:   08/04/16 1032  BP: 118/77  Pulse: (!) 117  Resp: 18  Temp: 97.8 F (36.6 C)   Filed Weights   08/04/16 1032  Weight: 111 lb  3.2 oz (50.4 kg)    Physical Exam  Constitutional: He is oriented to person, place, and time. No distress.  Thin/cachetic male   HENT:  Head: Normocephalic and atraumatic.  Mouth/Throat: Oropharyngeal exudate (consistent with oropharyngeal thrush (improving) ) and posterior oropharyngeal erythema present.    Hoarse voice   Eyes: Conjunctivae and EOM are normal. Pupils are equal, round, and reactive to light. Right eye exhibits no discharge. Left eye exhibits no discharge. No scleral icterus.  Neck: Normal range of motion. Neck supple. No JVD present. No tracheal deviation present. No thyromegaly present.  Cardiovascular: Regular rhythm and normal heart sounds.  Tachycardia present.  Exam reveals no gallop and no friction rub.   No murmur heard. Pulmonary/Chest: Effort normal. No stridor. He has no wheezes.  Diminished breath sounds to bilateral bases.   Abdominal: Soft. Bowel sounds are normal. He exhibits no distension and no mass. There is no tenderness. There is no rebound and no guarding.  No abdominal pain on palpation; no distension or rebound tenderness.  Musculoskeletal: Normal range of motion. He exhibits no edema.  Lymphadenopathy:    He has no cervical adenopathy.       Right: No supraclavicular adenopathy present.       Left: No supraclavicular adenopathy present.  Neurological: He is alert and oriented to person, place, and time. Gait normal.  Skin: Skin is warm and dry.  Psychiatric: Mood, memory, affect and judgment normal.  Nursing note and vitals reviewed.   LABORATORY DATA:  I have reviewed the data as listed Lab Results  Component Value Date   WBC 15.6 (H) 08/04/2016   HGB 13.0 08/04/2016   HCT 36.2 (L) 08/04/2016   MCV 98.1 08/04/2016   PLT 168 08/04/2016   CMP     Component Value Date/Time   NA 124 (L) 08/04/2016 1043   K 4.3 08/04/2016 1043   CL 90 (L) 08/04/2016 1043   CO2 26 08/04/2016 1043   GLUCOSE 168 (H) 08/04/2016 1043   BUN 8 08/04/2016  1043   CREATININE 0.60 (L) 08/04/2016 1043   CALCIUM 8.6 (L) 08/04/2016 1043   PROT 6.5 08/04/2016 1043   ALBUMIN 3.3 (L) 08/04/2016 1043   AST 44 (H) 08/04/2016 1043   ALT 34 08/04/2016 1043   ALKPHOS 320 (H) 08/04/2016 1043   BILITOT 0.5 08/04/2016 1043   GFRNONAA >60 08/04/2016 1043   GFRAA >60 08/04/2016 1043    Results for Nicholas [name], Nicholas [name] (MRN [id number]) as of 08/04/2016   Ref. Range 01/21/2016 08:57 02/12/2016 10:09 03/04/2016 09:37 03/25/2016 09:51 04/01/2016 10:58 04/22/2016 11:16 05/14/2016 10:41 06/03/2016 09:22 07/01/2016 09:53  CA 19-9 Latest Ref Range: 0 - 35 U/mL 1,367 (H) 961 (H) 548 (H) 300 (H) 228 (H) 149 (H) 141 (H) 126 (H) 180 (H)    ADDENDUM, 08/05/16: We will make patient aware of his continued increasing CA 19-9 tumor marker.-gwd   Results for Nicholas [name], Nicholas [name] (MRN [id number])   Ref. Range 05/14/2016 12:36  Folate Latest Ref Range: >5.9 ng/mL 53.1  Vitamin B12 Latest Ref Range: 180 - 914 pg/mL 1,086 (H)     PATHOLOGY:     RADIOGRAPHIC STUDIES: I have personally reviewed the radiological images as listed and agreed with the findings in the report.  Study Result   CLINICAL DATA:  Restaging pancreas cancer  EXAM: CT ABDOMEN AND PELVIS WITH CONTRAST  TECHNIQUE: Multidetector CT imaging of the abdomen and pelvis was performed using the standard protocol following bolus administration of intravenous contrast.  CONTRAST:  44m ISOVUE-300 IOPAMIDOL (ISOVUE-300) INJECTION 61%, 1058mISOVUE-300 IOPAMIDOL (ISOVUE-300) INJECTION 61%  COMPARISON:  01/14/2016  FINDINGS: Lower chest: No acute abnormality.  Hepatobiliary: Within the posterior right lobe of liver there is a 11 mm low-attenuation lesion, 28 of series 2. New from previous exam. Treated lesion within segment 5 of the liver measures 3 mm in appears calcified. Previously 10 mm and noncalcified. New lesion within the lateral right lobe of liver measures 1.5 cm, image 16 of series 2. Also  new from previous exam is a 8 mm low-attenuation structure, image 14 of series 2. 5 mm lesion within the medial segment of left lobe of liver has resolved in the interval. The gallbladder is normal. No biliary dilatation.  Pancreas: Tumor involving the tail of pancreas measures 4.7 x 1.9 cm, image 20 of series 2. Previously 6.7 x 3.0 cm.  Spleen: Normal in size without focal abnormality.  Adrenals/Urinary Tract: Normal appearance of the right adrenal gland. Enlargement of the left adrenal gland appears similar to the previous exam, image 15 of series 2. The kidneys are unremarkable. There is mild ventral bladder wall thickening.  Stomach/Bowel: Stomach is within normal limits. Appendix appears normal. No evidence of bowel wall thickening, distention, or inflammatory changes.  Vascular/Lymphatic: Aortic atherosclerosis. No aneurysm. No upper abdominal adenopathy. There is no pelvic or inguinal adenopathy.  Reproductive: Prostate is unremarkable.  Other: A small amount of ascites is identified within the dependent portion of the pelvis. Decreased perihepatic ascites.  Musculoskeletal: Degenerative disc disease noted within the lumbar spine. No aggressive lytic or sclerotic bone lesions.  IMPRESSION: 1. Mixed response to therapy. 2. Decrease in size of tumor within the tail of pancreas. 3. Several new liver metastases are identified. Previously noted liver metastasis have resolved or calcified.   Electronically Signed   By: TaKerby Moors.D.   On: 05/05/2016 15:54        EUS:        ASSESSMENT & PLAN:  Adenocarcinoma of body/tail of pancreas, T4N0M1, Stage IV disease [facility] Hyponatremia Tobacco Abuse Cancer related pain Hypokalemia Hyperglycemia Macrocytosis End of life discussion/goals of care    Stage IV pancreatic cancer:  -He presents for ongoing Abraxane/Gemzar,  after treatment was rescheduled from last week because he declined to stay d/t abdominal pain and nausea.  Today, he presents for his treatment as scheduled.  He tells me [facility]  [facility]; he is unable to tell me which physician he will be meeting for consultation. He tells me that he only wants to receive day 1 of his chemotherapy regimen which would include Gemzar and Abraxane today; he does not want to come back for day 8 of this cycle of treatment next week. His reasoning for this is that he "  doesn't want to feel bad when he goes to [facility] for his consultation." I explained to him that it is unsafe for [place] to split his current cycle of chemotherapy, as we would not be able to follow-up on his tolerance and labs, and if he is not willing to come back next week for continued follow-up then we would need to forego today's treatment. He decided to leave without treatment today. We will plan on seeing him back in 3 weeks for continued follow-up and questionable treatment depending on his visit at [facility] and the patient's wishes.   Leukocytosis -Likely secondary to MAC; will continue to monitor.   Hyponatremia:  -Serum sodium noted to be 124 today. I recommended that we give Mr. Sandoval; IV fluids to help address his hyponatremia. I explained the severity and potential high risk of untreated hyponatremia and seizures. He declined to stay for her IV fluids today. He states he will go home and drink some Gatorade and eat some salty foods. Again I reiterated my recommendation to receive IV fluids. He could be monitored more closely by our nursing staff, but again he declined. His port was accessed by the nurse and he left the clinic today ambulatory and in stable condition. He states "If I get to feeling too bad at home, then I'll  call 911."  Abdominal pain, Nausea, Weight loss:  -He remains as of abdominal pain today during her visit. He was seen by Burtis Junes, RD for continued management of his abdominal pain, nausea, and weight loss. He is not taking his pancreatic enzymes. I stress the importance of him taking Creon before his meals. He tells me he does need refills for the Creon; absent electronic prescription to [place] for his Creon to be taken 3 times a day with food. His bowels are reportedly moving well. He is taking OxyContin 15 mg every 12 hours, and oxycodone 10 mg for breakthrough pain. He tells me his abdominal pain is better at home, "I don't know why it so that when I come here." He attributes the largest majority of his abdominal pain to his gastritis. He reports that he is not taking his Protonix. Encouraged continued follow-up with GI regarding his gastritis. We will maintain his current pain medication regimen. He tells me he does not need refills at this time.  Oral candidiasis:  -Oropharyngeal thrush is improving.  He has 1 more week of therapy with Diflucan 100 mg daily (total of 2 weeks of therapy).  Encouraged him to complete the full course of Diflucan and to report any recurring evidence of thrush. He agreed with this plan.  Goals of care discussion:  -Was spent quite a bit of time today discussing his current situation and his future goals for treatment for his pancreatic cancer. His CA 19-9 continues to rise, which is concerning; CA 19-9 pending for today.  He tells me "well my hope is to be cured, and God is the ultimately healer." When asked what he hopes to achieve by his consultation at [facility], he tells me he is looking for "the cure." We spent some time today exploring the possibility that further treatment options may not be available for him. There may be a time when he will no longer be a candidate for treatment given his advanced disease or functional status. He acknowledges that the  treatments "have been hard on me."  He also shared with me stories of some of his friends "who were cured of their cancer,  then it came back and they died quick after that."  He states, "if [facility] and you all tell me there are no other treatment options at some point in the future, then I will go home and get ready to meet my Reita Cliche."  I acknowledged that his faith clearly brings him comfort and plays a large role in his decision-making for his treatments. We discussed the possibility of initiating more comfort care and symptom management, rather than further cancer treatments. He is not interested in this option at this time. We also broached the topic of code status; I explained to him that if his heart was to stop bleeding or he was to stop breathing while he is here at cancer center, that CPR and intubation would follow. I stressed my concerns to him that given his weakened state that likely it would be very difficult for him to survive CPR and intubation. He tells me he does not want to be "on life support." I explained to him that intubation is a form of life support, as his body would likely be too weak to ever be extubated. He tells me he'll continue to think about this, but at this time he wants [place] to "do everything we can." Remains full code at this time.      Dispo: -Return to cancer center in 3 weeks for follow-up and ?chemotherapy, depending on plan at [facility] per patient.   No orders of the defined types were placed in this encounter.    All questions were answered. The patient knows to call the clinic with any problems, questions or concerns.    Mike Craze, NP [place] [phone]

## 2016-08-04 NOTE — Patient Instructions (Signed)
[place] at [facility] Discharge Instructions  RECOMMENDATIONS MADE BY THE CONSULTANT AND ANY TEST RESULTS WILL BE SENT TO YOUR REFERRING PHYSICIAN.  Port lab draw with flush per protocol today. Follow-up as scheduled. Call clinic for any questions or concerns  Thank you for choosing [place] at [facility] to provide your oncology and hematology care.  To afford each patient quality time with our provider, please arrive at least 15 minutes before your scheduled appointment time.    If you have a lab appointment with the [facility] please come in thru the  Main Entrance and check in at the main information desk  You need to re-schedule your appointment should you arrive 10 or more minutes late.  We strive to give you quality time with our providers, and arriving late affects you and other patients whose appointments are after yours.  Also, if you no show three or more times for appointments you may be dismissed from the clinic at the providers discretion.     Again, thank you for choosing [facility].  Our hope is that these requests will decrease the amount of time that you wait before being seen by our physicians.       _____________________________________________________________  Should you have questions after your visit to [facility], please contact our office at [phone] between the hours of 8:30 a.m. and 4:30 p.m.  Voicemails left after 4:30 p.m. will not be returned until the following business day.  For prescription refill requests, have your pharmacy contact our office.       Resources For Cancer Patients and their Caregivers ? American Cancer Society: Can assist with transportation, wigs, general needs, runs Look Good Feel Better.        423-767-5820 ? Cancer Care: Provides financial assistance, online support groups, medication/co-pay assistance.  1-800-813-HOPE (365) 631-9636) ? [place] Assists [place] cancer patients and their families through emotional , educational and financial support.  715-551-5001 ? [place] DSS Where to apply for food stamps, Medicaid and utility assistance. 424-173-7642 ? [facility]: Transportation to medical appointments. 662-601-4583 ? Social Security Administration: May apply for disability if have a Stage IV cancer. (478)682-1321 250-862-6959 ? [employer], Disability and Transit Services: Assists with nutrition, care and transit needs. [place] Support Programs: @10RELATIVEDAYS @ > Cancer Support Group  2nd Tuesday of the month 1pm-2pm, Journey Room  > Creative Journey  3rd Tuesday of the month 1130am-1pm, Journey Room  > Look Good Feel Better  1st Wednesday of the month 10am-12 noon, Journey Room (Call [place] to register [phone])

## 2016-08-05 ENCOUNTER — Ambulatory Visit (HOSPITAL_COMMUNITY): Payer: Medicaid Other

## 2016-08-05 LAB — CANCER ANTIGEN 19-9: CA 19 9: 614 U/mL — AB (ref 0–35)

## 2016-08-10 ENCOUNTER — Telehealth (HOSPITAL_COMMUNITY): Payer: Self-pay | Admitting: *Deleted

## 2016-08-11 ENCOUNTER — Other Ambulatory Visit (HOSPITAL_COMMUNITY): Payer: Self-pay | Admitting: Oncology

## 2016-08-11 DIAGNOSIS — G893 Neoplasm related pain (acute) (chronic): Secondary | ICD-10-CM

## 2016-08-11 DIAGNOSIS — C251 Malignant neoplasm of body of pancreas: Secondary | ICD-10-CM

## 2016-08-11 MED ORDER — OXYCODONE HCL ER 15 MG PO T12A: 15.0000 mg | EXTENDED_RELEASE_TABLET | Freq: Three times a day (TID) | ORAL | 0 refills | Status: AC

## 2016-08-11 MED ORDER — OXYCODONE HCL 10 MG PO TABS: 10.0000 mg | ORAL_TABLET | ORAL | 0 refills | Status: AC | PRN

## 2016-08-11 NOTE — Telephone Encounter (Signed)
Notified patient that script was ready for pick-up at the front desk and if he didn't keep his follow up appt then he could not get any more pain medication from [place]. He verbalized understanding and said he will be able to get his pain medication at [facility].

## 2016-08-11 NOTE — Telephone Encounter (Signed)
Rx is printed.  If he does not show for his follow-up appointment, this will be the last Rx written and he will be discharged from the clinic.  TK

## 2016-08-15 ENCOUNTER — Observation Stay (HOSPITAL_COMMUNITY)
Admission: EM | Admit: 2016-08-15 | Discharge: 2016-08-16 | Disposition: A | Discharge status: Home / Self Care | Payer: Medicaid Other | Attending: Internal Medicine | Admitting: Internal Medicine

## 2016-08-15 ENCOUNTER — Encounter (HOSPITAL_COMMUNITY): Payer: Self-pay | Admitting: *Deleted

## 2016-08-15 DIAGNOSIS — Z7984 Long term (current) use of oral hypoglycemic drugs: Secondary | ICD-10-CM | POA: Insufficient documentation

## 2016-08-15 DIAGNOSIS — R410 Disorientation, unspecified: Secondary | ICD-10-CM | POA: Diagnosis not present

## 2016-08-15 DIAGNOSIS — Z85828 Personal history of other malignant neoplasm of skin: Secondary | ICD-10-CM | POA: Diagnosis not present

## 2016-08-15 DIAGNOSIS — C259 Malignant neoplasm of pancreas, unspecified: Principal | ICD-10-CM | POA: Insufficient documentation

## 2016-08-15 DIAGNOSIS — R112 Nausea with vomiting, unspecified: Secondary | ICD-10-CM | POA: Diagnosis present

## 2016-08-15 DIAGNOSIS — R1084 Generalized abdominal pain: Secondary | ICD-10-CM | POA: Diagnosis present

## 2016-08-15 DIAGNOSIS — Z79899 Other long term (current) drug therapy: Secondary | ICD-10-CM | POA: Diagnosis not present

## 2016-08-15 DIAGNOSIS — Z7982 Long term (current) use of aspirin: Secondary | ICD-10-CM | POA: Diagnosis not present

## 2016-08-15 DIAGNOSIS — C251 Malignant neoplasm of body of pancreas: Secondary | ICD-10-CM | POA: Diagnosis present

## 2016-08-15 DIAGNOSIS — E871 Hypo-osmolality and hyponatremia: Secondary | ICD-10-CM | POA: Diagnosis not present

## 2016-08-15 DIAGNOSIS — F17210 Nicotine dependence, cigarettes, uncomplicated: Secondary | ICD-10-CM | POA: Insufficient documentation

## 2016-08-15 DIAGNOSIS — F502 Bulimia nervosa: Secondary | ICD-10-CM

## 2016-08-15 LAB — COMPREHENSIVE METABOLIC PANEL
ALBUMIN: 3.3 g/dL — AB (ref 3.5–5.0)
ALT: 82 U/L — ABNORMAL HIGH (ref 17–63)
ANION GAP: 15 (ref 5–15)
AST: 94 U/L — AB (ref 15–41)
Alkaline Phosphatase: 641 U/L — ABNORMAL HIGH (ref 38–126)
BUN: 21 mg/dL — AB (ref 6–20)
CHLORIDE: 84 mmol/L — AB (ref 101–111)
CO2: 25 mmol/L (ref 22–32)
Calcium: 9.4 mg/dL (ref 8.9–10.3)
Creatinine, Ser: 0.64 mg/dL (ref 0.61–1.24)
GFR calc Af Amer: >60 mL/min (ref >60)
GLUCOSE: 113 mg/dL — AB (ref 65–99)
Potassium: 5.2 mmol/L — ABNORMAL HIGH (ref 3.5–5.1)
Sodium: 124 mmol/L — ABNORMAL LOW (ref 135–145)
Total Bilirubin: 1 mg/dL (ref 0.3–1.2)
Total Protein: 7.3 g/dL (ref 6.5–8.1)

## 2016-08-15 LAB — LIPASE, BLOOD: LIPASE: 21 U/L (ref 11–51)

## 2016-08-15 LAB — CBC
HEMATOCRIT: 45.5 % (ref 39.0–52.0)
HEMOGLOBIN: 16.6 g/dL (ref 13.0–17.0)
MCH: 34.6 pg — ABNORMAL HIGH (ref 26.0–34.0)
MCHC: 36.5 g/dL — ABNORMAL HIGH (ref 30.0–36.0)
MCV: 94.8 fL (ref 78.0–100.0)
Platelets: 308 10*3/uL (ref 150–400)
RBC: 4.8 MIL/uL (ref 4.22–5.81)
RDW: 14.4 % (ref 11.5–15.5)
WBC: 17.9 10*3/uL — AB (ref 4.0–10.5)

## 2016-08-15 MED ORDER — OXYCODONE HCL ER 15 MG PO T12A
15.0000 mg | EXTENDED_RELEASE_TABLET | Freq: Three times a day (TID) | ORAL | Status: DC
  Administered 2016-08-16 (×2): 15 mg via ORAL
  Filled 2016-08-15 (×2): qty 1

## 2016-08-15 MED ORDER — HYDROMORPHONE HCL 2 MG/ML IJ SOLN
2.0000 mg | Freq: Once | INTRAMUSCULAR | Status: AC
  Administered 2016-08-15: 2 mg via INTRAVENOUS
  Filled 2016-08-15: qty 1

## 2016-08-15 MED ORDER — ONDANSETRON HCL 4 MG/2ML IJ SOLN: 4.0000 mg | Freq: Four times a day (QID) | INTRAMUSCULAR | Status: DC | PRN

## 2016-08-15 MED ORDER — ONDANSETRON HCL 4 MG/2ML IJ SOLN
4.0000 mg | Freq: Once | INTRAMUSCULAR | Status: AC
  Administered 2016-08-15: 4 mg via INTRAVENOUS
  Filled 2016-08-15: qty 2

## 2016-08-15 MED ORDER — INSULIN ASPART 100 UNIT/ML ~~LOC~~ SOLN: 0.0000 [IU] | Freq: Three times a day (TID) | SUBCUTANEOUS | Status: DC

## 2016-08-15 MED ORDER — ONDANSETRON HCL 4 MG/2ML IJ SOLN: 4.0000 mg | Freq: Once | INTRAMUSCULAR | Status: DC | PRN

## 2016-08-15 MED ORDER — DOXYLAMINE SUCCINATE (SLEEP) 25 MG PO TABS
50.0000 mg | ORAL_TABLET | Freq: Every evening | ORAL | Status: DC | PRN
  Filled 2016-08-15: qty 2

## 2016-08-15 MED ORDER — ENOXAPARIN SODIUM 40 MG/0.4ML ~~LOC~~ SOLN
40.0000 mg | SUBCUTANEOUS | Status: DC
  Administered 2016-08-16: 40 mg via SUBCUTANEOUS
  Filled 2016-08-15: qty 0.4

## 2016-08-15 MED ORDER — SODIUM CHLORIDE 0.9 % IV SOLN
INTRAVENOUS | Status: DC
  Administered 2016-08-15 – 2016-08-16 (×2): via INTRAVENOUS

## 2016-08-15 MED ORDER — HYDROMORPHONE HCL 1 MG/ML IJ SOLN: 1.0000 mg | INTRAMUSCULAR | Status: DC | PRN

## 2016-08-15 MED ORDER — SODIUM CHLORIDE 0.9 % IV BOLUS (SEPSIS)
500.0000 mL | Freq: Once | INTRAVENOUS | Status: AC
  Administered 2016-08-15: 1000 mL via INTRAVENOUS

## 2016-08-15 MED ORDER — ONDANSETRON HCL 4 MG PO TABS: 4.0000 mg | ORAL_TABLET | Freq: Four times a day (QID) | ORAL | Status: DC | PRN

## 2016-08-15 NOTE — ED Notes (Signed)
Reminded pt of need for urine sample. Pt states "I just don't feel like I can even come close to needing to pee."

## 2016-08-15 NOTE — ED Triage Notes (Signed)
Pt reports abdominal pain that radiates throughout all quadrants (pain 10/10).  Pt has pancreatic cancer.  Pt seen in this ED 1 week ago. Pt also dx with gastroenteritis per pt.

## 2016-08-15 NOTE — ED Notes (Signed)
Pt Dr Darrick Meigs pt does not need precautions for TB,

## 2016-08-15 NOTE — ED Notes (Signed)
Pt unable to provide urine sample at this time 

## 2016-08-15 NOTE — ED Notes (Signed)
Pt still unable to give urine sample,

## 2016-08-15 NOTE — ED Notes (Signed)
Pt updated on plan of care,  

## 2016-08-15 NOTE — ED Notes (Signed)
ED Provider at bedside. 

## 2016-08-15 NOTE — ED Provider Notes (Signed)
[place] DEPT Provider Note   CSN: [id number] Arrival date & time: 08/15/16  1757     History   Chief Complaint Chief Complaint  Patient presents with  . Abdominal Pain    Hx of Pancreatic Cancer    HPI Nicholas [name] is a 66 y.o. male.  Patient followed by the hematology oncology. Has stage IV pancreatic cancer. Recently referred to [facility] for evaluation for additional care it appears that they're thinking that he is probably ready for hospice care. The patient feels he is not ready yet. Patient was just evaluated at [facility] on Thursday. Normally followed by hematology oncology clinic here at [facility]. Since being seen on Thursday patient said increased abdominal pain difficulty controlling his pain. Infrequent episodes of nausea and vomiting. Patient very uncomfortable.      Past Medical History:  Diagnosis Date  . Abdominal pain in male    "was told he has a Mass near pancreas"  . Cancer (HCC)    Skin cancer-ear, skin cancer -leeg,cancer of leg muscle- left., basal cell   . Dyspnea    From previous TB  . Pancreatic cancer ([place]) 12/02/2015  . Pancreatic mass april 2017  . Pre-diabetes   . TB (pulmonary tuberculosis)    3 yrs ago-tx" positive TB test aslso" was told he is not contagious.treatment done for regulat TB.    Patient Active Problem List   Diagnosis Date Noted  . Malignant neoplasm of body of pancreas ([place]) 06/08/2016  . Abdominal pain, epigastric 06/08/2016  . 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 SBO (small bowel obstruction) 01/14/2016  . Nausea & vomiting 01/14/2016  . Black tarry stools 12/23/2015  . Pancreatic cancer ([place]) 12/02/2015  . Abdominal pain, acute, generalized 11/22/2015  . Abdominal pain 11/22/2015  . Hyponatremia 11/12/2015  . Tobacco use disorder 11/12/2015    Past Surgical History:  Procedure Laterality Date  . BIOPSY  07/09/2016   Procedure: BIOPSY;  Surgeon: Rogene Houston, MD;  [facility];  Service: Endoscopy;;  gastric biopsies of antrum,   . COLONOSCOPY W/ POLYPECTOMY    . ESOPHAGOGASTRODUODENOSCOPY (EGD) WITH PROPOFOL N/A 07/09/2016   Procedure: ESOPHAGOGASTRODUODENOSCOPY (EGD) WITH PROPOFOL;  Surgeon: Rogene Houston, MD;  [facility];  Service: Endoscopy;  Laterality: N/A;  7:30  . EUS N/A 11/25/2015   Procedure: ESOPHAGEAL ENDOSCOPIC ULTRASOUND (EUS) RADIAL;  Surgeon: Milus Banister, MD;  Location: [facility] END[facility];  Service: Endoscopy;  Laterality: N/A;  . LEG SURGERY Left    " cancer removed from muscle"       Home Medications    Prior to Admission medications   Medication Sig Start Date End Date Taking? Authorizing Provider  aspirin EC 81 MG tablet Take 81 mg by mouth daily.   Yes Historical Provider, MD  DANDELION PO Take 1 capsule by mouth every morning.   Yes Historical Provider, MD  diphenhydrAMINE (SLEEP AID, DIPHENHYDRAMINE,) 25 MG tablet Take 25 mg by mouth at bedtime as needed and may repeat dose one time if needed for sleep.   Yes Historical Provider, MD  doxylamine, Sleep, (UNISOM) 25 MG tablet Take 50 mg by mouth at bedtime as needed.   Yes Historical Provider, MD  Gemcitabine HCl (GEMZAR IV) Inject into the vein. To begin June 6   Yes Historical Provider, MD  hydrocerin (EUCERIN) CREA Apply 1 application topically 2 (two) times daily as needed (For dry skin.).   Yes Historical Provider, MD  lidocaine-prilocaine (EMLA)  cream Apply a quarter size amount to port site 1 hour prior to chemo. Do not rub in. Cover with plastic wrap. 12/02/15  Yes Patrici Ranks, MD  lipase/protease/amylase (CREON) 36000 UNITS CPEP capsule Take 1 capsule (36,000 Units total) by mouth 3 (three) times daily before meals. 08/04/16  Yes Holley Bouche, NP  metFORMIN (GLUCOPHAGE) 500 MG tablet Take 1 tablet (500 mg total) by mouth daily with breakfast. 06/10/16  Yes Patrici Ranks, MD  Multiple Vitamins-Minerals (MULTIVITAMINS THER. W/MINERALS) TABS tablet Take 1  tablet by mouth daily. Reported on 12/09/2015   Yes Historical Provider, MD  oxyCODONE (OXYCONTIN) 15 mg 12 hr tablet Take 1 tablet (15 mg total) by mouth every 8 (eight) hours. 08/11/16  Yes Baird Cancer, PA-C  Oxycodone HCl 10 MG TABS Take 1 tablet (10 mg total) by mouth every 4 (four) hours as needed. 08/11/16  Yes Baird Cancer, PA-C  PACLitaxel Protein-Bound Part (ABRAXANE IV) Inject into the vein. To begin June 6   Yes Historical Provider, MD  pantoprazole (PROTONIX) 40 MG tablet Take 1 tablet (40 mg total) by mouth daily. 07/09/16  Yes Rogene Houston, MD  polyethylene glycol (MIRALAX / GLYCOLAX) packet Take 17 g by mouth daily as needed.   Yes Historical Provider, MD  senna-docusate (SENOKOT S) 8.6-50 MG tablet Take 2 tablets by mouth daily as needed for mild constipation.    Yes Historical Provider, MD  fluconazole (DIFLUCAN) 100 MG tablet Take 1 tablet (100 mg total) by mouth daily. Patient not taking: Reported on 08/15/2016 07/28/16   Holley Bouche, NP    Family History Family History  Problem Relation Age of Onset  . Stroke Mother   . Pneumonia Father     Social History Social History  Substance Use Topics  . Smoking status: Current Every Day Smoker    Packs/day: 0.50    Years: 30.00    Types: Cigarettes  . Smokeless tobacco: Never Used  . Alcohol use No     Comment: 1-2 beers at night - pt states he can't remember the last time he drank any alcohol     Allergies   Patient has no known allergies.   Review of Systems Review of Systems  Constitutional: Negative for fever.  HENT: Negative for congestion.   Eyes: Negative for visual disturbance.  Respiratory: Negative for shortness of breath.   Cardiovascular: Negative for chest pain.  Gastrointestinal: Positive for abdominal pain and nausea.  Genitourinary: Negative for dysuria.  Musculoskeletal: Positive for back pain.  Skin: Negative for rash.  Allergic/Immunologic: Positive for immunocompromised state.    Hematological: Does not bruise/bleed easily.  Psychiatric/Behavioral: Positive for confusion.     Physical Exam Updated Vital Signs BP 155/79   Pulse 108   Temp 97.7 F (36.5 C) (Oral)   Resp 21   Ht _0  (1.803 m)   Wt 51.3 kg   SpO2 94%   BMI 15.76 kg/m   Physical Exam  Constitutional: He is oriented to person, place, and time.  Cachectic.  HENT:  Head: Normocephalic and atraumatic.  Mucous membranes dry.  Eyes: Conjunctivae and EOM are normal. Pupils are equal, round, and reactive to light.  Neck: Neck supple.  Cardiovascular: Normal rate and regular rhythm.   Pulmonary/Chest: Effort normal and breath sounds normal. No respiratory distress.  Port-A-Cath right upper chest area.  Abdominal: Soft. Bowel sounds are normal. He exhibits distension.  Musculoskeletal: Normal range of motion.  Neurological: He is alert and  oriented to person, place, and time. No cranial nerve deficit or sensory deficit. He exhibits normal muscle tone. Coordination normal.  Skin: Skin is warm. No rash noted.  Nursing note and vitals reviewed.    ED Treatments / Results  Labs (all labs ordered are listed, but only abnormal results are displayed) Labs Reviewed  COMPREHENSIVE METABOLIC PANEL - Abnormal; Notable for the following:       Result Value   Sodium 124 (*)    Potassium 5.2 (*)    Chloride 84 (*)    Glucose, Bld 113 (*)    BUN 21 (*)    Albumin 3.3 (*)    AST 94 (*)    ALT 82 (*)    Alkaline Phosphatase 641 (*)    All other components within normal limits  CBC - Abnormal; Notable for the following:    WBC 17.9 (*)    MCH 34.6 (*)    MCHC 36.5 (*)    All other components within normal limits  LIPASE, BLOOD  URINALYSIS, ROUTINE W REFLEX MICROSCOPIC   Results for orders placed or performed during the hospital encounter of 08/15/16  Lipase, blood  Result Value Ref Range   Lipase 21 11 - 51 U/L  Comprehensive metabolic panel  Result Value Ref Range   Sodium 124 (L) 135  - 145 mmol/L   Potassium 5.2 (H) 3.5 - 5.1 mmol/L   Chloride 84 (L) 101 - 111 mmol/L   CO2 25 22 - 32 mmol/L   Glucose, Bld 113 (H) 65 - 99 mg/dL   BUN 21 (H) 6 - 20 mg/dL   Creatinine, Ser 0.64 0.61 - 1.24 mg/dL   Calcium 9.4 8.9 - 10.3 mg/dL   Total Protein 7.3 6.5 - 8.1 g/dL   Albumin 3.3 (L) 3.5 - 5.0 g/dL   AST 94 (H) 15 - 41 U/L   ALT 82 (H) 17 - 63 U/L   Alkaline Phosphatase 641 (H) 38 - 126 U/L   Total Bilirubin 1.0 0.3 - 1.2 mg/dL   GFR calc non Af Amer >60 >60 mL/min   GFR calc Af Amer >60 >60 mL/min   Anion gap 15 5 - 15  CBC  Result Value Ref Range   WBC 17.9 (H) 4.0 - 10.5 K/uL   RBC 4.80 4.22 - 5.81 MIL/uL   Hemoglobin 16.6 13.0 - 17.0 g/dL   HCT 45.5 39.0 - 52.0 %   MCV 94.8 78.0 - 100.0 fL   MCH 34.6 (H) 26.0 - 34.0 pg   MCHC 36.5 (H) 30.0 - 36.0 g/dL   RDW 14.4 11.5 - 15.5 %   Platelets 308 150 - 400 K/uL     EKG  EKG Interpretation None       Radiology No results found.  Procedures Procedures (including critical care time)  Medications Ordered in ED Medications  ondansetron (ZOFRAN) injection 4 mg (not administered)  0.9 %  sodium chloride infusion ( Intravenous New Bag/Given 08/15/16 1926)  sodium chloride 0.9 % bolus 500 mL (0 mLs Intravenous Stopped 08/15/16 2040)  ondansetron (ZOFRAN) injection 4 mg (4 mg Intravenous Given 08/15/16 1926)  HYDROmorphone (DILAUDID) injection 2 mg (2 mg Intravenous Given 08/15/16 1926)     Initial Impression / Assessment and Plan / ED Course  I have reviewed the triage vital signs and the nursing notes.  Pertinent labs & imaging results that were available during my care of the patient were reviewed by me and considered in my medical decision making (see  chart for details).    Patient with end-stage pancreatic cancer. Stage IV.  Patient with a lot of increased pain and vomiting the past 2-3 days. Labs here showed significant hyponatremia. Patient would benefit from IV hydration. To give good pain control  here. Hospitalist has agreed to admit for IV hydration. Also patient was some confusion about whether he is a candidate for hospice care or not they can have the hematology oncology sort that out tomorrow.   Final Clinical Impressions(s) / ED Diagnoses   Final diagnoses:  Hyponatremia  Malignant neoplasm of pancreas, unspecified location of malignancy [facility])    New Prescriptions New Prescriptions   No medications on file     Fredia Sorrow, MD 08/16/16 0020

## 2016-08-15 NOTE — ED Notes (Signed)
Dr Lama at bedside,  

## 2016-08-15 NOTE — ED Notes (Signed)
Pt denies any pain or nausea at present, update given,

## 2016-08-15 NOTE — H&P (Addendum)
TRH H&P    Patient Demographics:    Nicholas [name], is a 66 y.o. male  MRN: [id number]  DOB - 04/08/1951  Admit Date - 08/15/2016  Referring MD/NP/PA: Dr. Bobby Rumpf  Outpatient Primary MD for the patient is Pcp Not In System  Patient coming from: Home  Chief Complaint  Patient presents with  . Abdominal Pain    Hx of Pancreatic Cancer      HPI:    Nicholas [name]  is a 66 y.o. male, with stage IV adenocarcinoma of pancreas, currently not on chemotherapy. Patient was seen at [facility] and was told that he should be off for comfort measures only as there is no treatment for advanced stage of cancer. Today came to the ED for abdominal pain poor by mouth intake with intermittent nausea and vomiting. He denies diarrhea. No fever or chills. No chest pain. Complains of shortness of breath on exertion. Patient is on OxyContin 50 mg by mouth every 8 hours and oxycodone 10 mg every 4 hours when necessary for pain. In the ED lab work showed sodium 124, potassium 5.2.     Review of systems:    In addition to the HPI above,  No Fever-chills, +Headache, +Dizziness No Chest pain, Cough or Shortness of Breath, No Abdominal pain, + Nausea  No Blood in stool or Urine, No dysuria, No new skin rashes or bruises, No new joints pains-aches,  No new weakness, tingling, numbness in any extremity, + Weight loss, previous 113 pounds No polyuria, polydypsia or polyphagia, No significant Mental Stressors.  A full 10 point Review of Systems was done, except as stated above, all other Review of Systems were negative.   With Past History of the following :    Past Medical History:  Diagnosis Date  . Abdominal pain in male    "was told he has a Mass near pancreas"  . Cancer (HCC)    Skin cancer-ear, skin cancer -leeg,cancer of leg muscle- left., basal cell   . Dyspnea    From previous TB  . Pancreatic cancer  ([place]) 12/02/2015  . Pancreatic mass april 2017  . Pre-diabetes   . TB (pulmonary tuberculosis)    3 yrs ago-tx" positive TB test aslso" was told he is not contagious.treatment done for regulat TB.      Past Surgical History:  Procedure Laterality Date  . BIOPSY  07/09/2016   Procedure: BIOPSY;  Surgeon: Rogene Houston, MD;  [facility];  Service: Endoscopy;;  gastric biopsies of antrum,   . COLONOSCOPY W/ POLYPECTOMY    . ESOPHAGOGASTRODUODENOSCOPY (EGD) WITH PROPOFOL N/A 07/09/2016   Procedure: ESOPHAGOGASTRODUODENOSCOPY (EGD) WITH PROPOFOL;  Surgeon: Rogene Houston, MD;  [facility];  Service: Endoscopy;  Laterality: N/A;  7:30  . EUS N/A 11/25/2015   Procedure: ESOPHAGEAL ENDOSCOPIC ULTRASOUND (EUS) RADIAL;  Surgeon: Milus Banister, MD;  [facility];  Service: Endoscopy;  Laterality: N/A;  . LEG SURGERY Left    " cancer removed from muscle"      Social  History:      Social History  Substance Use Topics  . Smoking status: Current Every Day Smoker    Packs/day: 0.50    Years: 30.00    Types: Cigarettes  . Smokeless tobacco: Never Used  . Alcohol use No     Comment: 1-2 beers at night - pt states he can't remember the last time he drank any alcohol       Family History :     Family History  Problem Relation Age of Onset  . Stroke Mother   . Pneumonia Father       Home Medications:   Prior to Admission medications   Medication Sig Start Date End Date Taking? Authorizing Provider  aspirin EC 81 MG tablet Take 81 mg by mouth daily.   Yes Historical Provider, MD  DANDELION PO Take 1 capsule by mouth every morning.   Yes Historical Provider, MD  diphenhydrAMINE (SLEEP AID, DIPHENHYDRAMINE,) 25 MG tablet Take 25 mg by mouth at bedtime as needed and may repeat dose one time if needed for sleep.   Yes Historical Provider, MD  doxylamine, Sleep, (UNISOM) 25 MG tablet Take 50 mg by mouth at bedtime as needed.   Yes Historical Provider, MD    Gemcitabine HCl (GEMZAR IV) Inject into the vein. To begin June 6   Yes Historical Provider, MD  hydrocerin (EUCERIN) CREA Apply 1 application topically 2 (two) times daily as needed (For dry skin.).   Yes Historical Provider, MD  lidocaine-prilocaine (EMLA) cream Apply a quarter size amount to port site 1 hour prior to chemo. Do not rub in. Cover with plastic wrap. 12/02/15  Yes Patrici Ranks, MD  lipase/protease/amylase (CREON) 36000 UNITS CPEP capsule Take 1 capsule (36,000 Units total) by mouth 3 (three) times daily before meals. 08/04/16  Yes Holley Bouche, NP  metFORMIN (GLUCOPHAGE) 500 MG tablet Take 1 tablet (500 mg total) by mouth daily with breakfast. 06/10/16  Yes Patrici Ranks, MD  Multiple Vitamins-Minerals (MULTIVITAMINS THER. W/MINERALS) TABS tablet Take 1 tablet by mouth daily. Reported on 12/09/2015   Yes Historical Provider, MD  oxyCODONE (OXYCONTIN) 15 mg 12 hr tablet Take 1 tablet (15 mg total) by mouth every 8 (eight) hours. 08/11/16  Yes Baird Cancer, PA-C  Oxycodone HCl 10 MG TABS Take 1 tablet (10 mg total) by mouth every 4 (four) hours as needed. 08/11/16  Yes Baird Cancer, PA-C  PACLitaxel Protein-Bound Part (ABRAXANE IV) Inject into the vein. To begin June 6   Yes Historical Provider, MD  pantoprazole (PROTONIX) 40 MG tablet Take 1 tablet (40 mg total) by mouth daily. 07/09/16  Yes Rogene Houston, MD  polyethylene glycol (MIRALAX / GLYCOLAX) packet Take 17 g by mouth daily as needed.   Yes Historical Provider, MD  senna-docusate (SENOKOT S) 8.6-50 MG tablet Take 2 tablets by mouth daily as needed for mild constipation.    Yes Historical Provider, MD  fluconazole (DIFLUCAN) 100 MG tablet Take 1 tablet (100 mg total) by mouth daily. Patient not taking: Reported on 08/15/2016 07/28/16   Holley Bouche, NP     Allergies:    No Known Allergies   Physical Exam:   Vitals  Blood pressure 157/94, pulse 107, temperature 97.7 F (36.5 C), temperature source Oral,  resp. rate 18, height 5\' 11"  (1.803 m), weight 51.3 kg (113 lb), SpO2 97 %.  1.  General: Cachectic appearing male in mild distress from pain  2. Psychiatric:  Intact judgement  and  insight, awake alert, oriented x 3.  3. Neurologic: No focal neurological deficits, all cranial nerves intact.Strength 5/5 all 4 extremities, sensation intact all 4 extremities, plantars down going.  4. Eyes :  anicteric sclerae, moist conjunctivae with no lid lag. PERRLA.  5. ENMT:  Oropharynx clear with moist mucous membranes and good dentition  6. Neck:  supple, no cervical lymphadenopathy appriciated, No thyromegaly  7. Respiratory : Normal respiratory effort, good air movement bilaterally,clear to  auscultation bilaterally  8. Cardiovascular : RRR, no gallops, rubs or murmurs, no leg edema  9. Gastrointestinal:  Positive bowel sounds, abdomen soft, positive generalized tenderness to palpation    10. Skin:  No cyanosis, normal texture and turgor, no rash, lesions or ulcers  11.Musculoskeletal:  Good muscle tone,  joints appear normal , no effusions,  normal range of motion    Data Review:    CBC  Recent Labs Lab 08/15/16 1820  WBC 17.9*  HGB 16.6  HCT 45.5  PLT 308  MCV 94.8  MCH 34.6*  MCHC 36.5*  RDW 14.4   ------------------------------------------------------------------------------------------------------------------  Chemistries   Recent Labs Lab 08/15/16 1820  NA 124*  K 5.2*  CL 84*  CO2 25  GLUCOSE 113*  BUN 21*  CREATININE 0.64  CALCIUM 9.4  AST 94*  ALT 82*  ALKPHOS 641*  BILITOT 1.0   ------------------------------------------------------------------------------------------------------------------  ------------------------------------------------------------------------------------------------------------------ GFR: Estimated Creatinine Clearance: 68.6 mL/min (by C-G formula based on SCr of 0.64 mg/dL). Liver Function Tests:  Recent Labs Lab  08/15/16 1820  AST 94*  ALT 82*  ALKPHOS 641*  BILITOT 1.0  PROT 7.3  ALBUMIN 3.3*    Recent Labs Lab 08/15/16 1820  LIPASE 21    --------------------------------------------------------------------------------------------------------------- Urine analysis:    Component Value Date/Time   COLORURINE YELLOW 12/03/2015 0235   APPEARANCEUR HAZY (A) 12/03/2015 0235   LABSPEC <1.005 (L) 12/03/2015 0235   PHURINE 7.0 12/03/2015 0235   GLUCOSEU NEGATIVE 12/03/2015 0235   HGBUR SMALL (A) 12/03/2015 0235   BILIRUBINUR NEGATIVE 12/03/2015 0235   KETONESUR NEGATIVE 12/03/2015 0235   PROTEINUR NEGATIVE 12/03/2015 0235   NITRITE NEGATIVE 12/03/2015 0235   LEUKOCYTESUR NEGATIVE 12/03/2015 0235      Imaging Results:       Assessment & Plan:    Active Problems:   Hyponatremia   Abdominal pain, acute, generalized   Nausea & vomiting   Malignant neoplasm of body of pancreas ([place])   1. Hyponatremia- chronic, last sodium on 08/04/2016 was 124. At that time patient did not want her to stay in the hospital. [facility] place under observation, start IV normal saline, check serum osmolarity. Follow BMP in a.m. 2. Abdominal pain- patient has chronic abdominal pain from pancreatic cancer, continue OxyContin 50 mg by mouth every 8 hours. We'll start Dilaudid 1 mg every 2 hours when necessary for pain. 3. Nausea and vomiting- secondary to pancreatic cancer, start Zofran when necessary for nausea and vomiting. 4. Hyperkalemia- mild elevation of potassium 5.2, will start IV normal saline and follow BMP in a.m. 5. Stage IV pancreatic cancer- patient has stage IV adenocarcinoma of pancreas, with metastasis to liver. CA-19-9 elevated 614. Both oncology at [facility] and [facility] have recommended comfort measures, patient at this time is not ready to go for hospice. He would like to discuss with [facility] oncology before considering hospice. [facility] consult oncology in a.m. 6. History of TB,  treated by [facility]- Patient had positive AFB culture in June 2017  DNA probe confirmed negative for M tuberculosis complex and positive for M avium complex.  DVT Prophylaxis-   Lovenox   AM Labs Ordered, also please review Full Orders  Family Communication: Admission, patients condition and plan of care including tests being ordered have been discussed with the patient* who indicate understanding and agree with the plan and Code Status.  Code Status:  Full code  Admission status: Observation    Time spent in minutes : 60 minutes   LAMA,GAGAN S M.D on 08/15/2016 at 10:24 PM  Between 7am to 7pm - Pager - [phone]. After 7pm go to [email or web address] - password [facility]  [facility] - Office  [phone]

## 2016-08-16 DIAGNOSIS — C251 Malignant neoplasm of body of pancreas: Secondary | ICD-10-CM

## 2016-08-16 DIAGNOSIS — E871 Hypo-osmolality and hyponatremia: Secondary | ICD-10-CM

## 2016-08-16 LAB — CBC
HCT: 40.6 % (ref 39.0–52.0)
Hemoglobin: 14.2 g/dL (ref 13.0–17.0)
MCH: 33.6 pg (ref 26.0–34.0)
MCHC: 35 g/dL (ref 30.0–36.0)
MCV: 96.2 fL (ref 78.0–100.0)
PLATELETS: 288 10*3/uL (ref 150–400)
RBC: 4.22 MIL/uL (ref 4.22–5.81)
RDW: 14.8 % (ref 11.5–15.5)
WBC: 19.5 10*3/uL — ABNORMAL HIGH (ref 4.0–10.5)

## 2016-08-16 LAB — COMPREHENSIVE METABOLIC PANEL
ALT: 72 U/L — AB (ref 17–63)
ANION GAP: 12 (ref 5–15)
AST: 88 U/L — ABNORMAL HIGH (ref 15–41)
Albumin: 2.6 g/dL — ABNORMAL LOW (ref 3.5–5.0)
Alkaline Phosphatase: 539 U/L — ABNORMAL HIGH (ref 38–126)
BUN: 21 mg/dL — ABNORMAL HIGH (ref 6–20)
CO2: 23 mmol/L (ref 22–32)
CREATININE: 0.52 mg/dL — AB (ref 0.61–1.24)
Calcium: 8.1 mg/dL — ABNORMAL LOW (ref 8.9–10.3)
Chloride: 88 mmol/L — ABNORMAL LOW (ref 101–111)
GFR calc non Af Amer: >60 mL/min (ref >60)
Glucose, Bld: 102 mg/dL — ABNORMAL HIGH (ref 65–99)
POTASSIUM: 4.7 mmol/L (ref 3.5–5.1)
SODIUM: 123 mmol/L — AB (ref 135–145)
Total Bilirubin: 1 mg/dL (ref 0.3–1.2)
Total Protein: 5.6 g/dL — ABNORMAL LOW (ref 6.5–8.1)

## 2016-08-16 LAB — OSMOLALITY: Osmolality: 261 mOsm/kg — ABNORMAL LOW (ref 275–295)

## 2016-08-16 LAB — GLUCOSE, CAPILLARY
GLUCOSE-CAPILLARY: 144 mg/dL — AB (ref 65–99)
GLUCOSE-CAPILLARY: 85 mg/dL (ref 65–99)
Glucose-Capillary: 101 mg/dL — ABNORMAL HIGH (ref 65–99)

## 2016-08-16 NOTE — Progress Notes (Signed)
Discharged to home with sister

## 2016-08-16 NOTE — Discharge Summary (Signed)
Physician Discharge Summary  [name] [id number] DOB: July 11, 1950 DOA: 08/15/2016  PCP: Pcp Not In System  Admit date: 08/15/2016 Discharge date: 08/16/2016  Time spent: 45 minutes  Recommendations for Outpatient Follow-up:  -Will be discharged home today. -Advised to follow up with oncologist as scheduled.   Discharge Diagnoses:  Active Problems:   Hyponatremia   Abdominal pain, acute, generalized   Nausea & vomiting   Malignant neoplasm of body of pancreas [facility])   Discharge Condition: Guarded, poor long term prognosis  Filed Weights   08/15/16 1812 08/15/16 2338  Weight: 51.3 kg (113 lb) 50 kg (110 lb 4.8 oz)    History of present illness:  As per Dr. Darrick Meigs on 2/11: Nicholas [name]  is a 66 y.o. male, with stage IV adenocarcinoma of pancreas, currently not on chemotherapy. Patient was seen at [facility] and was told that he should be off for comfort measures only as there is no treatment for advanced stage of cancer. Today came to the ED for abdominal pain poor by mouth intake with intermittent nausea and vomiting. He denies diarrhea. No fever or chills. No chest pain. Complains of shortness of breath on exertion. Patient is on OxyContin 50 mg by mouth every 8 hours and oxycodone 10 mg every 4 hours when necessary for pain. In the ED lab work showed sodium 124, potassium 5.2.  Hospital Course:   Hyponatremia -Na remains low at 123. -Likely due to dehydration and malnutrition from advanced pancreatic cancer. -Patient is adamant to be discharged home today because "the food at home is better and I am going to die soon anyways".  Stage IV Pancreatic Cancer -Onc has recommended hospice; he is not interested at this time.  Procedures:  None   Consultations:  None  Discharge Instructions  Discharge Instructions    Diet - low sodium heart healthy    Complete by:  As directed    Increase activity slowly    Complete by:  As directed       Allergies as of 08/16/2016   No Known Allergies     Medication List    STOP taking these medications   DANDELION PO   fluconazole 100 MG tablet Commonly known as:  DIFLUCAN     TAKE these medications   ABRAXANE IV Inject into the vein. To begin June 6   aspirin EC 81 MG tablet Take 81 mg by mouth daily.   GEMZAR IV Inject into the vein. To begin June 6   hydrocerin Crea Apply 1 application topically 2 (two) times daily as needed (For dry skin.).   lidocaine-prilocaine cream Commonly known as:  EMLA Apply a quarter size amount to port site 1 hour prior to chemo. Do not rub in. Cover with plastic wrap.   lipase/protease/amylase 36000 UNITS Cpep capsule Commonly known as:  CREON Take 1 capsule (36,000 Units total) by mouth 3 (three) times daily before meals.   metFORMIN 500 MG tablet Commonly known as:  GLUCOPHAGE Take 1 tablet (500 mg total) by mouth daily with breakfast.   multivitamins ther. w/minerals Tabs tablet Take 1 tablet by mouth daily. Reported on 12/09/2015   Oxycodone HCl 10 MG Tabs Take 1 tablet (10 mg total) by mouth every 4 (four) hours as needed.   oxyCODONE 15 mg 12 hr tablet Commonly known as:  OXYCONTIN Take 1 tablet (15 mg total) by mouth every 8 (eight) hours.   pantoprazole 40 MG tablet Commonly known as:  PROTONIX Take  1 tablet (40 mg total) by mouth daily.   polyethylene glycol packet Commonly known as:  MIRALAX / GLYCOLAX Take 17 g by mouth daily as needed.   SENOKOT S 8.6-50 MG tablet Generic drug:  senna-docusate Take 2 tablets by mouth daily as needed for mild constipation.   SLEEP AID (DIPHENHYDRAMINE) 25 MG tablet Generic drug:  diphenhydrAMINE Take 25 mg by mouth at bedtime as needed and may repeat dose one time if needed for sleep.   UNISOM 25 MG tablet Generic drug:  doxylamine (Sleep) Take 50 mg by mouth at bedtime as needed.      No Known Allergies Follow-up Information    your regular physician. Schedule an  appointment as soon as possible for a visit in 2 week(s).            The results of significant diagnostics from this hospitalization (including imaging, microbiology, ancillary and laboratory) are listed below for reference.    Significant Diagnostic Studies: No results found.  Microbiology: No results found for this or any previous visit (from the past 240 hour(s)).   Labs: Basic Metabolic Panel:  Recent Labs Lab 08/15/16 1820 08/16/16 0548  NA 124* 123*  K 5.2* 4.7  CL 84* 88*  CO2 25 23  GLUCOSE 113* 102*  BUN 21* 21*  CREATININE 0.64 0.52*  CALCIUM 9.4 8.1*   Liver Function Tests:  Recent Labs Lab 08/15/16 1820 08/16/16 0548  AST 94* 88*  ALT 82* 72*  ALKPHOS 641* 539*  BILITOT 1.0 1.0  PROT 7.3 5.6*  ALBUMIN 3.3* 2.6*    Recent Labs Lab 08/15/16 1820  LIPASE 21   No results for input(s): AMMONIA in the last 168 hours. CBC:  Recent Labs Lab 08/15/16 1820 08/16/16 0548  WBC 17.9* 19.5*  HGB 16.6 14.2  HCT 45.5 40.6  MCV 94.8 96.2  PLT 308 288   Cardiac Enzymes: No results for input(s): CKTOTAL, CKMB, CKMBINDEX, TROPONINI in the last 168 hours. BNP: BNP (last 3 results) No results for input(s): BNP in the last 8760 hours.  ProBNP (last 3 results) No results for input(s): PROBNP in the last 8760 hours.  CBG:  Recent Labs Lab 08/16/16 0034 08/16/16 0751 08/16/16 1154  GLUCAP 144* 101* 85       Signed:  HERNANDEZ ACOSTA,Ivah Girardot  [facility] Pager: [phone] 08/16/2016, 5:10 PM

## 2016-08-16 NOTE — Progress Notes (Signed)
IV removed. Discharge instructions reviewed with patient. Understanding verbalized.

## 2016-08-17 LAB — HEMOGLOBIN A1C
HEMOGLOBIN A1C: 5.2 % (ref 4.8–5.6)
MEAN PLASMA GLUCOSE: 103 mg/dL

## 2016-08-19 ENCOUNTER — Encounter (HOSPITAL_COMMUNITY): Payer: Self-pay | Admitting: Adult Health

## 2016-08-19 ENCOUNTER — Encounter (HOSPITAL_COMMUNITY): Payer: Medicaid Other | Attending: Hematology & Oncology | Admitting: Adult Health

## 2016-08-19 VITALS — BP 137/93 | HR 117 | Temp 97.7°F | Resp 22

## 2016-08-19 DIAGNOSIS — R143 Flatulence: Secondary | ICD-10-CM | POA: Insufficient documentation

## 2016-08-19 DIAGNOSIS — K5900 Constipation, unspecified: Secondary | ICD-10-CM | POA: Insufficient documentation

## 2016-08-19 DIAGNOSIS — C251 Malignant neoplasm of body of pancreas: Secondary | ICD-10-CM | POA: Insufficient documentation

## 2016-08-19 DIAGNOSIS — K7689 Other specified diseases of liver: Secondary | ICD-10-CM | POA: Insufficient documentation

## 2016-08-19 DIAGNOSIS — R0602 Shortness of breath: Secondary | ICD-10-CM | POA: Diagnosis not present

## 2016-08-19 DIAGNOSIS — C787 Secondary malignant neoplasm of liver and intrahepatic bile duct: Secondary | ICD-10-CM | POA: Diagnosis not present

## 2016-08-19 DIAGNOSIS — Z7189 Other specified counseling: Secondary | ICD-10-CM

## 2016-08-19 DIAGNOSIS — K869 Disease of pancreas, unspecified: Secondary | ICD-10-CM | POA: Insufficient documentation

## 2016-08-19 DIAGNOSIS — R531 Weakness: Secondary | ICD-10-CM

## 2016-08-19 DIAGNOSIS — C258 Malignant neoplasm of overlapping sites of pancreas: Secondary | ICD-10-CM | POA: Insufficient documentation

## 2016-08-19 DIAGNOSIS — E876 Hypokalemia: Secondary | ICD-10-CM | POA: Insufficient documentation

## 2016-08-19 DIAGNOSIS — Z8611 Personal history of tuberculosis: Secondary | ICD-10-CM | POA: Insufficient documentation

## 2016-08-19 NOTE — Progress Notes (Signed)
[place]:  Medical Oncology/Hematology  PCP:  Pcp Not In System   REASON FOR VISIT:  Follow-up for Stage IV pancreatic cancer    BRIEF ONCOLOGIC HISTORY:    Pancreatic cancer ([place])   11/11/2015 - 11/13/2015 Hospital Admission    abdominal pain, cramping, diarrhea, weight loss, hyponatremia CT pancreatic body mass      11/12/2015 Imaging    4.7 cm mass of pancreas to L of midline, involvement of several vascular structures also possibly the distal duodenum      11/21/2015 - 11/25/2015 Hospital Admission    EUS on 5/23, hyponatremia      11/22/2015 Imaging    Diffuse mesenteric edema, large hypo enhancing pancreatic mass, abutment of celiac axis, SMA, encasement of splenic artery, occludes splenic vein, compresses porta splenic confluence      11/25/2015 Procedure    EUS Dr. Ardis Hughs, irreg mass in pancreatic body/tail. 4.2 cm, involvement of major blood vessels not appreciated due to size of mass      11/25/2015 Pathology Results    malignant cells c/w adenocarcinoma      12/04/2015 Miscellaneous    Acid Fast Smear negative      12/04/2015 Imaging    No convincing evidence of pulm mets, improvement in LLL, RUL angular nodular thickening seen on comparison CT from 2013, residual cavitation remains at the L lung base, one nodule oblique fissue on R 19m new      12/08/2015 Procedure    Port a cath      12/09/2015 -  Chemotherapy    Gemzar/Abraxane days 1, 8, and 15 every 28 days.      12/12/2015 Imaging    MRI abd- Limited MRI, D/C prior to completion at patient request. Large infiltrative 8.1 x 4.7 x 5.8 cm pancreatic mass centered at the junction of the pancreatic body and tail, consistent with known primary pancreatic adenocarc      12/23/2015 Treatment Plan Change    Treatment deferred x 7 days due to thrombocytopenia      01/14/2016 Imaging    CT abd/pelvis- Mass in the body/ tail of the pancreas consistent with known pancreatic carcinoma. Likely  metastases to the liver demonstrating some progression since previous study.      02/26/2016 Treatment Plan Change    Day 15 deleted from treatment plan.  Chemotherapy is now days 1, 8 every 28 days.      05/06/2016 Imaging    CT abd/pelvis- 1. Mixed response to therapy. 2. Decrease in size of tumor within the tail of pancreas. 3. Several new liver metastases are identified. Previously noted liver metastasis have resolved or calcified.       HISTORY OF PRESENTING ILLNESS:  Nicholas TEJERA66y.o. male is here for follow-up for Stage IV pancreatic cancer. He has chronic pulmonary MAC; he is a smoker.   [facility] Dr. CAdrian Prowsfor consideration for additional treatment options for his pancreatic cancer. [facility] recommended no further anti-cancer treatment and transition to best supportive care with the help of hospice. He was resistant to considering hospice at that time.   His tumor markers continue to rise; last value 614 on 08/04/16.    He did report to the ED and was hospitalized for 1 night for hyponatremia, hyperkalemia, abdominal pain, N&V.   Today, he is here with his sister. He feels really weak. It is difficult for him to eat. His abdominal pain "is  okay right now", but historically has been difficult to manage.  Reports occasional shortness of breath.  His abdomen feels distended to him.  He has occasional nausea & vomiting.  Clinically, he appears to have lost more weight; unable to weigh him today due to weakness.  He is seen seated in wheelchair.      MEDICAL HISTORY:  Past Medical History:  Diagnosis Date  . Abdominal pain in male    "was told he has a Mass near pancreas"  . Cancer (HCC)    Skin cancer-ear, skin cancer -leeg,cancer of leg muscle- left., basal cell   . Dyspnea    From previous TB  . Pancreatic cancer ([place]) 12/02/2015  . Pancreatic mass april 2017  . Pre-diabetes   . TB (pulmonary tuberculosis)    3 yrs ago-tx"  positive TB test aslso" was told he is not contagious.treatment done for regulat TB.    SURGICAL HISTORY: Past Surgical History:  Procedure Laterality Date  . BIOPSY  07/09/2016   Procedure: BIOPSY;  Surgeon: Rogene Houston, MD;  Location: [facility];  Service: Endoscopy;;  gastric biopsies of antrum,   . COLONOSCOPY W/ POLYPECTOMY    . ESOPHAGOGASTRODUODENOSCOPY (EGD) WITH PROPOFOL N/A 07/09/2016   Procedure: ESOPHAGOGASTRODUODENOSCOPY (EGD) WITH PROPOFOL;  Surgeon: Rogene Houston, MD;  Location: [facility];  Service: Endoscopy;  Laterality: N/A;  7:30  . EUS N/A 11/25/2015   Procedure: ESOPHAGEAL ENDOSCOPIC ULTRASOUND (EUS) RADIAL;  Surgeon: Milus Banister, MD;  Location: [facility];  Service: Endoscopy;  Laterality: N/A;  . LEG SURGERY Left    " cancer removed from muscle"    SOCIAL HISTORY: Social History   Social History  . Marital status: Single    Spouse name: N/A  . Number of children: N/A  . Years of education: N/A   Occupational History  . Not on file.   Social History Main Topics  . Smoking status: Current Every Day Smoker    Packs/day: 0.50    Years: 30.00    Types: Cigarettes  . Smokeless tobacco: Never Used  . Alcohol use No     Comment: 1-2 beers at night - pt states he can't remember the last time he drank any alcohol  . Drug use: No  . Sexual activity: No   Other Topics Concern  . Not on file   Social History Narrative  . No narrative on file   Single. He has been engaged 4 times. No children He has cut his smoking back since he had tuberculosis. He has smoked more with recent pain. Sometimes 1 ppd, "it varies". Since last Thursday, he has had 2 beers. It does not taste right. He has never had acid reflux before, but now he does. He was in the [employer]. Worked in Office manager, [place], and the post office.   FAMILY HISTORY: Family History  Problem Relation Age of Onset  . Stroke Mother   . Pneumonia Father    Mother living 38  years old at [employer] [facility] and rehabilitation center.  Father deceased at 20 years old of pneumonia. He drank and drank a lot. [employer] English as a second language teacher. Had a massive heart attack at 38 yo 1 sister, lives in [place]:   No Known Allergies   MEDICATIONS:  Current Outpatient Prescriptions  Medication Sig Dispense Refill  . aspirin EC 81 MG tablet Take 81 mg by mouth daily.    . diphenhydrAMINE (SLEEP AID, DIPHENHYDRAMINE,) 25 MG tablet Take 25 mg  by mouth at bedtime as needed and may repeat dose one time if needed for sleep.    Marland Kitchen doxylamine, Sleep, (UNISOM) 25 MG tablet Take 50 mg by mouth at bedtime as needed.    . Gemcitabine HCl (GEMZAR IV) Inject into the vein. To begin June 6    . hydrocerin (EUCERIN) CREA Apply 1 application topically 2 (two) times daily as needed (For dry skin.).    Marland Kitchen lidocaine-prilocaine (EMLA) cream Apply a quarter size amount to port site 1 hour prior to chemo. Do not rub in. Cover with plastic wrap. 30 g 3  . lipase/protease/amylase (CREON) 36000 UNITS CPEP capsule Take 1 capsule (36,000 Units total) by mouth 3 (three) times daily before meals. 180 capsule 6  . metFORMIN (GLUCOPHAGE) 500 MG tablet Take 1 tablet (500 mg total) by mouth daily with breakfast. 30 tablet 1  . Multiple Vitamins-Minerals (MULTIVITAMINS THER. W/MINERALS) TABS tablet Take 1 tablet by mouth daily. Reported on 12/09/2015    . oxyCODONE (OXYCONTIN) 15 mg 12 hr tablet Take 1 tablet (15 mg total) by mouth every 8 (eight) hours. 90 tablet 0  . Oxycodone HCl 10 MG TABS Take 1 tablet (10 mg total) by mouth every 4 (four) hours as needed. 120 tablet 0  . PACLitaxel Protein-Bound Part (ABRAXANE IV) Inject into the vein. To begin June 6    . pantoprazole (PROTONIX) 40 MG tablet Take 1 tablet (40 mg total) by mouth daily. 60 tablet 5  . polyethylene glycol (MIRALAX / GLYCOLAX) packet Take 17 g by mouth daily as needed.    . senna-docusate (SENOKOT S) 8.6-50 MG tablet Take 2 tablets by mouth daily as  needed for mild constipation.      No current facility-administered medications for this visit.     REVIEW OF SYSTEMS:  Review of Systems  Constitutional: Positive for malaise/fatigue and weight loss.       Poor appetite  HENT: Negative.   Eyes: Negative.   Respiratory: Positive for shortness of breath.        Chronic SOB  Cardiovascular: Negative.   Gastrointestinal: Positive for abdominal pain, nausea and vomiting.       Sharp pains to abdomen   Genitourinary: Negative.   Musculoskeletal: Negative.   Skin: Negative.   Neurological: Positive for weakness.  Endo/Heme/Allergies: Negative.   All other systems reviewed and are negative. 14 point ROS was done and is otherwise as detailed above or in HPI      PHYSICAL EXAMINATION: ECOG PERFORMANCE STATUS: 3 - Symptomatic, >50% confined to bed   Vitals:   08/19/16 1011  BP: (!) 137/93  Pulse: (!) 117  Resp: (!) 22  Temp: 97.7 F (36.5 C)     Physical Exam  Constitutional: He is oriented to person, place, and time. No distress.  Emaciated/cachetic male   HENT:  Head: Normocephalic.  Eyes: Conjunctivae are normal. No scleral icterus.  Neck: Normal range of motion. Neck supple. No JVD present. No tracheal deviation present. No thyromegaly present.  Cardiovascular: Regular rhythm and normal heart sounds.  Tachycardia present.   Tachycardic  Pulmonary/Chest: Effort normal. No stridor. He has no wheezes.  Diminished breath sounds throughout   Abdominal: Soft. Bowel sounds are normal. He exhibits distension. There is tenderness.  Musculoskeletal: He exhibits no edema.  Neurological: He is oriented to person, place, and time.  Skin: Skin is warm and dry. There is pallor.  Bilateral hands with mottling   Psychiatric: Mood, memory, affect and judgment normal.  Nursing  note and vitals reviewed.    LABORATORY DATA:  I have reviewed the data as listed Lab Results  Component Value Date   WBC 19.5 (H) 08/16/2016   HGB  14.2 08/16/2016   HCT 40.6 08/16/2016   MCV 96.2 08/16/2016   PLT 288 08/16/2016   CMP     Component Value Date/Time   NA 123 (L) 08/16/2016 0548   K 4.7 08/16/2016 0548   CL 88 (L) 08/16/2016 0548   CO2 23 08/16/2016 0548   GLUCOSE 102 (H) 08/16/2016 0548   BUN 21 (H) 08/16/2016 0548   CREATININE 0.52 (L) 08/16/2016 0548   CALCIUM 8.1 (L) 08/16/2016 0548   PROT 5.6 (L) 08/16/2016 0548   ALBUMIN 2.6 (L) 08/16/2016 0548   AST 88 (H) 08/16/2016 0548   ALT 72 (H) 08/16/2016 0548   ALKPHOS 539 (H) 08/16/2016 0548   BILITOT 1.0 08/16/2016 0548   GFRNONAA >60 08/16/2016 0548   GFRAA >60 08/16/2016 0548    Results for Nicholas [name], Nicholas [name] (MRN [id number]) as of 08/04/2016   Ref. Range 01/21/2016 08:57 02/12/2016 10:09 03/04/2016 09:37 03/25/2016 09:51 04/01/2016 10:58 04/22/2016 11:16 05/14/2016 10:41 06/03/2016 09:22 07/01/2016 09:53  CA 19-9 Latest Ref Range: 0 - 35 U/mL 1,367 (H) 961 (H) 548 (H) 300 (H) 228 (H) 149 (H) 141 (H) 126 (H) 180 (H)       Results for Nicholas [name], Nicholas [name] (MRN [id number])   Ref. Range 05/14/2016 12:36  Folate Latest Ref Range: >5.9 ng/mL 53.1  Vitamin B12 Latest Ref Range: 180 - 914 pg/mL 1,086 (H)     PATHOLOGY:     RADIOGRAPHIC STUDIES: I have personally reviewed the radiological images as listed and agreed with the findings in the report.  Study Result   CLINICAL DATA:  Restaging pancreas cancer  EXAM: CT ABDOMEN AND PELVIS WITH CONTRAST  TECHNIQUE: Multidetector CT imaging of the abdomen and pelvis was performed using the standard protocol following bolus administration of intravenous contrast.  CONTRAST:  31m ISOVUE-300 IOPAMIDOL (ISOVUE-300) INJECTION 61%, 1020mISOVUE-300 IOPAMIDOL (ISOVUE-300) INJECTION 61%  COMPARISON:  01/14/2016  FINDINGS: Lower chest: No acute abnormality.  Hepatobiliary: Within the posterior right lobe of liver there is a 11 mm low-attenuation lesion, 28 of series 2. New from previous exam. Treated  lesion within segment 5 of the liver measures 3 mm in appears calcified. Previously 10 mm and noncalcified. New lesion within the lateral right lobe of liver measures 1.5 cm, image 16 of series 2. Also new from previous exam is a 8 mm low-attenuation structure, image 14 of series 2. 5 mm lesion within the medial segment of left lobe of liver has resolved in the interval. The gallbladder is normal. No biliary dilatation.  Pancreas: Tumor involving the tail of pancreas measures 4.7 x 1.9 cm, image 20 of series 2. Previously 6.7 x 3.0 cm.  Spleen: Normal in size without focal abnormality.  Adrenals/Urinary Tract: Normal appearance of the right adrenal gland. Enlargement of the left adrenal gland appears similar to the previous exam, image 15 of series 2. The kidneys are unremarkable. There is mild ventral bladder wall thickening.  Stomach/Bowel: Stomach is within normal limits. Appendix appears normal. No evidence of bowel wall thickening, distention, or inflammatory changes.  Vascular/Lymphatic: Aortic atherosclerosis. No aneurysm. No upper abdominal adenopathy. There is no pelvic or inguinal adenopathy.  Reproductive: Prostate is unremarkable.  Other: A small amount of ascites is identified within the dependent portion of the pelvis. Decreased perihepatic ascites.  Musculoskeletal: Degenerative disc disease  noted within the lumbar spine. No aggressive lytic or sclerotic bone lesions.  IMPRESSION: 1. Mixed response to therapy. 2. Decrease in size of tumor within the tail of pancreas. 3. Several new liver metastases are identified. Previously noted liver metastasis have resolved or calcified.   Electronically Signed   By: Kerby Moors M.D.   On: 05/05/2016 15:54        EUS:        ASSESSMENT & PLAN:   Stage IV pancreatic cancer:  -He presents today with his sister for continued follow-up for Stage IV pancreatic cancer.  Clinically, he appears  emaciated and cachetic. He is more pale and with less energy than at previous visits.  He recently went to [facility] for consideration for additional treatment options; they recommended hospice care.  He was initially frustrated to learn this news, as he was hoping to meet with several specialists at [facility] that may be able to help him be able to get more treatment.  He also initially insisted that his tumor markers had been increasing because he didn't have chemotherapy because of his gastritis.  After further discussion, he understands that his symptoms are likely secondary to his metastatic cancer.  He is not a candidate for additional chemotherapy. I shared with him that I estimate his life expectancy to be about 3 months or less given his current condition. We had an additional goals of care discussion today (see below).   Goals of care discussion:  -Our visit today was largely spent discussing his current clinical situation; he and his sister are able to recognize that he is weak and is continuing to decline.  Clinically, he is emaciated and very weak; he appears to have lost more weight.  There are no additional treatment options available to him in his current condition.  He understands that giving chemotherapy in his condition could actually hasten his death.  We discussed a referral to hospice, which could include home hospice vs inpatient hospice.  We discussed the benefits of hospice including nursing support, management of his symptoms, being surrounded by his family in his final days, and helping him die with dignity.  When asked if he had ever thought about where he would like to die, he shared that he does not want to die in a hospital.  Dying in either his own home or an inpatient hospice house would be acceptable to him.  He understands that if his heart stops or he stops breathing at home, hospice will not do any heroic actions to try to revive him. They will not perform CPR or call 911; they will help  ensure he is comfortable as he dies.  He agrees to this. He does not want to be admitted to the hospital.  Right now, he would like to start with home hospice, with the understanding that if his condition deteriorates further where his family and home hospice are not able to care for him, then he can transition to an inpatient hospice facility when appropriate. This is very reasonable.  I discouraged him from going to the ER any further; his first call for any concerning symptoms should be to his hospice team.  Of course, we are available to answer any questions or concerns as well.  We will cancel all of his upcoming appointments at the cancer center.      Dispo: -Referral to Home Hospice. He will likely need admission to inpatient hospice at some point.  -Cancel all follow-up appointments at cancer  center. He knows he can call us with any questions or concerns.     No orders of the defined types were placed in this encounter.      Mike Craze, NP [place] [phone]

## 2016-08-20 ENCOUNTER — Emergency Department (HOSPITAL_COMMUNITY): Payer: Medicaid Other

## 2016-08-20 ENCOUNTER — Emergency Department (HOSPITAL_COMMUNITY)
Admission: EM | Admit: 2016-08-20 | Discharge: 2016-09-02 | Disposition: E | Payer: Medicaid Other | Attending: Emergency Medicine | Admitting: Emergency Medicine

## 2016-08-20 ENCOUNTER — Encounter (HOSPITAL_COMMUNITY): Payer: Self-pay | Admitting: Emergency Medicine

## 2016-08-20 DIAGNOSIS — C799 Secondary malignant neoplasm of unspecified site: Secondary | ICD-10-CM | POA: Diagnosis not present

## 2016-08-20 DIAGNOSIS — R109 Unspecified abdominal pain: Secondary | ICD-10-CM | POA: Diagnosis present

## 2016-08-20 DIAGNOSIS — Z7982 Long term (current) use of aspirin: Secondary | ICD-10-CM | POA: Diagnosis not present

## 2016-08-20 DIAGNOSIS — F17210 Nicotine dependence, cigarettes, uncomplicated: Secondary | ICD-10-CM | POA: Diagnosis not present

## 2016-08-20 DIAGNOSIS — Z79899 Other long term (current) drug therapy: Secondary | ICD-10-CM | POA: Diagnosis not present

## 2016-08-20 DIAGNOSIS — Z7984 Long term (current) use of oral hypoglycemic drugs: Secondary | ICD-10-CM | POA: Diagnosis not present

## 2016-08-20 DIAGNOSIS — C259 Malignant neoplasm of pancreas, unspecified: Secondary | ICD-10-CM | POA: Insufficient documentation

## 2016-08-20 LAB — COMPREHENSIVE METABOLIC PANEL
ALT: 181 U/L — AB (ref 17–63)
ANION GAP: 20 — AB (ref 5–15)
AST: 250 U/L — ABNORMAL HIGH (ref 15–41)
Albumin: 2.8 g/dL — ABNORMAL LOW (ref 3.5–5.0)
Alkaline Phosphatase: 686 U/L — ABNORMAL HIGH (ref 38–126)
BUN: 64 mg/dL — ABNORMAL HIGH (ref 6–20)
CHLORIDE: 88 mmol/L — AB (ref 101–111)
CO2: 19 mmol/L — ABNORMAL LOW (ref 22–32)
CREATININE: 1.42 mg/dL — AB (ref 0.61–1.24)
Calcium: 8.7 mg/dL — ABNORMAL LOW (ref 8.9–10.3)
GFR, EST AFRICAN AMERICAN: 59 mL/min — AB (ref >60)
GFR, EST NON AFRICAN AMERICAN: 51 mL/min — AB (ref >60)
Glucose, Bld: 75 mg/dL (ref 65–99)
Potassium: 6.8 mmol/L (ref 3.5–5.1)
Sodium: 127 mmol/L — ABNORMAL LOW (ref 135–145)
Total Bilirubin: 1.4 mg/dL — ABNORMAL HIGH (ref 0.3–1.2)
Total Protein: 6.1 g/dL — ABNORMAL LOW (ref 6.5–8.1)

## 2016-08-20 LAB — CBC WITH DIFFERENTIAL/PLATELET
BASOS ABS: 0 10*3/uL (ref 0.0–0.1)
Basophils Relative: 0 %
EOS ABS: 0 10*3/uL (ref 0.0–0.7)
Eosinophils Relative: 0 %
HCT: 47 % (ref 39.0–52.0)
HEMOGLOBIN: 15.9 g/dL (ref 13.0–17.0)
LYMPHS PCT: 4 %
Lymphs Abs: 0.9 10*3/uL (ref 0.7–4.0)
MCH: 32.6 pg (ref 26.0–34.0)
MCHC: 33.8 g/dL (ref 30.0–36.0)
MCV: 96.3 fL (ref 78.0–100.0)
Monocytes Absolute: 1.2 10*3/uL — ABNORMAL HIGH (ref 0.1–1.0)
Monocytes Relative: 5 %
NEUTROS ABS: 21.4 10*3/uL — AB (ref 1.7–7.7)
Neutrophils Relative %: 91 %
Platelets: 152 10*3/uL (ref 150–400)
RBC: 4.88 MIL/uL (ref 4.22–5.81)
RDW: 15.1 % (ref 11.5–15.5)
WBC: 23.5 10*3/uL — AB (ref 4.0–10.5)

## 2016-08-20 LAB — LACTIC ACID, PLASMA: LACTIC ACID, VENOUS: 8.5 mmol/L — AB (ref 0.5–1.9)

## 2016-08-20 LAB — LIPASE, BLOOD: LIPASE: 14 U/L (ref 11–51)

## 2016-08-20 MED ORDER — IPRATROPIUM-ALBUTEROL 0.5-2.5 (3) MG/3ML IN SOLN: 3.0000 mL | Freq: Once | RESPIRATORY_TRACT | Status: DC

## 2016-08-20 MED ORDER — FENTANYL CITRATE (PF) 100 MCG/2ML IJ SOLN
50.0000 ug | INTRAMUSCULAR | Status: DC | PRN
  Administered 2016-08-20: 50 ug via INTRAVENOUS
  Filled 2016-08-20: qty 2

## 2016-08-20 MED ORDER — FENTANYL CITRATE (PF) 100 MCG/2ML IJ SOLN: 50.0000 ug | INTRAMUSCULAR | Status: DC | PRN

## 2016-08-20 MED ORDER — HYDROMORPHONE HCL 1 MG/ML IJ SOLN
1.0000 mg | INTRAMUSCULAR | Status: DC | PRN
  Administered 2016-08-20: 1 mg via INTRAVENOUS
  Filled 2016-08-20 (×2): qty 1

## 2016-08-20 MED ORDER — SODIUM CHLORIDE 0.9 % IV SOLN
INTRAVENOUS | Status: DC
  Administered 2016-08-20: 100 mL/h via INTRAVENOUS

## 2016-08-20 MED ORDER — ALBUTEROL SULFATE (2.5 MG/3ML) 0.083% IN NEBU: 2.5000 mg | INHALATION_SOLUTION | Freq: Once | RESPIRATORY_TRACT | Status: DC

## 2016-08-25 ENCOUNTER — Ambulatory Visit (HOSPITAL_COMMUNITY): Payer: Medicaid Other

## 2016-08-25 ENCOUNTER — Other Ambulatory Visit (HOSPITAL_COMMUNITY): Payer: Medicaid Other

## 2016-09-01 ENCOUNTER — Ambulatory Visit (HOSPITAL_COMMUNITY): Payer: Medicaid Other

## 2016-09-01 ENCOUNTER — Other Ambulatory Visit (HOSPITAL_COMMUNITY): Payer: Medicaid Other

## 2016-09-02 NOTE — ED Notes (Signed)
Time of Death called at 1600.

## 2016-09-02 NOTE — ED Notes (Signed)
CRITICAL VALUE ALERT  Critical value received:  Potassium 6.8, Lactic Acid 8.5  Date of notification:  2016-08-22  Time of notification:  [id number]  Critical value read back:Yes.    Nurse who received alert:  RMinter, RN  MD notified (1st page):  Dr. Thurnell Garbe  Time of first page:  1505  MD notified (2nd page):  Time of second page:  Responding MD:  Dr. Thurnell Garbe  Time MD responded:  [phone]

## 2016-09-02 NOTE — ED Notes (Signed)
PT breathing becoming more shallow at this time. PT able to shake his head yes/no appropriately for questioning at this time. Sister Hassan Rowan- POA) at bedside at this time and confirmed pt is a DNR. Pallative care NP arrived at this time and speaking with family.

## 2016-09-02 NOTE — ED Notes (Signed)
PT confirmed with I and Vivien Rossetti, RN that he wished to be a DNR.

## 2016-09-02 NOTE — ED Notes (Signed)
Dixon Boos, PA called at this time and the cancer center will sign the death certificate.

## 2016-09-02 NOTE — ED Notes (Signed)
Pt information put into IDX and Filed, and Spoke with Shaun in Patient Placement.

## 2016-09-02 NOTE — ED Notes (Signed)
[facility]: Nicholas [name] stated that we could release the body to their funeral home of choice at this time.

## 2016-09-02 NOTE — ED Provider Notes (Signed)
[facility] DEPT Provider Note   CSN: [id number] Arrival date & time: 2016/08/26  1314     History   Chief Complaint Chief Complaint  Patient presents with  . Abdominal Pain  . Pancreatic Cancer    HPI Nicholas [name] is a 66 y.o. male.   Abdominal Pain    Pt was seen at 1410. Per EMS and pt report, c/o gradual onset and worsening of constant acute flair of his chronic abd "pain" for the past day. Pt has hx of metastatic pancreatic cancer. Pt was told by [facility] Heme/Onc MD there was no further tx options available. Pt was evaluated by [place] yesterday: finally agreed to DNR and Hospice Care. Hospice is due to come to his home on Monday, but pt states he "is in too much pain." Pt requesting pain control. Confirms DNR/DNI status. Denies N/V/D, no fevers, no CP.    Past Medical History:  Diagnosis Date  . Abdominal pain in male    "was told he has a Mass near pancreas"  . Cancer (HCC)    Skin cancer-ear, skin cancer -leeg,cancer of leg muscle- left., basal cell   . Dyspnea    From previous TB  . Pancreatic cancer ([place]) 12/02/2015  . Pancreatic mass april 2017  . Pre-diabetes   . TB (pulmonary tuberculosis)    3 yrs ago-tx" positive TB test aslso" was told he is not contagious.treatment done for regulat TB.    Patient Active Problem List   Diagnosis Date Noted  . Malignant neoplasm of body of pancreas ([place]) 06/08/2016  . Abdominal pain, epigastric 06/08/2016  . 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 SBO (small bowel obstruction) 01/14/2016  . Nausea & vomiting 01/14/2016  . Black tarry stools 12/23/2015  . Pancreatic cancer ([place]) 12/02/2015  . Abdominal pain, acute, generalized 11/22/2015  . Abdominal pain 11/22/2015  . Hyponatremia 11/12/2015  . Tobacco use disorder 11/12/2015    Past Surgical History:  Procedure Laterality Date  . BIOPSY  07/09/2016   Procedure: BIOPSY;  Surgeon: Rogene Houston, MD;  Location: [facility];  Service:  Endoscopy;;  gastric biopsies of antrum,   . COLONOSCOPY W/ POLYPECTOMY    . ESOPHAGOGASTRODUODENOSCOPY (EGD) WITH PROPOFOL N/A 07/09/2016   Procedure: ESOPHAGOGASTRODUODENOSCOPY (EGD) WITH PROPOFOL;  Surgeon: Rogene Houston, MD;  Location: [facility];  Service: Endoscopy;  Laterality: N/A;  7:30  . EUS N/A 11/25/2015   Procedure: ESOPHAGEAL ENDOSCOPIC ULTRASOUND (EUS) RADIAL;  Surgeon: Milus Banister, MD;  Location: [facility] ENDOS[facility];  Service: Endoscopy;  Laterality: N/A;  . LEG SURGERY Left    " cancer removed from muscle"       Home Medications    Prior to Admission medications   Medication Sig Start Date End Date Taking? Authorizing Provider  aspirin EC 81 MG tablet Take 81 mg by mouth daily.    Historical Provider, MD  diphenhydrAMINE (SLEEP AID, DIPHENHYDRAMINE,) 25 MG tablet Take 25 mg by mouth at bedtime as needed and may repeat dose one time if needed for sleep.    Historical Provider, MD  doxylamine, Sleep, (UNISOM) 25 MG tablet Take 50 mg by mouth at bedtime as needed.    Historical Provider, MD  Gemcitabine HCl (GEMZAR IV) Inject into the vein. To begin June 6    Historical Provider, MD  hydrocerin (EUCERIN) CREA Apply 1 application topically 2 (two) times daily as needed (For dry skin.).    Historical Provider, MD  lidocaine-prilocaine (EMLA) cream Apply a  quarter size amount to port site 1 hour prior to chemo. Do not rub in. Cover with plastic wrap. Patient not taking: Reported on 08/19/2016 12/02/15   Patrici Ranks, MD  lipase/protease/amylase (CREON) 36000 UNITS CPEP capsule Take 1 capsule (36,000 Units total) by mouth 3 (three) times daily before meals. Patient not taking: Reported on 08/19/2016 08/04/16   Holley Bouche, NP  metFORMIN (GLUCOPHAGE) 500 MG tablet Take 1 tablet (500 mg total) by mouth daily with breakfast. Patient not taking: Reported on 08/19/2016 06/10/16   Patrici Ranks, MD  Multiple Vitamins-Minerals (MULTIVITAMINS THER. W/MINERALS) TABS tablet Take  1 tablet by mouth daily. Reported on 12/09/2015    Historical Provider, MD  oxyCODONE (OXYCONTIN) 15 mg 12 hr tablet Take 1 tablet (15 mg total) by mouth every 8 (eight) hours. 08/11/16   Baird Cancer, PA-C  Oxycodone HCl 10 MG TABS Take 1 tablet (10 mg total) by mouth every 4 (four) hours as needed. 08/11/16   Baird Cancer, PA-C  PACLitaxel Protein-Bound Part (ABRAXANE IV) Inject into the vein. To begin June 6    Historical Provider, MD  pantoprazole (PROTONIX) 40 MG tablet Take 1 tablet (40 mg total) by mouth daily. Patient not taking: Reported on 08/19/2016 07/09/16   Rogene Houston, MD  polyethylene glycol (MIRALAX / GLYCOLAX) packet Take 17 g by mouth daily as needed.    Historical Provider, MD  senna-docusate (SENOKOT S) 8.6-50 MG tablet Take 2 tablets by mouth daily as needed for mild constipation.     Historical Provider, MD    Family History Family History  Problem Relation Age of Onset  . Stroke Mother   . Pneumonia Father     Social History Social History  Substance Use Topics  . Smoking status: Current Every Day Smoker    Packs/day: 0.50    Years: 30.00    Types: Cigarettes  . Smokeless tobacco: Never Used  . Alcohol use No     Comment: 1-2 beers at night - pt states he can't remember the last time he drank any alcohol     Allergies   Patient has no known allergies.   Review of Systems Review of Systems  Gastrointestinal: Positive for abdominal pain.  ROS: Statement: All systems negative except as marked or noted in the HPI; Constitutional: Negative for fever and chills. ; ; Eyes: Negative for eye pain, redness and discharge. ; ; ENMT: Negative for ear pain, hoarseness, nasal congestion, sinus pressure and sore throat. ; ; Cardiovascular: Negative for chest pain, palpitations, diaphoresis, dyspnea and peripheral edema. ; ; Respiratory: Negative for cough, wheezing and stridor. ; ; Gastrointestinal: +abd pain. Negative for nausea, vomiting, diarrhea, blood in stool,  hematemesis, jaundice and rectal bleeding. . ; ; Genitourinary: Negative for dysuria, flank pain and hematuria. ; ; Musculoskeletal: Negative for back pain and neck pain. Negative for swelling and trauma.; ; Skin: Negative for pruritus, rash, abrasions, blisters, bruising and skin lesion.; ; Neuro: Negative for headache, lightheadedness and neck stiffness. Negative for weakness, altered level of consciousness, altered mental status, extremity weakness, paresthesias, involuntary movement, seizure and syncope.      Physical Exam Updated Vital Signs BP (!) 73/52   Pulse 62   Resp (!) 27   Ht _0  (1.803 m)   Wt 110 lb (49.9 kg)   SpO2 (!) 81%   BMI 15.34 kg/m    Patient Vitals for the past 24 hrs:  BP Pulse Resp SpO2 Height Weight  08-28-16  1545 - - 20 - - -  September 18, 2016 1515 (!) 73/52 62 (!) 27 (!) 81 % - -  09-18-2016 1335 - - - - _0  (1.803 m) 110 lb (49.9 kg)     Physical Exam 1415: Physical examination:  Nursing notes reviewed; Vital signs and O2 SAT reviewed;  Constitutional: Cachetic, Uncomfortable appearing, crying out..; Head:  Normocephalic, atraumatic; Eyes: EOMI, PERRL, No scleral icterus; ENMT: Mouth and pharynx normal, Mucous membranes dry; Neck: Supple, Full range of motion, No lymphadenopathy; Cardiovascular: Regular rate and rhythm, No gallop; Respiratory: Breath sounds coarse & equal bilaterally, No wheezes.  Speaking full sentences, Normal respiratory effort/excursion; Chest: Nontender, Movement normal; Abdomen: Soft, +diffusely tender, decreased bowel sounds; Genitourinary: No CVA tenderness; Extremities: Pulses normal, No tenderness, No edema, No calf edema or asymmetry.; Neuro: AA&Ox3, Major CN grossly intact.  Speech clear. No gross focal motor deficits in extremities.; Skin: Color mottled, cool, Dry.   ED Treatments / Results  Labs (all labs ordered are listed, but only abnormal results are displayed)   EKG  EKG Interpretation None        Radiology   Procedures Procedures (including critical care time)  Medications Ordered in ED Medications  fentaNYL (SUBLIMAZE) injection 50 mcg (50 mcg Intravenous Given September 18, 2016 1327)  fentaNYL (SUBLIMAZE) injection 50 mcg (not administered)  HYDROmorphone (DILAUDID) injection 1 mg (1 mg Intravenous Given 09/18/2016 1443)  0.9 %  sodium chloride infusion (100 mL/hr Intravenous New Bag/Given 09-18-2016 1444)     Initial Impression / Assessment and Plan / ED Course  I have reviewed the triage vital signs and the nursing notes.  Pertinent labs & imaging results that were available during my care of the patient were reviewed by me and considered in my medical decision making (see chart for details).  MDM Reviewed: previous chart, nursing note and vitals Reviewed previous: labs Interpretation: labs and x-ray   Results for orders placed or performed during the hospital encounter of 09-18-16  Comprehensive metabolic panel  Result Value Ref Range   Sodium 127 (L) 135 - 145 mmol/L   Potassium 6.8 (HH) 3.5 - 5.1 mmol/L   Chloride 88 (L) 101 - 111 mmol/L   CO2 19 (L) 22 - 32 mmol/L   Glucose, Bld 75 65 - 99 mg/dL   BUN 64 (H) 6 - 20 mg/dL   Creatinine, Ser 1.42 (H) 0.61 - 1.24 mg/dL   Calcium 8.7 (L) 8.9 - 10.3 mg/dL   Total Protein 6.1 (L) 6.5 - 8.1 g/dL   Albumin 2.8 (L) 3.5 - 5.0 g/dL   AST 250 (H) 15 - 41 U/L   ALT 181 (H) 17 - 63 U/L   Alkaline Phosphatase 686 (H) 38 - 126 U/L   Total Bilirubin 1.4 (H) 0.3 - 1.2 mg/dL   GFR calc non Af Amer 51 (L) >60 mL/min   GFR calc Af Amer 59 (L) >60 mL/min   Anion gap 20 (H) 5 - 15  Lipase, blood  Result Value Ref Range   Lipase 14 11 - 51 U/L  Lactic acid, plasma  Result Value Ref Range   Lactic Acid, Venous 8.5 (HH) 0.5 - 1.9 mmol/L  CBC with Differential  Result Value Ref Range   WBC 23.5 (H) 4.0 - 10.5 K/uL   RBC 4.88 4.22 - 5.81 MIL/uL   Hemoglobin 15.9 13.0 - 17.0 g/dL   HCT 47.0 39.0 - 52.0 %   MCV 96.3 78.0 - 100.0 fL    MCH 32.6 26.0 - 34.0  pg   MCHC 33.8 30.0 - 36.0 g/dL   RDW 15.1 11.5 - 15.5 %   Platelets 152 150 - 400 K/uL   Neutrophils Relative % 91 %   Lymphocytes Relative 4 %   Monocytes Relative 5 %   Eosinophils Relative 0 %   Basophils Relative 0 %   Neutro Abs 21.4 (H) 1.7 - 7.7 K/uL   Lymphs Abs 0.9 0.7 - 4.0 K/uL   Monocytes Absolute 1.2 (H) 0.1 - 1.0 K/uL   Eosinophils Absolute 0.0 0.0 - 0.7 K/uL   Basophils Absolute 0.0 0.0 - 0.1 K/uL   WBC Morphology WHITE COUNT CONFIRMED ON SMEAR    Dg Chest Port 1 View Result Date: 09-13-2016 CLINICAL DATA:  Abdominal pain.  Pancreatic cancer. EXAM: PORTABLE CHEST 1 VIEW COMPARISON:  07/15/2016, 12/25/2014, chest CT 12/04/2015 FINDINGS: Mild cardiac enlargement. Negative for heart failure. Negative for infiltrate or effusion. Chronic scarring left apex. Port-A-Cath tip the lower SVC at the cavoatrial junction. Chronic right rib fracture IMPRESSION: Left upper lobe scarring.  No acute infiltrate or edema. Electronically Signed   By: Franchot Gallo M.D.   On: September 13, 2016 15:31    1515:  Pt confirms his DNR/DNI status. Pt given liberal doses of pain meds with very slow improvement of his pain. T/C to [place], NP: she came to bedside to speak with pt and family; goals of care clarified and agreed upon (comfort measures only, will not treat labs values as it will not change outcomes) and that death will likely occur soon. Pt is agreeable with this plan, as is family member at bedside. Pt spoke on phone with his pastor and prayed with him.  IV pain meds continue.   1605:  Pt slowly became more comfortable. TOD 1600. Family present and pleased with pt's level comfort and maintenance of dignity before his death. T/C to [facility] Heme/Onc Kirby Crigler, PA, case discussed, including:  HPI, pertinent PM/SHx, VS/PE, dx testing, ED course and treatment:  Agreeable to sign death certificate.     Final Clinical Impressions(s) / ED Diagnoses   Final  diagnoses:  Malignant neoplasm of pancreas, unspecified location of malignancy ([place])  Metastatic cancer [facility])    New Prescriptions New Prescriptions   No medications on file      Francine Graven, DO 08/23/16 1546

## 2016-09-02 NOTE — ED Triage Notes (Signed)
PT states he has pancreatic cancer and is supposed to have hospice with meds this coming Monday. PT states his pain worsened today and he has no more pain medications to take. PT hyperventilating and repetitively saying "please someone help me."

## 2016-09-02 NOTE — ED Notes (Signed)
CDS referral completed at this time with staff member: [facility]  Referral# [id number] [id number].  AC Winnie Jerrel made aware of pt status at this time. Funeral home of choice per his sister is [facility] of [place], [place].

## 2016-09-02 DEATH — deceased

## 2016-09-22 ENCOUNTER — Other Ambulatory Visit (HOSPITAL_COMMUNITY): Payer: Medicaid Other

## 2016-09-29 ENCOUNTER — Ambulatory Visit (HOSPITAL_COMMUNITY): Payer: Medicaid Other

## 2016-10-15 ENCOUNTER — Other Ambulatory Visit: Payer: Self-pay | Admitting: Nurse Practitioner

## 2017-07-24 IMAGING — CT CT ABD-PELV W/ CM
1 of 4 series · 12 of 32 positions shown, 17 images · IV contrast (iopamidol)
Comparison: CT dated 11/11/2015

CLINICAL DATA: 64-year-old male with abdominal pain.

EXAM:
CT ABDOMEN AND PELVIS WITH CONTRAST
TECHNIQUE: Multidetector CT imaging of the abdomen and pelvis was performed
using the standard protocol following bolus administration of
intravenous contrast.
CONTRAST:  100mL TNM5UX-FXX IOPAMIDOL (TNM5UX-FXX) INJECTION 61%

[Series 2: routine abd pel with · axial · 0.69mm/px · z∈[-582,-192]mm · 12 of 92 slices shown, 17 images]
[im 7/92  soft-tissue]
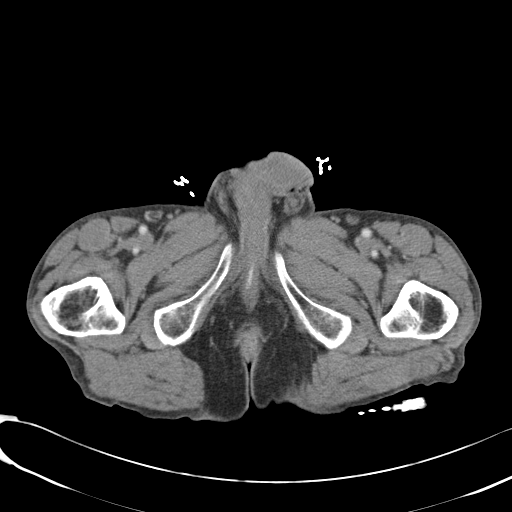
[im 7/92  bone]
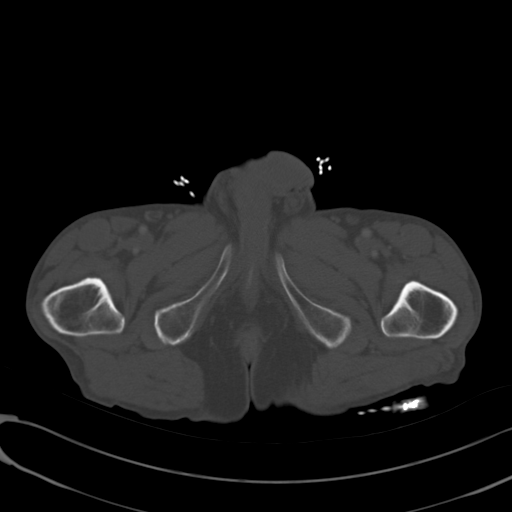
[im 14/92  soft-tissue]
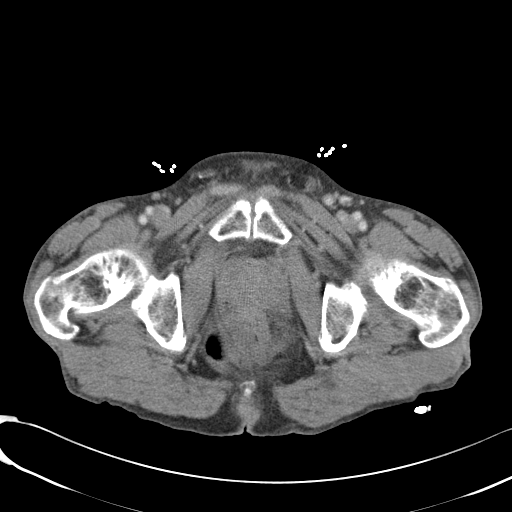
[im 20/92  soft-tissue]
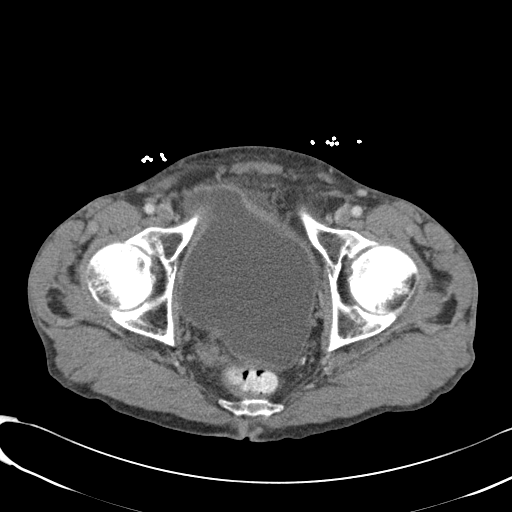
[im 33/92  soft-tissue]
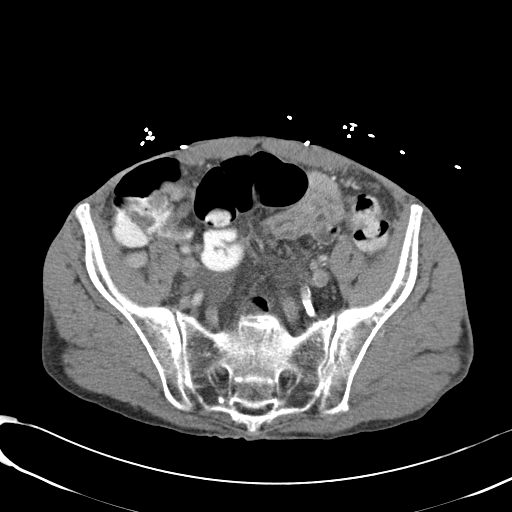
[im 40/92  soft-tissue]
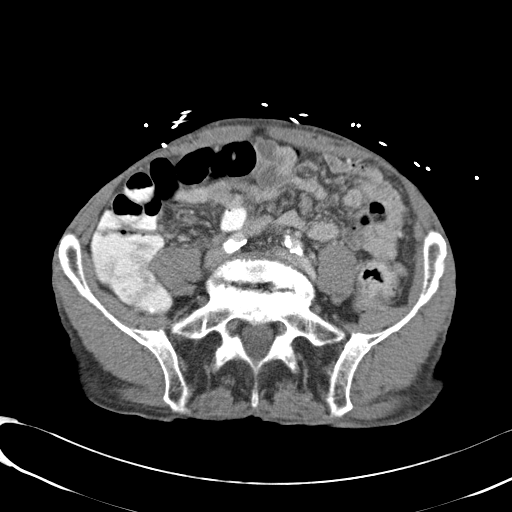
[im 46/92  soft-tissue]
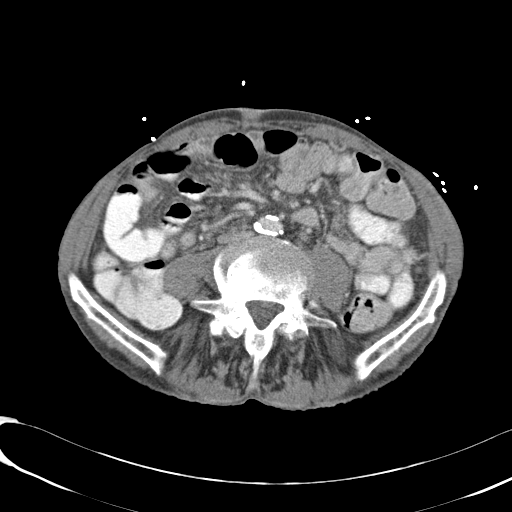
[im 53/92  soft-tissue]
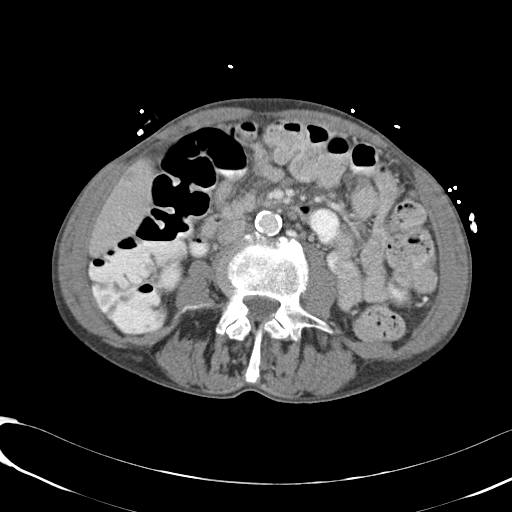
[im 59/92  soft-tissue]
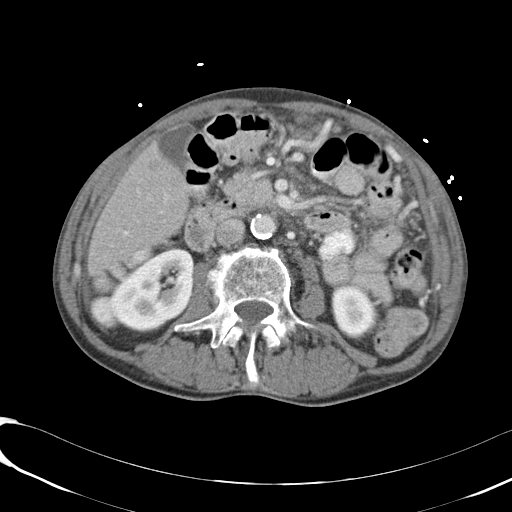
[im 66/92  lung]
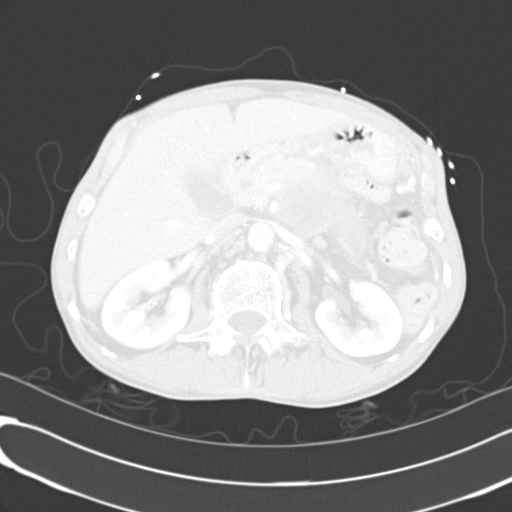
[im 72/92  soft-tissue]
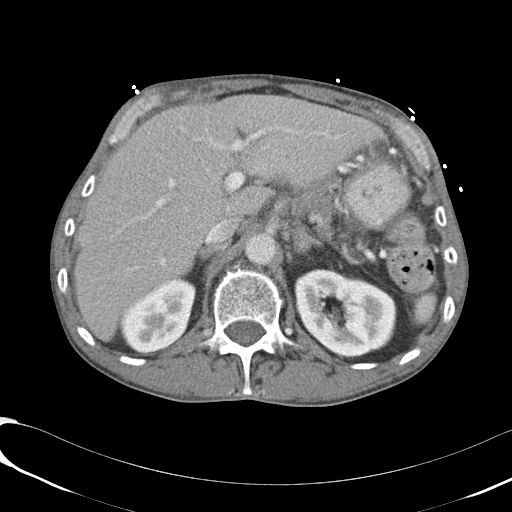
[im 72/92  lung]
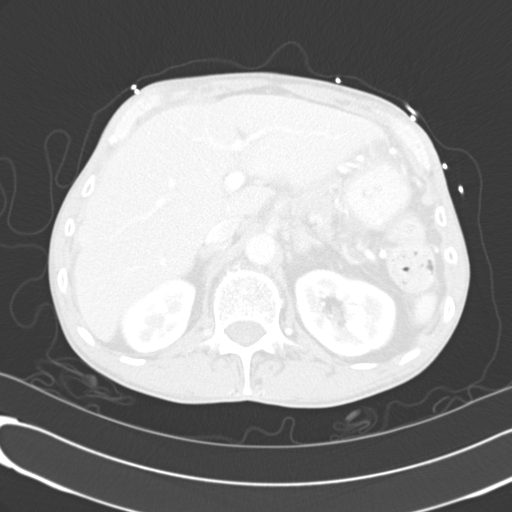
[im 72/92  bone]
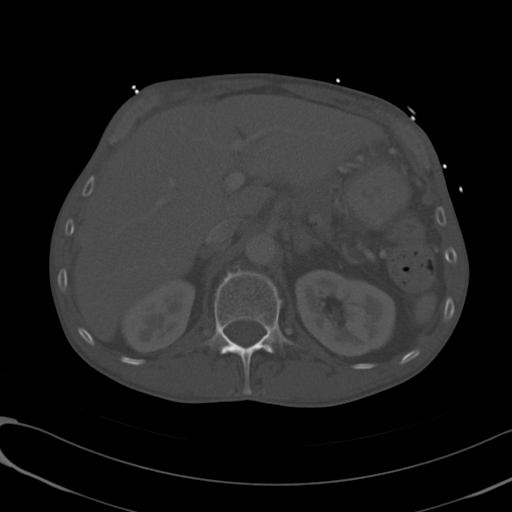
[im 79/92  soft-tissue]
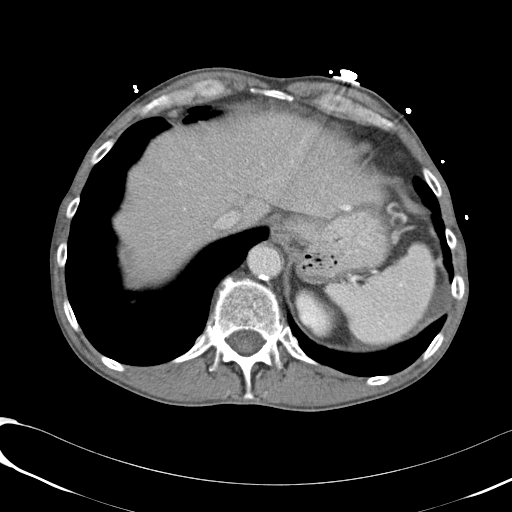
[im 79/92  lung]
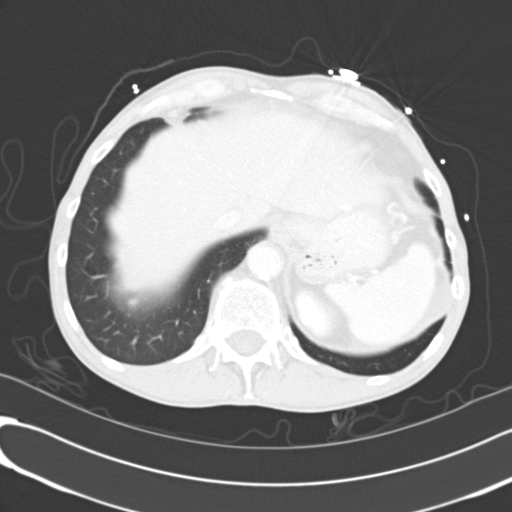
[im 85/92  soft-tissue]
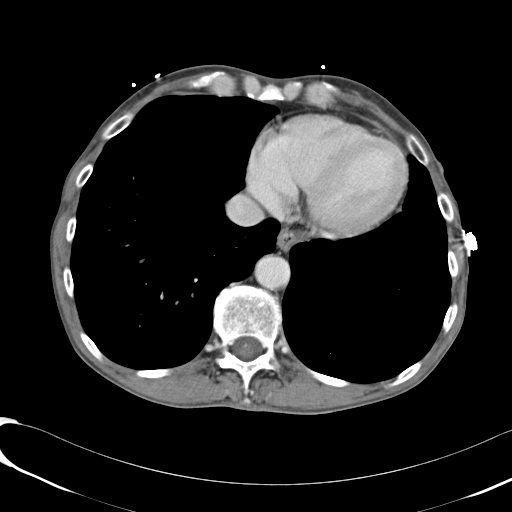
[im 85/92  lung]
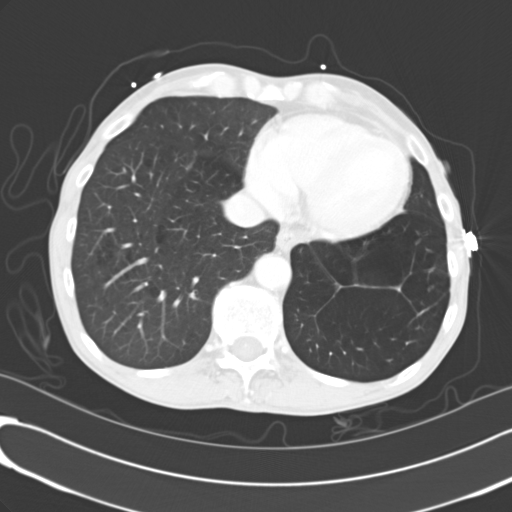

[12 of 32 positions shown; findings below may reference images not displayed]

FINDINGS: There is emphysematous changes of the lung bases. Focal area of
scarring and calcification noted at the left lung base. No
intra-abdominal free air. There is diffuse mesenteric stranding and
edema as well as small ascites, increased compared to the most
recent study of 11/11/2015.

There are two subcentimeter hypodense densities in the right lobe of
the liver inferiorly adjacent the gallbladder which are incompletely
characterized. Metastatic disease is not excluded. MRI without and
with contrast may provide better characterisation. A faint
subcentimeter hypodensity is also noted in the left lobe of the
liver is superiorly (series 2, image 12). There is mild irregularity
of the hepatic contour. The gallbladder appears unremarkable.

The head of the pancreas appears unremarkable. There is a large hypo
enhancing lesion in the body of the pancreas measuring approximately
4.3 x 4.0 cm grossly similar to prior study. Difference in
measurement between the 2 studies is likely related to measurement
technique. There is atrophy of the distal gland with dilatation of
the duct. This lesion most likely represents a primary pancreatic
malignancy. The spleen, adrenal glands, kidneys, visualized ureters,
and urinary bladder appear unremarkable. The prostate and seminal
vesicles are grossly unremarkable.

There is abutment of the distal duodenum at the ligament of Treitz
by the pancreatic mass. There is no evidence of bowel obstruction or
active inflammation. Moderate stool noted throughout the colon. The
appendix appears unremarkable.

There is advanced aortoiliac atherosclerotic disease. Focal area of
calcified atherosclerotic plaque inferior to the renal arteries with
moderate narrowing of the lumen of the aorta (series 8, image 17).
The origins of the celiac axis, SMA, and IMA appear patent. There is
abutment of the proximal aspect of the celiac artery as well as the
SMA by the pancreatic mass without definite evidence of encasement.
There is encasement of the splenic artery. There is compression of
the Rihiveli splenic confluence by the pancreatic mass as well as
narrowing of the central SMV. The SMV, and main portal vein however
remain patent. No the splenic vein is not visualized and appears to
be occluded by the mass. No portal venous gas identified.

Multiple small upper abdominal and mesenteric lymph nodes noted. A
nodular density posterior to the pancreatic mass and anterior to the
left renal vein noted measuring 7 mm in short axis (series 2, image
27).

There is osteopenia with degenerative changes of the spine. No acute
fracture. Probable small bone island and in the left acetabulum
anteriorly
IMPRESSION: Diffuse mesenteric edema and small ascites with interval increase in
the size of the free fluid compared to prior study.

Large hypo enhancing pancreatic mass as described most compatible
with primary pancreatic neoplasm. There is abutment of the celiac
axis, SMA and encasement of the splenic artery. The mass occludes
the splenic vein and compresses the Rihiveli splenic confluence

Abutment of the distal duodenum at the ligament of Treitz
peripancreatic mass. No bowel obstruction.

Faint subcentimeter hepatic hypodense lesions, not characterized.
Metastatic disease is not excluded. Further evaluation with MRI
without and with contrast recommended.
